# Patient Record
Sex: Female | Born: 1990 | Race: White | Hispanic: No | Marital: Single | Smoking: Current every day smoker
Health system: Southern US, Community
[De-identification: ages and names within clinical notes are randomized; demographics above are authoritative.]

## PROBLEM LIST (undated history)

## (undated) DIAGNOSIS — N63 Unspecified lump in unspecified breast: Secondary | ICD-10-CM

## (undated) HISTORY — DX: Unspecified lump in unspecified breast: N63.0

## (undated) NOTE — *Deleted (*Deleted)
    [facility]    Date of Admission:  06/15/2019      ID: Cambryn League is a 29 y.o. female with  MSSA disseminated disease with  Bacteremia, endocarditis, pulmonary septic emboli, splenic emboli and MSSA L knee septic arthritis -bacteremia cleared on 4/13 -- day 10 Active Problems:   Endocarditis   Hypokalemia   Septic arthritis (HCC)   Splenic infarct   Bacteremia due to methicillin susceptible Staphylococcus aureus (MSSA)   IV drug abuse (HCC)   Tobacco abuse   Positive hepatitis C antibody test    Subjective: Afebrile, feeling better, less left knee pain. She denies chest pain. Interested in getting home. She would be able to go back home to live with her mother, and sister. She has 3 young children under the age of 7  Medications:   Chlorhexidine Gluconate Cloth  6 each Topical Daily   enoxaparin (LOVENOX) injection  40 mg Subcutaneous Q24H   methadone  35 mg Oral Daily   nicotine  21 mg Transdermal Daily   saccharomyces boulardii  250 mg Oral BID    Objective: Vital signs in last 24 hours: Temp:  [98.3 F (36.8 C)-99.8 F (37.7 C)] 99.8 F (37.7 C) (04/22 1723) Pulse Rate:  [101-126] 123 (04/22 1723) Resp:  [16-19] 18 (04/22 1723) BP: (100-120)/(60-83) 115/73 (04/22 1723) SpO2:  [99 %-100 %] 100 % (04/22 1723)  Physical Exam  Constitutional:  oriented to person, place, and time. appears well-developed and well-nourished. No distress.  HENT: [place]/AT, PERRLA, no scleral icterus Mouth/Throat: Oropharynx is clear and moist. No oropharyngeal exudate.  Cardiovascular: Normal rate, regular rhythm and normal heart sounds. Exam reveals no gallop and no friction rub.  No murmur heard.  Pulmonary/Chest: Effort normal and breath sounds normal. No respiratory distress.  has no wheezes.  Neck = supple, no nuchal rigidity Abdominal: Soft. Bowel sounds are normal.  exhibits no distension. There is no tenderness.  Lymphadenopathy: no cervical adenopathy.  No axillary adenopathy Neurological: alert and oriented to person, place, and time.  Skin: Skin is warm and dry. No rash noted. No erythema.  Psychiatric: a normal mood and affect.  behavior is normal.    Lab Results Recent Labs    06/23/19 0831 06/24/19 0403  WBC 12.5* 11.1*  HGB 9.5* 9.7*  HCT 30.0* 30.8*  NA 139 141  K 3.8 3.5  CL 105 106  CO2 26 26  BUN 10 11  CREATININE 0.54 0.51    Microbiology: 4/13 blood cx NGTD Studies/Results: No results found.   Assessment/Plan:  currently day 10 since clearing bacteremia. Using 4/13 as day 1. Recommend that we re-evaluate on Monday, day 14, if can consider to be part of getting daily infusion via picc line, for management of endocarditis Vs if she is willing to stay through 4 wk, then finish with outpatient IV management  Opiate dependence = continue on methadone 35mg daily  Will d/c probiotics Cynthia Snider [facility] Cell: [phone] Pager: [phone]  06/25/2019, 5:34 PM      

## (undated) NOTE — *Deleted (*Deleted)
PROGRESS NOTE    Michelle Doyle  MRN:[id number] DOB: 06/01/1990 DOA: 06/15/2019 PCP: Patient, No Pcp Per    Chief Complaint  Patient presents with   Abnormal Lab    Brief Narrative: (Start on day 1 of progress note - keep it brief and live) 28 year old with PMH significant for IV heroin abuse, Untreated Hepatitis C, initially admitted for acute heroin withdrawal and sepsis. Patient was transfer to [place] 4/12 for TEE, but it was cancel due to severe anemia, Hb at 6. Patient Decided left AMA from [facility] Her Grandfather brought her back to [place] [facility].    Patient was found to have disseminated MSSA Bacteremia, mitral valve and tricuspid valve endocarditis, pulmonary septic emboli, splenic emboli, probable septic arthritis of the left knee.  ID following.    Assessment & Plan:   Active Problems:   Endocarditis   Hypokalemia   Septic arthritis (HCC)   Splenic infarct   Bacteremia due to methicillin susceptible Staphylococcus aureus (MSSA)   IV drug abuse (HCC)   Tobacco abuse   Positive hepatitis C antibody test  1 disseminated MSSA bacteremia, POA Patient noted to have disseminated MSSA bacteremia with pulmonary septic emboli, splenic emboli, probable septic arthritis of the left knee, tricuspid and mitral valve endocarditis.  MRI of the brain was obtained incomplete study patient refused to complete the study however no significant abnormality detected.  2D echo done with thickening tricuspid valve 1.1 x 1.7 cm vegetation, tricuspid valve mild to moderate regurgitation, thickened mitral valve cannot rule out vegetation, abnormal mitral valve.  Blood cultures from 06/11/2019 were growing staph aureus.  Repeat blood cultures 06/13/2019 with cocci in clusters.  Repeat cultures obtained 06/16/2019 with no growth to date.  Continue IV nafcillin will likely need total of 6 weeks of IV antibiotics.  ID following and appreciate input and recommendations.  2.  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  Patient with MSSA bacteremia.  Continue IV nafcillin.  Per ID.  3.  Septic emboli/splenic infarct Continue IV nafcillin.  We will give a dose of Lasix 40 mg IV x1.  4.  Left knee septic arthritis Status post I&D of the left knee.  Continue IV nafcillin.  Pain management.  Will ask orthopedics to reassess knee tomorrow.  Supportive care.  5.  Opiate abuse Patient uses IV heroin.  Patient states was at the methadone clinic and was getting methadone 35 mg daily and requesting her current dose be increased.  Patient currently on methadone 10 mg twice a day.  Pharmacy contacted methadone clinic and per pharmacist patient noted not to be in the system although patient adamant she goes there.  Patient asking for methadone dose to be increased back to 35 mg daily.  Methadone has been increased to 35 mg daily,  as patient with complaints of hot flashes, chills.  Continue the clonidine detox protocol. Continue current regimen of Percocet for pain as needed.  Will not escalate pain medication.  6.  Untreated/positive hepatitis C HCV RNA pending.  ID following.  7.  hypokalemia Repleted.  Potassium at 3.9.  Magnesium at 2.0.   8.  Tobacco abuse Continue nicotine patch.    9.  Thrombocytopenia Resolved.  10.  Iron deficiency anemia Status post 2 units packed red blood cells on 06/15/2019.  Hemoglobin currently stable at 9.9.  Will likely   need oral iron supplementation on discharge.  May consider IV iron during this hospitalization however will discuss with ID on Monday.    DVT prophylaxis: Lovenox Code Status: Full Family Communication: Updated patient.  No family at bedside.  Disposition:   Status is: Inpatient    Dispo: The patient is from: Home              Anticipated d/c is to: Likely home               Anticipated d/c date is: In approximately 5 weeks.              Patient currently on IV nafcillin and will need to complete 6 weeks of therapy prior to discharge as patient with IV drug abuse.        Consultants:  Infectious disease: Dr. Snider  Procedures:  MRI brain from 06/16/2019 PICC line  Antimicrobials:  IV nafcillin 06/15/2019 (D8/42) >>>>>   Subjective: Patient in bed.  Patient noted to have had a T-max of 100.8 earlier this morning.  Patient with some complaints of sacral pain.  Patient denies any discharge.  Patient concerned that she may have a UTI although she denies any significant dysuria.   Objective: Vitals:   06/20/19 2101 06/21/19 0514 06/21/19 0815 06/21/19 0937  BP: 117/72 123/65 107/68 118/77  Pulse: (!) 110 (!) 113 96 95  Resp: 18 18 19 18  Temp: 99 F (37.2 C) (!) 100.8 F (38.2 C) 99.5 F (37.5 C) 98.7 F (37.1 C)  TempSrc: Oral Oral Oral Oral  SpO2: 98% 97% 97% 96%  Weight:      Height:        Intake/Output Summary (Last 24 hours) at 06/21/2019 1304 Last data filed at 06/20/2019 1800 Gross per 24 hour  Intake 388.7 ml  Output 2300 ml  Net -1911.3 ml   Filed Weights   06/16/19 0110 06/16/19 0816  Weight: 49.9 kg 65.2 kg    Examination:  General exam: NAD Respiratory system: Decreased scattered coarse breath sounds/crackles.  Fair air movement.  Speaking in full sentences. Cardiovascular system: RRR no murmurs rubs or gallops.  No JVD.  No lower extremity edema.  Gastrointestinal system: Abdomen is soft, nontender, nondistended, positive bowel sounds.  No rebound.  No guarding. Central nervous system: Alert and oriented. No focal neurological deficits. Extremities: Left knee with dressing noted, no significant surrounding erythema.  Skin: No rashes, lesions or ulcers Psychiatry: Judgement and insight appear normal. Mood & affect appropriate.     Data Reviewed: I have personally reviewed following labs and imaging  studies  CBC: Recent Labs  Lab 06/15/19 1421 06/16/19 0500 06/17/19 1211 06/18/19 1032 06/19/19 1051 06/20/19 1107 06/21/19 0143  WBC 19.5*   < > 20.0* 17.3* 19.0* 17.5* 17.6*  NEUTROABS 16.3*  --  15.5* 13.7* 15.4* 13.7*  --   HGB 6.8*   < > 10.5* 10.6* 9.6* 9.5* 9.9*  HCT 20.9*   < > 30.8* 31.3* 29.6* 29.6* 30.2*  MCV 80.4   < > 80.6 82.6 85.8 86.3 86.0  PLT 232   < > 293 308 331 401* 466*   < > = values in this interval not displayed.    Basic Metabolic Panel: Recent Labs  Lab 06/17/19 1211 06/18/19 1032 06/19/19 1051 06/20/19 1107 06/21/19 0143  NA 139 140 142 139 137  K 3.0* 3.4* 3.5 3.4* 3.9  CL 108 109 108 107 103  CO2 23 24 26 28 27  GLUCOSE 91   111* 100* 103* 121*  BUN <5* <5* 6 7 9  CREATININE 0.45 0.46 0.46 0.46 0.57  CALCIUM 7.3* 7.4* 7.8* 7.7* 7.9*  MG 1.7 2.0 2.0 1.8 2.0    GFR: Estimated Creatinine Clearance: 103.6 mL/min (by C-G formula based on SCr of 0.57 mg/dL).  Liver Function Tests: Recent Labs  Lab 06/16/19 0500 06/17/19 1211  AST 20 18  ALT 11 12  ALKPHOS 72 61  BILITOT 0.9 0.8  PROT 6.7 6.4*  ALBUMIN 1.8* 1.6*    CBG: No results for input(s): GLUCAP in the last 168 hours.   Recent Results (from the past 240 hour(s))  Body fluid culture     Status: None   Collection Time: 06/11/19  2:03 PM   Specimen: Synovium; Synovial Fluid  Result Value Ref Range Status   Specimen Description SYNOVIAL FLUID KNEE LEFT  Final   Special Requests   Final    NONE Performed at [facility], [address] [place], [place] [address]    Gram Stain   Final    ABUNDANT WBC PRESENT,BOTH PMN AND MONONUCLEAR NO ORGANISMS SEEN Gram Stain Report Called to,Read Back By and Verified With: DR. SWINTECK AT 1512 ON 06/11/19 BY N.Wandy Bossler Performed at [facility], [address] [place], [place] [address]    Culture   Final    NO GROWTH 3 DAYS Performed at [facility], [address] [place], [place]  [address]    Report Status 06/14/2019 FINAL  Final  Anaerobic culture     Status: None   Collection Time: 06/11/19  2:03 PM   Specimen: Synovium; Synovial Fluid  Result Value Ref Range Status   Specimen Description SYNOVIAL FLUID KNEE LEFT  Final   Special Requests   Final    NONE Performed at [facility], [address] [place], [place] [address]    Culture   Final    NO ANAEROBES ISOLATED Performed at [facility], [address] [place], [place] [address]    Report Status 06/16/2019 FINAL  Final  Aerobic/Anaerobic Culture (surgical/deep wound)     Status: None   Collection Time: 06/11/19  6:30 PM   Specimen: Synovial, Left Knee; Body Fluid  Result Value Ref Range Status   Specimen Description   Final    SYNOVIAL KNEE LEFT Performed at [facility], [address] [place], [place] [address]    Special Requests   Final    NONE Performed at [facility], [address] [place], [place] [address]    Gram Stain   Final    RARE WBC PRESENT, PREDOMINANTLY PMN NO ORGANISMS SEEN    Culture   Final    RARE STAPHYLOCOCCUS AUREUS NO ANAEROBES ISOLATED Performed at [facility], [address] [place], [place] [address]    Report Status 06/16/2019 FINAL  Final   Organism ID, Bacteria STAPHYLOCOCCUS AUREUS  Final      Susceptibility   Staphylococcus aureus - MIC*    CIPROFLOXACIN >=8 RESISTANT Resistant     ERYTHROMYCIN >=8 RESISTANT Resistant     GENTAMICIN <=0.5 SENSITIVE Sensitive     OXACILLIN 0.5 SENSITIVE Sensitive     TETRACYCLINE <=1 SENSITIVE Sensitive     VANCOMYCIN <=0.5 SENSITIVE Sensitive     TRIMETH/SULFA <=10 SENSITIVE Sensitive     CLINDAMYCIN <=0.25 SENSITIVE Sensitive     RIFAMPIN <=0.5 SENSITIVE Sensitive     Inducible Clindamycin NEGATIVE Sensitive     *   RARE STAPHYLOCOCCUS AUREUS  Aerobic/Anaerobic Culture (surgical/deep wound)     Status: None   Collection Time: 06/11/19  6:32 PM    Specimen: Synovium; Tissue  Result Value Ref Range Status   Specimen Description   Final    SYNOVIAL TISSUE KNEE LEFT Performed at [facility], [address] [place], [place] [address]    Special Requests   Final    NONE Performed at [facility], [address] [place], [place] [address]    Gram Stain NO WBC SEEN NO ORGANISMS SEEN   Final   Culture   Final    RARE STAPHYLOCOCCUS AUREUS NO ANAEROBES ISOLATED Performed at [facility], [address] [place], [place] [address]    Report Status 06/17/2019 FINAL  Final   Organism ID, Bacteria STAPHYLOCOCCUS AUREUS  Final      Susceptibility   Staphylococcus aureus - MIC*    CIPROFLOXACIN >=8 RESISTANT Resistant     ERYTHROMYCIN >=8 RESISTANT Resistant     GENTAMICIN <=0.5 SENSITIVE Sensitive     OXACILLIN 0.5 SENSITIVE Sensitive     TETRACYCLINE <=1 SENSITIVE Sensitive     VANCOMYCIN 1 SENSITIVE Sensitive     TRIMETH/SULFA <=10 SENSITIVE Sensitive     CLINDAMYCIN <=0.25 SENSITIVE Sensitive     RIFAMPIN <=0.5 SENSITIVE Sensitive     Inducible Clindamycin NEGATIVE Sensitive     * RARE STAPHYLOCOCCUS AUREUS  Surgical PCR screen     Status: Abnormal   Collection Time: 06/11/19  8:49 PM   Specimen: Nasal Mucosa; Nasal Swab  Result Value Ref Range Status   MRSA, PCR NEGATIVE NEGATIVE Final   Staphylococcus aureus POSITIVE (A) NEGATIVE Final    Comment: (NOTE) The Xpert SA Assay (FDA approved for NASAL specimens in patients 22 years of age and older), is one component of a comprehensive surveillance program. It is not intended to diagnose infection nor to guide or monitor treatment. Performed at [facility], [address] [place], [place] [address]   Culture, blood (routine x 2)     Status: Abnormal   Collection Time: 06/13/19 11:53 AM   Specimen: BLOOD LEFT HAND  Result Value Ref Range Status   Specimen Description   Final    BLOOD LEFT HAND Performed  at [facility], [address] [place], [place] [address]    Special Requests   Final    BOTTLES DRAWN AEROBIC AND ANAEROBIC Blood Culture adequate volume Performed at  [facility], [address] [place], [place] [address]    Culture  Setup Time   Final    GRAM POSITIVE COCCI IN CLUSTERS AEROBIC BOTTLE ONLY CRITICAL RESULT CALLED TO, READ BACK BY AND VERIFIED WITH: PHARMD C SHADE [id number] AT 954 AM BY CM    Culture (A)  Final    STAPHYLOCOCCUS AUREUS SUSCEPTIBILITIES PERFORMED ON PREVIOUS CULTURE WITHIN THE LAST 5 DAYS. Performed at  [facility], [address] [place], [place] [address]    Report Status 06/15/2019 FINAL  Final  Culture, blood (routine x 2)     Status: Abnormal   Collection Time: 06/13/19 11:53 AM   Specimen: BLOOD RIGHT HAND  Result Value Ref Range Status   Specimen Description   Final    BLOOD RIGHT HAND Performed at  [facility], [address] [place], [place] [address]    Special Requests   Final    BOTTLES DRAWN AEROBIC AND ANAEROBIC Blood Culture results may   not be optimal due to an inadequate volume of blood received in culture bottles Performed at [facility], [address] [place], [place] [address]    Culture  Setup Time   Final    GRAM POSITIVE COCCI IN CLUSTERS AEROBIC BOTTLE ONLY CRITICAL RESULT CALLED TO, READ BACK BY AND VERIFIED WITH: PHARMD C SHADE [id number] AT 954 AM BY CM    Culture (A)  Final    STAPHYLOCOCCUS AUREUS SUSCEPTIBILITIES PERFORMED ON PREVIOUS CULTURE WITHIN THE LAST 5 DAYS. Performed at [facility], [address] [place], [place] [address]    Report Status 06/15/2019 FINAL  Final  Culture, blood (Routine X 2) w Reflex to ID Panel     Status: None   Collection Time: 06/16/19  4:33 PM   Specimen: BLOOD LEFT HAND  Result Value Ref Range Status   Specimen Description   Final    BLOOD LEFT HAND Performed at [facility], [address] [place], [place] [address]    Special Requests   Final    BOTTLES DRAWN AEROBIC ONLY Blood Culture adequate volume Performed at [facility], [address] [place], [place] [address]    Culture   Final    NO GROWTH 5 DAYS Performed at [facility], [address] [place], [place] [address]    Report Status 06/21/2019 FINAL  Final  Culture, blood (Routine X 2) w Reflex to ID Panel     Status: None   Collection Time: 06/16/19  4:40 PM   Specimen: BLOOD LEFT HAND  Result Value Ref Range Status   Specimen Description   Final    BLOOD LEFT HAND Performed at [facility], [address] [place], [place] [address]    Special Requests   Final    BOTTLES DRAWN AEROBIC ONLY Blood Culture results may not be optimal due to an inadequate volume of blood received in culture bottles Performed at [facility], [address] [place], [place] [address]    Culture   Final    NO GROWTH 5 DAYS Performed at [facility], [address] [place], [place] [address]    Report Status 06/21/2019 FINAL  Final         Radiology Studies: No results found.      Scheduled Meds:  Chlorhexidine Gluconate Cloth  6 each Topical Daily   enoxaparin (LOVENOX) injection  40 mg Subcutaneous Q24H   methadone  35 mg Oral Daily   nicotine  21 mg Transdermal Daily   saccharomyces boulardii  250 mg Oral BID   sodium chloride flush  10-40 mL Intracatheter Q12H   Continuous Infusions:  nafcillin (NAFCIL) continuous infusion 12 g (06/20/19 1555)     LOS: 6 days    Time spent: 35 minutes    Kia Stavros, MD [facility]   To contact the attending provider between 7A-7P or the covering provider during after hours 7P-7A, please log into the web site [email or web address] and access using universal [place] password for that web site. If you do not have the password, please call the hospital  operator.  06/21/2019, 1:04 PM   

## (undated) NOTE — *Deleted (*Deleted)
PROGRESS NOTE    Michelle Doyle  MRN:[id number] DOB: 07/18/1990 DOA: 06/15/2019 PCP: Patient, No Pcp Per    Chief Complaint  Patient presents with   Abnormal Lab    Brief Narrative: (Start on day 1 of progress note - keep it brief and live) 28 year old with PMH significant for IV heroin abuse, Untreated Hepatitis C, initially admitted for acute heroin withdrawal and sepsis. Patient was transfer to [facility] 4/12 for TEE, but it was cancel due to severe anemia, Hb at 6. Patient Decided left AMA from [facility] Her Grandfather brought her back to [facility].    Patient was found to have disseminated MSSA Bacteremia, mitral valve and tricuspid valve endocarditis, pulmonary septic emboli, splenic emboli, probable septic arthritis of the left knee.  ID following.    Assessment & Plan:   Active Problems:   Endocarditis   Hypokalemia   Septic arthritis (HCC)   Splenic infarct   Bacteremia due to methicillin susceptible Staphylococcus aureus (MSSA)   IV drug abuse (HCC)   Tobacco abuse   Positive hepatitis C antibody test  1 disseminated MSSA bacteremia, POA Patient noted to have disseminated MSSA bacteremia with pulmonary septic emboli, splenic emboli, probable septic arthritis of the left knee, tricuspid and mitral valve endocarditis.  MRI of the brain was obtained incomplete study patient refused to complete the study however no significant abnormality detected.  2D echo done with thickening tricuspid valve 1.1 x 1.7 cm vegetation, tricuspid valve mild to moderate regurgitation, thickened mitral valve cannot rule out vegetation, abnormal mitral valve.  Blood cultures from 06/11/2019 were growing staph aureus.  Repeat blood cultures 06/13/2019 with cocci in clusters.  Repeat cultures obtained 06/16/2019 with no growth to date.  Continue IV nafcillin will likely need total of 6 weeks of IV antibiotics.  ID following and appreciate input and recommendations.  2.  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  Patient with MSSA bacteremia.  Continue IV nafcillin.  Per ID.  3.  Septic emboli/splenic infarct Continue IV nafcillin.  4.  Left knee septic arthritis Status post I&D of the left knee.  Continue IV nafcillin.  Pain management.  Will ask orthopedics to reassess knee tomorrow.  Supportive care.  5.  Opiate abuse Patient uses IV heroin.  Patient states was at the methadone clinic and was getting methadone 35 mg daily and requesting her current dose be increased.  Patient currently on methadone 10 mg twice a day.  Pharmacy contacted methadone clinic and per pharmacist patient noted not to be in the system although patient adamant she goes there.  Patient asking for methadone dose to be increased back to 35 mg daily.  Increase methadone to 35 mg daily as patient with complaints of hot flashes, chills.  Continue the clonidine detox protocol. Continue current regimen of Percocet for pain as needed.    6.  Untreated/positive hepatitis C HCV RNA pending.  ID following.  7.  hypokalemia Potassium at 3.4 this morning.  K. Dur 40 mEq p.o. x1.  Magnesium currently at 1.8.  Follow.    8.  Tobacco abuse Nicotine patch.    9.  Thrombocytopenia Resolved.  10.  Iron deficiency anemia Status post 2 units packed red blood cells on 06/15/2019.  Hemoglobin currently stable at 9.5.  Will likely need oral iron supplementation on discharge.  May consider   IV iron during this hospitalization however will discuss with ID on Monday.    DVT prophylaxis: Lovenox Code Status: Full Family Communication: Updated patient.  No family at bedside.  Disposition:   Status is: Inpatient    Dispo: The patient is from: Home              Anticipated d/c is to: Likely home              Anticipated d/c date is: In approximately 5 weeks.               Patient currently on IV nafcillin and will need to complete 6 weeks of therapy prior to discharge as patient with IV drug abuse.        Consultants:  Infectious disease: Dr. Snider  Procedures:  MRI brain from 06/16/2019 PICC line  Antimicrobials:  IV nafcillin 06/15/2019 (D8/42) >>>>>   Subjective: Patient in bed.  States she had a fever overnight and noted to have a T-max of 101.6.  Still with some chills.  Denies any nausea or vomiting.  Tolerating oral intake.  Denies any significant shortness of breath.  Denies any chest pain.  Appreciate above methadone dose being increased to 35 mg this morning.    Objective: Vitals:   06/19/19 1544 06/19/19 2030 06/20/19 0435 06/20/19 0512  BP: 112/74 128/81 (!) 88/68 113/69  Pulse: 98 (!) 110 (!) 113 (!) 108  Resp: 20 18 18   Temp: 98.8 F (37.1 C) 99.9 F (37.7 C) (!) 101.6 F (38.7 C) (!) 100.7 F (38.2 C)  TempSrc: Oral Oral Oral Oral  SpO2: 100% 96% 97% 96%  Weight:      Height:        Intake/Output Summary (Last 24 hours) at 06/20/2019 1304 Last data filed at 06/19/2019 1400 Gross per 24 hour  Intake 240 ml  Output --  Net 240 ml   Filed Weights   06/16/19 0110 06/16/19 0816  Weight: 49.9 kg 65.2 kg    Examination:  General exam: NAD Respiratory system: Some scattered coarse breath sounds/crackles fair air movement.   Cardiovascular system: Regular rate rhythm no murmurs rubs or gallops.  No JVD.  No lower extremity edema. Gastrointestinal system: Abdomen is nontender, nondistended, soft, positive bowel sounds.  No rebound.  No guarding.  Central nervous system: Alert and oriented. No focal neurological deficits. Extremities: Left knee bandaged.  Skin: No rashes, lesions or ulcers Psychiatry: Judgement and insight appear normal. Mood & affect appropriate.     Data Reviewed: I have personally reviewed following labs and imaging studies  CBC: Recent Labs  Lab 06/15/19 1421 06/15/19 1421 06/16/19 0500  06/17/19 1211 06/18/19 1032 06/19/19 1051 06/20/19 1107  WBC 19.5*   < > 19.5* 20.0* 17.3* 19.0* 17.5*  NEUTROABS 16.3*  --   --  15.5* 13.7* 15.4* 13.7*  HGB 6.8*   < > 11.7* 10.5* 10.6* 9.6* 9.5*  HCT 20.9*   < > 35.7* 30.8* 31.3* 29.6* 29.6*  MCV 80.4   < > 84.8 80.6 82.6 85.8 86.3  PLT 232   < > 268 293 308 331 401*   < > = values in this interval not displayed.    Basic Metabolic Panel: Recent Labs  Lab 06/15/19 1421 06/15/19 1520 06/16/19 0500 06/17/19 1211 06/18/19 1032 06/19/19 1051 06/20/19 1107  NA   < >  --  140 139 140 142 139  K   < >  --  3.9 3.0* 3.4* 3.5 3.4*    CL   < >  --  108 108 109 108 107  CO2   < >  --  22 23 24 26 28  GLUCOSE   < >  --  102* 91 111* 100* 103*  BUN   < >  --  <5* <5* <5* 6 7  CREATININE   < >  --  0.51 0.45 0.46 0.46 0.46  CALCIUM   < >  --  7.5* 7.3* 7.4* 7.8* 7.7*  MG  --  2.0  --  1.7 2.0 2.0 1.8   < > = values in this interval not displayed.    GFR: Estimated Creatinine Clearance: 103.6 mL/min (by C-G formula based on SCr of 0.46 mg/dL).  Liver Function Tests: Recent Labs  Lab 06/16/19 0500 06/17/19 1211  AST 20 18  ALT 11 12  ALKPHOS 72 61  BILITOT 0.9 0.8  PROT 6.7 6.4*  ALBUMIN 1.8* 1.6*    CBG: No results for input(s): GLUCAP in the last 168 hours.   Recent Results (from the past 240 hour(s))  Culture, blood (Routine x 2)     Status: Abnormal   Collection Time: 06/11/19 12:05 AM   Specimen: BLOOD  Result Value Ref Range Status   Specimen Description   Final    BLOOD BLOOD LEFT HAND Performed at [facility], [address] [place], [place] [address]    Special Requests   Final    BOTTLES DRAWN AEROBIC ONLY Blood Culture adequate volume Performed at [facility], [address] [place], [place] [address]    Culture  Setup Time   Final    GRAM POSITIVE COCCI IN CLUSTERS AEROBIC BOTTLE ONLY CRITICAL VALUE NOTED.  VALUE IS CONSISTENT WITH PREVIOUSLY REPORTED AND  CALLED VALUE.    Culture (A)  Final    STAPHYLOCOCCUS AUREUS SUSCEPTIBILITIES PERFORMED ON PREVIOUS CULTURE WITHIN THE LAST 5 DAYS. Performed at [facility], [address] [place], [place] [address]    Report Status 06/13/2019 FINAL  Final  Urine culture     Status: Abnormal   Collection Time: 06/11/19 12:05 AM   Specimen: In/Out Cath Urine  Result Value Ref Range Status   Specimen Description   Final    IN/OUT CATH URINE Performed at [facility], [address] [place], [place] [address]    Special Requests   Final    NONE Performed at [facility], [address] [place], [place] [address]    Culture (A)  Final    >=100,000 COLONIES/mL KOCURIA SPECIES 10,000 COLONIES/mL STAPHYLOCOCCUS AUREUS Standardized susceptibility testing for this organism is not available. FOR KOCURIA SPECIES Performed at [facility], [address] , [place] [address]    Report Status 06/13/2019 FINAL  Final   Organism ID, Bacteria STAPHYLOCOCCUS AUREUS (A)  Final      Susceptibility   Staphylococcus aureus - MIC*    CIPROFLOXACIN >=8 RESISTANT Resistant     GENTAMICIN <=0.5 SENSITIVE Sensitive     NITROFURANTOIN <=16 SENSITIVE Sensitive     OXACILLIN 0.5 SENSITIVE Sensitive     TETRACYCLINE <=1 SENSITIVE Sensitive     VANCOMYCIN 1 SENSITIVE Sensitive     TRIMETH/SULFA <=10 SENSITIVE Sensitive     CLINDAMYCIN <=0.25 SENSITIVE Sensitive     RIFAMPIN <=0.5 SENSITIVE Sensitive     Inducible Clindamycin NEGATIVE Sensitive     * 10,000 COLONIES/mL STAPHYLOCOCCUS AUREUS  Culture, blood (Routine x 2)       Status: Abnormal   Collection Time: 06/11/19 12:10 AM   Specimen: BLOOD  Result Value Ref Range Status   Specimen Description   Final    BLOOD BLOOD RIGHT FOREARM Performed at [facility], [address] [place], [place] [address]    Special Requests   Final    BOTTLES DRAWN AEROBIC ONLY Blood Culture adequate  volume [facility] at  [facility], [address] [place], [place] [address]    Culture  Setup Time   Final    GRAM POSITIVE COCCI IN CLUSTERS AEROBIC BOTTLE ONLY CRITICAL RESULT CALLED TO, READ BACK BY AND VERIFIED WITH: PHARMD MICHELLE B. [id number] FCP Performed at [facility], [address] [place], [place] [address]    Culture STAPHYLOCOCCUS AUREUS (A)  Final   Report Status 06/13/2019 FINAL  Final   Organism ID, Bacteria STAPHYLOCOCCUS AUREUS  Final      Susceptibility   Staphylococcus aureus - MIC*    CIPROFLOXACIN >=8 RESISTANT Resistant     ERYTHROMYCIN >=8 RESISTANT Resistant     GENTAMICIN <=0.5 SENSITIVE Sensitive     OXACILLIN 0.5 SENSITIVE Sensitive     TETRACYCLINE <=1 SENSITIVE Sensitive     VANCOMYCIN 1 SENSITIVE Sensitive     TRIMETH/SULFA <=10 SENSITIVE Sensitive     CLINDAMYCIN <=0.25 SENSITIVE Sensitive     RIFAMPIN <=0.5 SENSITIVE Sensitive     Inducible Clindamycin NEGATIVE Sensitive     * STAPHYLOCOCCUS AUREUS  Blood Culture ID Panel (Reflexed)     Status: Abnormal   Collection Time: 06/11/19 12:10 AM  Result Value Ref Range Status   Enterococcus species NOT DETECTED NOT DETECTED Final   Listeria monocytogenes NOT DETECTED NOT DETECTED Final   Staphylococcus species DETECTED (A) NOT DETECTED Final    Comment: CRITICAL RESULT CALLED TO, READ BACK BY AND VERIFIED WITH: PHARMD MICHELLE B. [id number] FCP    Staphylococcus aureus (BCID) DETECTED (A) NOT DETECTED Final    Comment: Methicillin (oxacillin) susceptible Staphylococcus aureus (MSSA). Preferred therapy is anti staphylococcal beta lactam antibiotic (Cefazolin or Nafcillin), unless clinically contraindicated. CRITICAL RESULT CALLED TO, READ BACK BY AND VERIFIED WITH: PHARMD MICHELLE B. [phone] [id number] FCP    Methicillin resistance NOT DETECTED NOT DETECTED Final   Streptococcus species NOT DETECTED NOT DETECTED Final   Streptococcus agalactiae NOT DETECTED NOT DETECTED  Final   Streptococcus pneumoniae NOT DETECTED NOT DETECTED Final   Streptococcus pyogenes NOT DETECTED NOT DETECTED Final   Acinetobacter baumannii NOT DETECTED NOT DETECTED Final   Enterobacteriaceae species NOT DETECTED NOT DETECTED Final   Enterobacter cloacae complex NOT DETECTED NOT DETECTED Final   Escherichia coli NOT DETECTED NOT DETECTED Final   Klebsiella oxytoca NOT DETECTED NOT DETECTED Final   Klebsiella pneumoniae NOT DETECTED NOT DETECTED Final   Proteus species NOT DETECTED NOT DETECTED Final   Serratia marcescens NOT DETECTED NOT DETECTED Final   Haemophilus influenzae NOT DETECTED NOT DETECTED Final   Neisseria meningitidis NOT DETECTED NOT DETECTED Final   Pseudomonas aeruginosa NOT DETECTED NOT DETECTED Final   Candida albicans NOT DETECTED NOT DETECTED Final   Candida glabrata NOT DETECTED NOT DETECTED Final   Candida krusei NOT DETECTED NOT DETECTED Final   Candida parapsilosis NOT DETECTED NOT DETECTED Final   Candida tropicalis NOT DETECTED NOT DETECTED Final    Comment: Performed at [facility], [address] [place], [place] [address]  SARS CORONAVIRUS 2 (TAT 6-24 HRS) Nasopharyngeal Nasopharyngeal Swab     Status:   None   Collection Time: 06/11/19 12:52 AM   Specimen: Nasopharyngeal Swab  Result Value Ref Range Status   SARS Coronavirus 2 NEGATIVE NEGATIVE Final    Comment: (NOTE) SARS-CoV-2 target nucleic acids are NOT DETECTED. The SARS-CoV-2 RNA is generally detectable in upper and lower respiratory specimens during the acute phase of infection. Negative results do not preclude SARS-CoV-2 infection, do not rule out co-infections with other pathogens, and should not be used as the sole basis for treatment or other patient management decisions. Negative results must be combined with clinical observations, patient history, and epidemiological information. The expected result is Negative. Fact Sheet for  Patients: https://www.fda.gov/media/138098/download Fact Sheet for Healthcare Providers: https://www.fda.gov/media/138095/download This test is not yet approved or cleared by the [place] FDA and  has been authorized for detection and/or diagnosis of SARS-CoV-2 by FDA under an Emergency Use Authorization (EUA). This EUA will remain  in effect (meaning this test can be used) for the duration of the COVID-19 declaration under Section 56 4(b)(1) of the Act, 21 U.S.C. section 360bbb-3(b)(1), unless the authorization is terminated or revoked sooner. Performed at [facility], [address] [place], [place] [address]   Respiratory Panel by RT PCR (Flu A&B, Covid) - Nasopharyngeal Swab     Status: None   Collection Time: 06/11/19  5:25 AM   Specimen: Nasopharyngeal Swab  Result Value Ref Range Status   SARS Coronavirus 2 by RT PCR NEGATIVE NEGATIVE Final    Comment: (NOTE) SARS-CoV-2 target nucleic acids are NOT DETECTED. The SARS-CoV-2 RNA is generally detectable in upper respiratoy specimens during the acute phase of infection. The lowest concentration of SARS-CoV-2 viral copies this assay can detect is 131 copies/mL. A negative result does not preclude SARS-Cov-2 infection and should not be used as the sole basis for treatment or other patient management decisions. A negative result may occur with  improper specimen collection/handling, submission of specimen other than nasopharyngeal swab, presence of viral mutation(s) within the areas targeted by this assay, and inadequate number of viral copies (<131 copies/mL). A negative result must be combined with clinical observations, patient history, and epidemiological information. The expected result is Negative. Fact Sheet for Patients:  https://www.fda.gov/media/142436/download Fact Sheet for Healthcare Providers:  https://www.fda.gov/media/142435/download This test is not yet ap proved or cleared by the [place] FDA  and  has been authorized for detection and/or diagnosis of SARS-CoV-2 by FDA under an Emergency Use Authorization (EUA). This EUA will remain  in effect (meaning this test can be used) for the duration of the COVID-19 declaration under Section 564(b)(1) of the Act, 21 U.S.C. section 360bbb-3(b)(1), unless the authorization is terminated or revoked sooner.    Influenza A by PCR NEGATIVE NEGATIVE Final   Influenza B by PCR NEGATIVE NEGATIVE Final    Comment: (NOTE) The Xpert Xpress SARS-CoV-2/FLU/RSV assay is intended as an aid in  the diagnosis of influenza from Nasopharyngeal swab specimens and  should not be used as a sole basis for treatment. Nasal washings and  aspirates are unacceptable for Xpert Xpress SARS-CoV-2/FLU/RSV  testing. Fact Sheet for Patients: https://www.fda.gov/media/142436/download Fact Sheet for Healthcare Providers: https://www.fda.gov/media/142435/download This test is not yet approved or cleared by the [place] FDA and  has been authorized for detection and/or diagnosis of SARS-CoV-2 by  FDA under an Emergency Use Authorization (EUA). This EUA will remain  in effect (meaning this test can be used) for the duration of the  Covid-19 declaration under Section 564(b)(1) of the Act, 21  U.S.C.   section 360bbb-3(b)(1), unless the authorization is  terminated or revoked. Performed at [facility], [address] [place], [place] [address]   Body fluid culture     Status: None   Collection Time: 06/11/19  2:03 PM   Specimen: Synovium; Synovial Fluid  Result Value Ref Range Status   Specimen Description SYNOVIAL FLUID KNEE LEFT  Final   Special Requests   Final    NONE Performed at [facility], [address] [place], [place] [address]    Gram Stain   Final    ABUNDANT WBC PRESENT,BOTH PMN AND MONONUCLEAR NO ORGANISMS SEEN Gram Stain Report Called to,Read Back By and Verified With: DR. SWINTECK AT 1512 ON 06/11/19 BY  N.Nevah Dalal Performed at [facility], [address] [place], [place] [address]    Culture   Final    NO GROWTH 3 DAYS Performed at [facility], [address] [place], [place] [address]    Report Status 06/14/2019 FINAL  Final  Anaerobic culture     Status: None   Collection Time: 06/11/19  2:03 PM   Specimen: Synovium; Synovial Fluid  Result Value Ref Range Status   Specimen Description SYNOVIAL FLUID KNEE LEFT  Final   Special Requests   Final    NONE Performed at [facility], [address] [place], [place] [address]    Culture   Final    NO ANAEROBES ISOLATED Performed at [facility], [address] [place], [place] [address]    Report Status 06/16/2019 FINAL  Final  Aerobic/Anaerobic Culture (surgical/deep wound)     Status: None   Collection Time: 06/11/19  6:30 PM   Specimen: Synovial, Left Knee; Body Fluid  Result Value Ref Range Status   Specimen Description   Final    SYNOVIAL KNEE LEFT Performed at [facility], [address] [place], [place] [address]    Special Requests   Final    NONE Performed at [facility], [address] [place], [place] [address]    Gram Stain   Final    RARE WBC PRESENT, PREDOMINANTLY PMN NO ORGANISMS SEEN    Culture   Final    RARE STAPHYLOCOCCUS AUREUS NO ANAEROBES ISOLATED Performed at [facility], [address] [place], [place] [address]    Report Status 06/16/2019 FINAL  Final   Organism ID, Bacteria STAPHYLOCOCCUS AUREUS  Final      Susceptibility   Staphylococcus aureus - MIC*    CIPROFLOXACIN >=8 RESISTANT Resistant     ERYTHROMYCIN >=8 RESISTANT Resistant     GENTAMICIN <=0.5 SENSITIVE Sensitive     OXACILLIN 0.5 SENSITIVE Sensitive     TETRACYCLINE <=1 SENSITIVE Sensitive     VANCOMYCIN <=0.5 SENSITIVE Sensitive     TRIMETH/SULFA <=10 SENSITIVE Sensitive     CLINDAMYCIN <=0.25 SENSITIVE Sensitive     RIFAMPIN  <=0.5 SENSITIVE Sensitive     Inducible Clindamycin NEGATIVE Sensitive     * RARE STAPHYLOCOCCUS AUREUS  Aerobic/Anaerobic Culture (surgical/deep wound)     Status: None   Collection Time: 06/11/19  6:32 PM   Specimen: Synovium; Tissue  Result Value Ref Range Status   Specimen Description   Final    SYNOVIAL TISSUE KNEE LEFT Performed at [facility], [address] , [place] [address]    Special Requests   Final    NONE Performed at  [facility], [address] [place], [place] [address]    Gram Stain NO WBC SEEN NO ORGANISMS SEEN   Final   Culture   Final    RARE STAPHYLOCOCCUS AUREUS NO ANAEROBES ISOLATED Performed at [facility], [address] [place], [place] [address]    Report Status 06/17/2019 FINAL  Final   Organism ID, Bacteria STAPHYLOCOCCUS AUREUS  Final      Susceptibility   Staphylococcus aureus - MIC*    CIPROFLOXACIN >=8 RESISTANT Resistant     ERYTHROMYCIN >=8 RESISTANT Resistant     GENTAMICIN <=0.5 SENSITIVE Sensitive     OXACILLIN 0.5 SENSITIVE Sensitive     TETRACYCLINE <=1 SENSITIVE Sensitive     VANCOMYCIN 1 SENSITIVE Sensitive     TRIMETH/SULFA <=10 SENSITIVE Sensitive     CLINDAMYCIN <=0.25 SENSITIVE Sensitive     RIFAMPIN <=0.5 SENSITIVE Sensitive     Inducible Clindamycin NEGATIVE Sensitive     * RARE STAPHYLOCOCCUS AUREUS  Surgical PCR screen     Status: Abnormal   Collection Time: 06/11/19  8:49 PM   Specimen: Nasal Mucosa; Nasal Swab  Result Value Ref Range Status   MRSA, PCR NEGATIVE NEGATIVE Final   Staphylococcus aureus POSITIVE (A) NEGATIVE Final    Comment: (NOTE) The Xpert SA Assay (FDA approved for NASAL specimens in patients 22 years of age and older), is one component of a comprehensive surveillance program. It is not intended to diagnose infection nor to guide or monitor treatment. Performed at [facility], [address] [place], [place] [address]    Culture, blood (routine x 2)     Status: Abnormal   Collection Time: 06/13/19 11:53 AM   Specimen: BLOOD LEFT HAND  Result Value Ref Range Status   Specimen Description   Final    BLOOD LEFT HAND Performed at [facility], [address] [place], [place] [address]    Special Requests   Final    BOTTLES DRAWN AEROBIC AND ANAEROBIC Blood Culture adequate volume Performed at [facility], [address] [place], [place] [address]    Culture  Setup Time   Final    GRAM POSITIVE COCCI IN CLUSTERS AEROBIC BOTTLE ONLY CRITICAL RESULT CALLED TO, READ BACK BY AND VERIFIED WITH: PHARMD C SHADE [id number] AT 954 AM BY CM    Culture (A)  Final    STAPHYLOCOCCUS AUREUS SUSCEPTIBILITIES PERFORMED ON PREVIOUS CULTURE WITHIN THE LAST 5 DAYS. Performed at [facility], [address] [place], [place] [address]    Report Status 06/15/2019 FINAL  Final  Culture, blood (routine x 2)     Status: Abnormal   Collection Time: 06/13/19 11:53 AM   Specimen: BLOOD RIGHT HAND  Result Value Ref Range Status   Specimen Description   Final    BLOOD RIGHT HAND Performed at [facility], [address] [place], [place] [address]    Special Requests   Final    BOTTLES DRAWN AEROBIC AND ANAEROBIC Blood Culture results may not be optimal due to an inadequate volume of blood received in culture bottles Performed at [facility], [address] [place], [place] [address]    Culture  Setup Time   Final    GRAM POSITIVE COCCI IN CLUSTERS AEROBIC BOTTLE ONLY CRITICAL RESULT CALLED TO, READ BACK BY AND VERIFIED WITH: PHARMD C SHADE [id number] AT 954 AM BY CM    Culture (A)  Final    STAPHYLOCOCCUS AUREUS SUSCEPTIBILITIES   PERFORMED ON PREVIOUS CULTURE WITHIN THE LAST 5 DAYS. Performed at [facility], [address] [place], [place] [address]    Report Status 06/15/2019 FINAL  Final  Culture, blood (Routine X 2) w Reflex to ID  Panel     Status: None (Preliminary result)   Collection Time: 06/16/19  4:33 PM   Specimen: BLOOD LEFT HAND  Result Value Ref Range Status   Specimen Description   Final    BLOOD LEFT HAND Performed at [facility], [address] [place], [place] [address]    Special Requests   Final    BOTTLES DRAWN AEROBIC ONLY Blood Culture adequate volume Performed at [facility], [address] [place], [place] [address]    Culture   Final    NO GROWTH 4 DAYS Performed at [facility], [address] [place], [place] [address]    Report Status PENDING  Incomplete  Culture, blood (Routine X 2) w Reflex to ID Panel     Status: None (Preliminary result)   Collection Time: 06/16/19  4:40 PM   Specimen: BLOOD LEFT HAND  Result Value Ref Range Status   Specimen Description   Final    BLOOD LEFT HAND Performed at [facility], [address] [place], [place] [address]    Special Requests   Final    BOTTLES DRAWN AEROBIC ONLY Blood Culture results may not be optimal due to an inadequate volume of blood received in culture bottles Performed at [facility], [address] [place], [place] [address]    Culture   Final    NO GROWTH 4 DAYS Performed at [facility], [address] [place],  [address]    Report Status PENDING  Incomplete         Radiology Studies: US EKG SITE RITE  Result Date: 06/18/2019 If Site Rite image not attached, placement could not be confirmed due to current cardiac rhythm.       Scheduled Meds:  Chlorhexidine Gluconate Cloth  6 each Topical Daily   enoxaparin (LOVENOX) injection  40 mg Subcutaneous Q24H   furosemide  40 mg Intravenous Once   methadone  35 mg Oral Daily   nicotine  21 mg Transdermal Daily   potassium chloride  40 mEq Oral Once   saccharomyces boulardii  250 mg Oral BID   sodium chloride flush  10-40 mL Intracatheter Q12H   Continuous  Infusions:  magnesium sulfate bolus IVPB     nafcillin (NAFCIL) continuous infusion 12 g (06/19/19 1633)     LOS: 5 days    Time spent: 35 minutes    Tieara Flitton, MD [facility]   To contact the attending provider between 7A-7P or the covering provider during after hours 7P-7A, please log into the web site [email or web address] and access using universal [place] password for that web site. If you do not have the password, please call the hospital operator.  06/20/2019, 1:04 PM   

## (undated) NOTE — *Deleted (*Deleted)
    [facility]    Date of Admission:  06/15/2019   Total days of antibiotics 14           ID: Michelle Doyle is a 29 y.o. female with   Active Problems:   Endocarditis   Hypokalemia   Septic arthritis (HCC)   Splenic infarct   Bacteremia due to methicillin susceptible Staphylococcus aureus (MSSA)   IV drug abuse (HCC)   Tobacco abuse   Positive hepatitis C antibody test    Subjective: Persistent fever for the past 4 days, but leukocytosis improved  Medications:   Chlorhexidine Gluconate Cloth  6 each Topical Daily   enoxaparin (LOVENOX) injection  40 mg Subcutaneous Q24H   methadone  35 mg Oral Daily   nicotine  21 mg Transdermal Daily   saccharomyces boulardii  250 mg Oral BID   sodium chloride flush  10-40 mL Intracatheter Q12H    Objective: Vital signs in last 24 hours: Temp:  [99 F (37.2 C)-101.5 F (38.6 C)] 100 F (37.8 C) (04/19 1605) Pulse Rate:  [105-128] 126 (04/19 1605) Resp:  [14-23] 20 (04/19 1605) BP: (96-114)/(62-75) 111/75 (04/19 1605) SpO2:  [97 %-99 %] 97 % (04/19 1605)    Lab Results Recent Labs    06/21/19 0143 06/22/19 0147  WBC 17.6* 12.3*  HGB 9.9* 9.8*  HCT 30.2* 31.0*  NA 137 139  K 3.9 4.0  CL 103 103  CO2 27 28  BUN 9 10  CREATININE 0.57 0.46    Microbiology: reviewed Studies/Results: No results found.   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 = recommend to get chest CT to see if has effusion or empyema that may need to be drained.  Continue on nafcillin  Michelle Doyle [facility] Cell: [phone] Pager: [phone]  06/22/2019, 4:44 PM      

## (undated) NOTE — *Deleted (*Deleted)
    [facility]    Date of Admission:  06/15/2019   Total days of antibiotics 14+  ID: Michelle Doyle is a 29 y.o. female with  Active Problems:   Endocarditis   Hypokalemia   Septic arthritis (HCC)   Splenic infarct   Bacteremia due to methicillin susceptible Staphylococcus aureus (MSSA)   IV drug abuse (HCC)   Tobacco abuse   Positive hepatitis C antibody test    Subjective: Afebrile. Has family stressors and wants to get discharged since her young kids will be unattended, concern that social services will be called  Medications:   Chlorhexidine Gluconate Cloth  6 each Topical Daily   enoxaparin (LOVENOX) injection  40 mg Subcutaneous Q24H   methadone  40 mg Oral Daily   nicotine  21 mg Transdermal Daily    Objective: Vital signs in last 24 hours: Temp:  [97.6 F (36.4 C)-98.4 F (36.9 C)] 97.6 F (36.4 C) (04/27 0438) Pulse Rate:  [94-100] 94 (04/27 0438) Resp:  [18] 18 (04/27 0438) BP: (114-120)/(84-92) 114/84 (04/27 0438) SpO2:  [98 %-100 %] 98 % (04/27 0438) Physical Exam  Constitutional:  oriented to person, place, and time. appears well-developed and well-nourished. No distress.  HENT: [place]/AT, PERRLA, no scleral icterus Mouth/Throat: Oropharynx is clear and moist. No oropharyngeal exudate.  Cardiovascular: Normal rate, regular rhythm and normal heart sounds. Exam reveals no gallop and no friction rub.  No murmur heard.  Pulmonary/Chest: Effort normal and breath sounds normal. No respiratory distress.  has no wheezes.  Neck = supple, no nuchal rigidity Abdominal: Soft. Bowel sounds are normal.  exhibits no distension. There is no tenderness.  Ext: right arm PICC is c/d/i. And left knee wrapped Skin: Skin is warm and dry. No rash noted. No erythema.  Psychiatric: a normal mood and affect.  behavior is normal.    Lab Results Recent Labs    06/29/19 0434  WBC 8.0  HGB 8.5*  HCT 27.1*  NA 140  K 3.6  CL 106  CO2 27  BUN 10    CREATININE 0.51   Liver Panel Recent Labs    06/29/19 0434  PROT 7.3  ALBUMIN 1.8*  AST 8*  ALT 7  ALKPHOS 68  BILITOT 0.8    Microbiology: 4/13 blood cx ngtd 4/11 blood cx mssa Studies/Results: No results found.   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   Dariush Mcnellis [facility] Cell: [phone] Pager: [phone]  06/30/2019, 12:41 PM      

## (undated) NOTE — *Deleted (*Deleted)
PROGRESS NOTE    Michelle Doyle  MRN:[id number] DOB: 05/15/1990 DOA: 06/15/2019 PCP: Patient, No Pcp Per    Chief Complaint  Patient presents with   Abnormal Lab    Brief Narrative:  28 year old with PMH significant for IV heroin abuse, Untreated Hepatitis C, initially admitted for acute heroin withdrawal and sepsis. Patient was transfer to [place] 4/12 for TEE, but it was cancel due to severe anemia, Hb at 6. Patient Decided left AMA from [facility] Her Grandfather brought her back to [place].    Patient was found to have disseminated MSSA Bacteremia, mitral valve and tricuspid valve endocarditis, pulmonary septic emboli, splenic emboli, probable septic arthritis of the left knee.  ID following.    Assessment & Plan:   Active Problems:   Endocarditis   Hypokalemia   Septic arthritis (HCC)   Splenic infarct   Bacteremia due to methicillin susceptible Staphylococcus aureus (MSSA)   IV drug abuse (HCC)   Tobacco abuse   Positive hepatitis C antibody test  1 disseminated MSSA bacteremia, POA Patient noted to have disseminated MSSA bacteremia with pulmonary septic emboli, splenic emboli, probable septic arthritis of the left knee, tricuspid and mitral valve endocarditis.  MRI of the brain was obtained incomplete study patient refused to complete the study however no significant abnormality detected.  2D echo done with thickening tricuspid valve 1.1 x 1.7 cm vegetation, tricuspid valve mild to moderate regurgitation, thickened mitral valve cannot rule out vegetation, abnormal mitral valve.  Blood cultures from 06/11/2019 were growing staph aureus.  Repeat blood cultures 06/13/2019 with cocci in clusters.  Repeat cultures obtained 06/16/2019 with no growth to date.  Patient complaining of sweating overnight with subjective fevers.  RN noted patient with a temperature of 101.6 this afternoon.  ID recommending CT chest which was done on 1419 2021 which showed bilateral diffuse  cavitary lesion some peripherally located. Continue IV nafcillin will likely need total of 6 weeks of IV antibiotics.  ID following and appreciate input and recommendations.  2.  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  Patient with MSSA bacteremia.  Continue IV nafcillin.  Per ID.  3.  Septic emboli/splenic infarct Continue IV nafcillin.  Patient received Lasix 40 mg IV x1(06/21/2019).  We will give another dose of Lasix 20 mg IV x1.  Follow.  Follow.    4.  Left knee septic arthritis Status post I&D of the left knee.  Continue IV nafcillin.  Pain management.  Spoke with orthopedics who was planning on seeing patient this week.  Supportive care.  5.  Opiate abuse Patient uses IV heroin.  Patient states was at the methadone clinic and was getting methadone 35 mg daily and requesting her current dose be increased.  Patient currently on methadone 10 mg twice a day.  Pharmacy contacted methadone clinic and per pharmacist patient noted not to be in the system although patient adamant she goes there.  Methadone has been increased to 35 mg daily,  as patient with complaints of hot flashes, chills.  Continue the clonidine detox protocol. Continue current regimen of Percocet for pain as needed.  Will not escalate pain medication.  6.  Untreated/positive hepatitis C HCV RNA pending.  ID following.  7.  hypokalemia Repleted.  Potassium at 3.8.  Magnesium at 2.0.   8.  Tobacco abuse Nicotine patch.      9.  Thrombocytopenia Resolved.  10.  Iron deficiency anemia Status post 2 units packed red blood cells on 06/15/2019.  Hemoglobin currently stable at 9.5.  Will likely need oral iron supplementation on discharge.     DVT prophylaxis: Lovenox Code Status: Full Family Communication: Updated patient.  No family at bedside.  Disposition:   Status  is: Inpatient    Dispo: The patient is from: Home              Anticipated d/c is to: Likely home              Anticipated d/c date is: In approximately 4-5 weeks.              Patient currently on IV nafcillin and will need to complete 6 weeks of therapy prior to discharge as patient with IV drug abuse.  Pending clearance by ID.        Consultants:  Infectious disease: Dr. Snider  Procedures:  MRI brain from 06/16/2019 PICC line CT chest 06/22/2019   Antimicrobials:  IV nafcillin 06/15/2019 (D8/42) >>>>>   Subjective: Patient in bed listening to music on her phone.  States shortness of breath slowly improving.  Complaining of some difficulty ambulating.  Still ongoing fevers.  States withdrawal symptoms slowly improving.  Feels at night sweats secondary to withdrawal symptoms.  T-max of 100 at 602 AM this morning.  Patient also with some complaints of left knee pain and right flank pain feeling like she may have pulled a muscle.  Objective: Vitals:   06/22/19 2203 06/23/19 0203 06/23/19 0238 06/23/19 0602  BP: 110/76 (!) 99/39 107/65 114/71  Pulse: (!) 116 (!) 105  (!) 119  Resp: 18 18  18  Temp: 98.8 F (37.1 C) 97.6 F (36.4 C)  100 F (37.8 C)  TempSrc: Oral Oral  Oral  SpO2: 97% 98%  96%  Weight:      Height:        Intake/Output Summary (Last 24 hours) at 06/23/2019 1115 Last data filed at 06/23/2019 0600 Gross per 24 hour  Intake 496.8 ml  Output 200 ml  Net 296.8 ml   Filed Weights   06/16/19 0110 06/16/19 0816  Weight: 49.9 kg 65.2 kg    Examination:  General exam: NAD Respiratory system: Decreased scattered coarse breath sounds.  No wheezing.  Fair air movement.  Speaking in full sentences.  Cardiovascular system: Tachycardia.  No JVD.  No lower extremity edema.  Gastrointestinal system: Abdomen is soft, nontender, nondistended, positive bowel sounds.  No rebound.  No guarding. Central nervous system: Alert and oriented. No focal neurological  deficits. Extremities: Left knee with dressing noted, no significant surrounding erythema.  Skin: No rashes, lesions or ulcers Psychiatry: Judgement and insight appear normal. Mood & affect appropriate.     Data Reviewed: I have personally reviewed following labs and imaging studies  CBC: Recent Labs  Lab 06/18/19 1032 06/18/19 1032 06/19/19 1051 06/20/19 1107 06/21/19 0143 06/22/19 0147 06/23/19 0831  WBC 17.3*   < > 19.0* 17.5* 17.6* 12.3* 12.5*  NEUTROABS 13.7*  --  15.4* 13.7*  --  8.8* 9.6*  HGB 10.6*   < > 9.6* 9.5* 9.9* 9.8* 9.5*  HCT 31.3*   < > 29.6* 29.6* 30.2* 31.0* 30.0*  MCV 82.6   < > 85.8 86.3 86.0 87.1 87.0  PLT 308   < > 331 401* 466* 495* 445*   < > = values in this interval not displayed.      Basic Metabolic Panel: Recent Labs  Lab 06/18/19 1032 06/18/19 1032 06/19/19 1051 06/20/19 1107 06/21/19 0143 06/22/19 0147 06/23/19 0831  NA 140   < > 142 139 137 139 139  K 3.4*   < > 3.5 3.4* 3.9 4.0 3.8  CL 109   < > 108 107 103 103 105  CO2 24   < > 26 28 27 28 26  GLUCOSE 111*   < > 100* 103* 121* 99 116*  BUN <5*   < > 6 7 9 10 10  CREATININE 0.46   < > 0.46 0.46 0.57 0.46 0.54  CALCIUM 7.4*   < > 7.8* 7.7* 7.9* 8.1* 8.1*  MG 2.0  --  2.0 1.8 2.0 2.0  --    < > = values in this interval not displayed.    GFR: Estimated Creatinine Clearance: 103.6 mL/min (by C-G formula based on SCr of 0.54 mg/dL).  Liver Function Tests: Recent Labs  Lab 06/17/19 1211  AST 18  ALT 12  ALKPHOS 61  BILITOT 0.8  PROT 6.4*  ALBUMIN 1.6*    CBG: No results for input(s): GLUCAP in the last 168 hours.   Recent Results (from the past 240 hour(s))  Culture, blood (routine x 2)     Status: Abnormal   Collection Time: 06/13/19 11:53 AM   Specimen: BLOOD LEFT HAND  Result Value Ref Range Status   Specimen Description   Final    BLOOD LEFT HAND Performed at [facility], [address] [place], [place] [address]    Special Requests    Final    BOTTLES DRAWN AEROBIC AND ANAEROBIC Blood Culture adequate volume Performed at [facility], [address] [place], [place] [address]    Culture  Setup Time   Final    GRAM POSITIVE COCCI IN CLUSTERS AEROBIC BOTTLE ONLY CRITICAL RESULT CALLED TO, READ BACK BY AND VERIFIED WITH: PHARMD C SHADE [id number] AT 954 AM BY CM    Culture (A)  Final    STAPHYLOCOCCUS AUREUS SUSCEPTIBILITIES PERFORMED ON PREVIOUS CULTURE WITHIN THE LAST 5 DAYS. Performed at [facility], [address] [place], [place] [address]    Report Status 06/15/2019 FINAL  Final  Culture, blood (routine x 2)     Status: Abnormal   Collection Time: 06/13/19 11:53 AM   Specimen: BLOOD RIGHT HAND  Result Value Ref Range Status   Specimen Description   Final    BLOOD RIGHT HAND Performed at [facility], [address] [place], [place] [address]    Special Requests   Final    BOTTLES DRAWN AEROBIC AND ANAEROBIC Blood Culture results may not be optimal due to an inadequate volume of blood received in culture bottles Performed at [facility], [address] [place], [place] [address]    Culture  Setup Time   Final    GRAM POSITIVE COCCI IN CLUSTERS AEROBIC BOTTLE ONLY CRITICAL RESULT CALLED TO, READ BACK BY AND VERIFIED WITH: PHARMD C SHADE [id number] AT 954 AM BY CM    Culture (A)  Final    STAPHYLOCOCCUS AUREUS SUSCEPTIBILITIES PERFORMED ON PREVIOUS CULTURE WITHIN THE LAST 5 DAYS. Performed at [facility], [address] ,  [address]    Report Status 06/15/2019 FINAL  Final  Culture, blood (Routine X 2) w Reflex to ID Panel     Status: None   Collection Time: 06/16/19  4:33 PM     Specimen: BLOOD LEFT HAND  Result Value Ref Range Status   Specimen Description   Final    BLOOD LEFT HAND Performed at [facility], [address] [place], [place] [address]    Special Requests   Final    BOTTLES DRAWN  AEROBIC ONLY Blood Culture adequate volume Performed at [facility], [address] [place], [place] [address]    Culture   Final    NO GROWTH 5 DAYS Performed at [facility], [address] [place], [place] [address]    Report Status 06/21/2019 FINAL  Final  Culture, blood (Routine X 2) w Reflex to ID Panel     Status: None   Collection Time: 06/16/19  4:40 PM   Specimen: BLOOD LEFT HAND  Result Value Ref Range Status   Specimen Description   Final    BLOOD LEFT HAND Performed at [facility], [address] [place], [place] [address]    Special Requests   Final    BOTTLES DRAWN AEROBIC ONLY Blood Culture results may not be optimal due to an inadequate volume of blood received in culture bottles Performed at [facility], [address] [place], [place] [address]    Culture   Final    NO GROWTH 5 DAYS Performed at [facility], [address] [place], [place] [address]    Report Status 06/21/2019 FINAL  Final  Culture, Urine     Status: Abnormal   Collection Time: 06/21/19  1:05 PM   Specimen: Urine, Random  Result Value Ref Range Status   Specimen Description   Final    URINE, RANDOM Performed at [facility], [address] [place], [place] [address]    Special Requests   Final    NONE Performed at [facility], [address] , [place] [address]    Culture (A)  Final    <10,000 COLONIES/mL INSIGNIFICANT GROWTH Performed at [facility], [address] ,  [address]    Report Status 06/22/2019 FINAL  Final         Radiology Studies: CT CHEST WO CONTRAST  Result Date: 06/23/2019 CLINICAL DATA:  Follow-up endocarditis, bacteremia. Evaluate for empyema. EXAM: CT CHEST WITHOUT CONTRAST TECHNIQUE: Multidetector CT imaging of the chest was performed following the standard protocol without IV contrast. COMPARISON:  06/11/2019  FINDINGS: Cardiovascular: Heart is mildly enlarged. Small pericardial effusion. Aorta is normal caliber. Mediastinum/Nodes: Enlarged mediastinal lymph nodes, similar to prior study. AP window node has a short axis diameter of 10 mm. Precarinal lymph node has a short axis diameter of 11 mm. No axillary adenopathy. Lungs/Pleura: Numerous cavitary nodules and masses within the lungs as well as non cavitary nodules and masses and ground-glass opacities. Overall cavitation of many of the nodules have increased since prior study. Small bilateral pleural effusions, new since prior study. Upper Abdomen: Imaging into the upper abdomen shows no acute findings. Musculoskeletal: Chest wall soft tissues are unremarkable. No acute bony abnormality. IMPRESSION: Extensive cavitary and non cavitary nodules and masses throughout the lungs. Findings are similar to prior study. Degree of cavitation has increased since prior study. Small bilateral effusions. Mediastinal adenopathy. Borderline heart size.  Small pericardial effusion. Electronically Signed   By: Kevin  Dover M.D.   On: 06/23/2019 00:08        Scheduled Meds:  Chlorhexidine Gluconate Cloth  6 each Topical Daily   enoxaparin (LOVENOX) injection    40 mg Subcutaneous Q24H   methadone  35 mg Oral Daily   nicotine  21 mg Transdermal Daily   saccharomyces boulardii  250 mg Oral BID   sodium chloride flush  10-40 mL Intracatheter Q12H   Continuous Infusions:  nafcillin (NAFCIL) continuous infusion 20.8 mL/hr at 06/22/19 1800     LOS: 8 days    Time spent: 35 minutes    Daniel Thompson, MD [facility]   To contact the attending provider between 7A-7P or the covering provider during after hours 7P-7A, please log into the web site [email or web address] and access using universal [place] password for that web site. If you do not have the password, please call the hospital operator.  06/23/2019, 11:15 AM   

## (undated) NOTE — *Deleted (*Deleted)
PROGRESS NOTE    Michelle Doyle  MRN:[id number] DOB: 09/12/1990 DOA: 06/15/2019 PCP: Patient, No Pcp Per    Chief Complaint  Patient presents with   Abnormal Lab    Brief Narrative: (Start on day 1 of progress note - keep it brief and live) 28 year old with PMH significant for IV heroin abuse, Untreated Hepatitis C, initially admitted for acute heroin withdrawal and sepsis. Patient was transfer to [facility] 4/12 for TEE, but it was cancel due to severe anemia, Hb at 6. Patient Decided left AMA from [facility] Her Grandfather brought her back to [place].    Patient was found to have disseminated MSSA Bacteremia, mitral valve and tricuspid valve endocarditis, pulmonary septic emboli, splenic emboli, probable septic arthritis of the left knee.  ID following.    Assessment & Plan:   Active Problems:   Endocarditis   Hypokalemia   Septic arthritis (HCC)   Splenic infarct   Bacteremia due to methicillin susceptible Staphylococcus aureus (MSSA)   IV drug abuse (HCC)   Tobacco abuse   Positive hepatitis C antibody test  1 disseminated MSSA bacteremia, POA Patient noted to have disseminated MSSA bacteremia with pulmonary septic emboli, splenic emboli, probable septic arthritis of the left knee, tricuspid and mitral valve endocarditis.  MRI of the brain was obtained incomplete study patient refused to complete the study however no significant abnormality detected.  2D echo done with thickening tricuspid valve 1.1 x 1.7 cm vegetation, tricuspid valve mild to moderate regurgitation, thickened mitral valve cannot rule out vegetation, abnormal mitral valve.  Blood cultures from 06/11/2019 were growing staph aureus.  Repeat blood cultures 06/13/2019 with cocci in clusters.  Repeat cultures obtained 06/16/2019 with no growth to date.  Patient complaining of sweating overnight with subjective fevers.  RN noted patient with a temperature of 101.6 this afternoon.  ID recommending CT chest to  rule out effusion or empyema which may need to be drained. Continue IV nafcillin will likely need total of 6 weeks of IV antibiotics.  ID following and appreciate input and recommendations.  2.  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  Patient with MSSA bacteremia.  Continue IV nafcillin.  Per ID.  3.  Septic emboli/splenic infarct Continue IV nafcillin.  Patient received Lasix 40 mg IV x1(06/21/2019). Follow.    4.  Left knee septic arthritis Status post I&D of the left knee.  Continue IV nafcillin.  Pain management.  Spoke with orthopedics who was planning on seeing patient this week.  Supportive care.  5.  Opiate abuse Patient uses IV heroin.  Patient states was at the methadone clinic and was getting methadone 35 mg daily and requesting her current dose be increased.  Patient currently on methadone 10 mg twice a day.  Pharmacy contacted methadone clinic and per pharmacist patient noted not to be in the system although patient adamant she goes there.  Methadone has been increased to 35 mg daily,  as patient with complaints of hot flashes, chills.  Continue the clonidine detox protocol. Continue current regimen of Percocet for pain as needed.  Will not escalate pain medication.  6.  Untreated/positive hepatitis C HCV RNA pending.  ID following.  7.  hypokalemia Repleted.  Potassium at 4.  Magnesium at 2.0.   8.  Tobacco abuse Nicotine patch.    9.  Thrombocytopenia Resolved.    10.  Iron deficiency anemia Status post 2 units packed red blood cells on 06/15/2019.  Hemoglobin currently stable at 9.8.  Will likely need oral iron supplementation on discharge.     DVT prophylaxis: Lovenox Code Status: Full Family Communication: Updated patient.  Updated mother at bedside. Disposition:   Status is: Inpatient    Dispo: The patient is  from: Home              Anticipated d/c is to: Likely home              Anticipated d/c date is: In approximately 5 weeks.              Patient currently on IV nafcillin and will need to complete 6 weeks of therapy prior to discharge as patient with IV drug abuse.  Pending clearance by ID.        Consultants:  Infectious disease: Dr. Snider  Procedures:  MRI brain from 06/16/2019 PICC line  Antimicrobials:  IV nafcillin 06/15/2019 (D8/42) >>>>>   Subjective: Patient in bed.  Mother at bedside.  Patient does state felt she had fevers overnight as bed sheets were drenched and soaked and was unable to get any help until he was changed this morning by oncoming shift.  Few shortness of breath improving.  Denies any chest pain.  Denies any dysuria.  Thinks lower back pain likely more musculoskeletal.  Patient noted per RN to have a temperature of 101.5 this afternoon.    Objective: Vitals:   06/21/19 1817 06/21/19 2006 06/22/19 0651 06/22/19 0836  BP: 114/70 102/65 96/62 112/69  Pulse: (!) 124 (!) 118 (!) 105 (!) 111  Resp: 20 20 14 18  Temp: 99 F (37.2 C) 99.6 F (37.6 C) 99.6 F (37.6 C) 99.4 F (37.4 C)  TempSrc: Oral Oral Oral Oral  SpO2: 97% 97% 97% 97%  Weight:      Height:        Intake/Output Summary (Last 24 hours) at 06/22/2019 1156 Last data filed at 06/22/2019 0600 Gross per 24 hour  Intake 689.51 ml  Output 800 ml  Net -110.49 ml   Filed Weights   06/16/19 0110 06/16/19 0816  Weight: 49.9 kg 65.2 kg    Examination:  General exam: NAD Respiratory system: Scattered coarse breath sounds improving.  No wheezing.  Fair air movement.  Speaking in full sentences.   Cardiovascular system: Regular rate rhythm no murmurs rubs or gallops.  No JVD.  No lower extremity edema.  Gastrointestinal system: Abdomen is nontender, nondistended, soft, positive bowel sounds.  No rebound.  No guarding.  Central nervous system: Alert and oriented. No focal neurological  deficits. Extremities: Left knee with dressing noted, no significant surrounding erythema.  Skin: No rashes, lesions or ulcers Psychiatry: Judgement and insight appear normal. Mood & affect appropriate.     Data Reviewed: I have personally reviewed following labs and imaging studies  CBC: Recent Labs  Lab 06/17/19 1211 06/17/19 1211 06/18/19 1032 06/19/19 1051 06/20/19 1107 06/21/19 0143 06/22/19 0147  WBC 20.0*   < > 17.3* 19.0* 17.5* 17.6* 12.3*  NEUTROABS 15.5*  --  13.7* 15.4* 13.7*  --  8.8*  HGB 10.5*   < > 10.6* 9.6* 9.5* 9.9* 9.8*  HCT 30.8*   < > 31.3* 29.6* 29.6* 30.2* 31.0*  MCV 80.6   < > 82.6 85.8 86.3 86.0 87.1  PLT 293   < > 308 331 401* 466* 495*   < > = values in   this interval not displayed.    Basic Metabolic Panel: Recent Labs  Lab 06/18/19 1032 06/19/19 1051 06/20/19 1107 06/21/19 0143 06/22/19 0147  NA 140 142 139 137 139  K 3.4* 3.5 3.4* 3.9 4.0  CL 109 108 107 103 103  CO2 24 26 28 27 28  GLUCOSE 111* 100* 103* 121* 99  BUN <5* 6 7 9 10  CREATININE 0.46 0.46 0.46 0.57 0.46  CALCIUM 7.4* 7.8* 7.7* 7.9* 8.1*  MG 2.0 2.0 1.8 2.0 2.0    GFR: Estimated Creatinine Clearance: 103.6 mL/min (by C-G formula based on SCr of 0.46 mg/dL).  Liver Function Tests: Recent Labs  Lab 06/16/19 0500 06/17/19 1211  AST 20 18  ALT 11 12  ALKPHOS 72 61  BILITOT 0.9 0.8  PROT 6.7 6.4*  ALBUMIN 1.8* 1.6*    CBG: No results for input(s): GLUCAP in the last 168 hours.   Recent Results (from the past 240 hour(s))  Culture, blood (routine x 2)     Status: Abnormal   Collection Time: 06/13/19 11:53 AM   Specimen: BLOOD LEFT HAND  Result Value Ref Range Status   Specimen Description   Final    BLOOD LEFT HAND Performed at [facility], [address] [place], [place] [address]    Special Requests   Final    BOTTLES DRAWN AEROBIC AND ANAEROBIC Blood Culture adequate volume Performed at [facility], [address] [place], [place] [address]    Culture  Setup Time   Final    GRAM POSITIVE COCCI IN CLUSTERS AEROBIC BOTTLE ONLY CRITICAL RESULT CALLED TO, READ BACK BY AND VERIFIED WITH: PHARMD C SHADE [id number] AT 954 AM BY CM    Culture (A)  Final    STAPHYLOCOCCUS AUREUS SUSCEPTIBILITIES PERFORMED ON PREVIOUS CULTURE WITHIN THE LAST 5 DAYS. Performed at [facility], [address] [place], [place] [address]    Report Status 06/15/2019 FINAL  Final  Culture, blood (routine x 2)     Status: Abnormal   Collection Time: 06/13/19 11:53 AM   Specimen: BLOOD RIGHT HAND  Result Value Ref Range Status   Specimen Description   Final    BLOOD RIGHT HAND Performed at [facility], [address] [place], [place] [address]    Special Requests   Final    BOTTLES DRAWN AEROBIC AND ANAEROBIC Blood Culture results may not be optimal due to an inadequate volume of blood received in culture bottles Performed at [facility], [address] [place], [place] [address]    Culture  Setup Time   Final    GRAM POSITIVE COCCI IN CLUSTERS AEROBIC BOTTLE ONLY CRITICAL RESULT CALLED TO, READ BACK BY AND VERIFIED WITH: PHARMD C SHADE [id number] AT 954 AM BY CM    Culture (A)  Final    STAPHYLOCOCCUS AUREUS SUSCEPTIBILITIES PERFORMED ON PREVIOUS CULTURE WITHIN THE LAST 5 DAYS. Performed at [facility], [address] [place], [place] [address]    Report Status 06/15/2019 FINAL  Final  Culture, blood (Routine X 2) w Reflex to ID Panel     Status: None   Collection Time: 06/16/19  4:33 PM   Specimen: BLOOD LEFT HAND  Result Value Ref Range Status   Specimen Description   Final    BLOOD LEFT HAND Performed at [facility], [address] [place], [place] [address]    Special Requests   Final      BOTTLES DRAWN AEROBIC ONLY Blood Culture adequate volume Performed at [facility], [address] [place], [place] [address]     Culture   Final    NO GROWTH 5 DAYS Performed at [facility], [address] [place], [place] [address]    Report Status 06/21/2019 FINAL  Final  Culture, blood (Routine X 2) w Reflex to ID Panel     Status: None   Collection Time: 06/16/19  4:40 PM   Specimen: BLOOD LEFT HAND  Result Value Ref Range Status   Specimen Description   Final    BLOOD LEFT HAND Performed at [facility], [address] [place], [place] [address]    Special Requests   Final    BOTTLES DRAWN AEROBIC ONLY Blood Culture results may not be optimal due to an inadequate volume of blood received in culture bottles Performed at [facility], [address] [place], [place] [address]    Culture   Final    NO GROWTH 5 DAYS Performed at [facility], [address] [place], [place] [address]    Report Status 06/21/2019 FINAL  Final         Radiology Studies: No results found.      Scheduled Meds:  Chlorhexidine Gluconate Cloth  6 each Topical Daily   enoxaparin (LOVENOX) injection  40 mg Subcutaneous Q24H   methadone  35 mg Oral Daily   nicotine  21 mg Transdermal Daily   saccharomyces boulardii  250 mg Oral BID   sodium chloride flush  10-40 mL Intracatheter Q12H   Continuous Infusions:  nafcillin (NAFCIL) continuous infusion 12 g (06/21/19 1659)     LOS: 7 days    Time spent: 35 minutes    Matalie Romberger, MD [facility]   To contact the attending provider between 7A-7P or the covering provider during after hours 7P-7A, please log into the web site [email or web address] and access using universal [place] password for that web site. If you do not have the password, please call the hospital operator.  06/22/2019, 11:56 AM   

## (undated) NOTE — *Deleted (*Deleted)
PROGRESS NOTE    Michelle Doyle  MRN:[id number] DOB: 06/28/1990 DOA: 06/15/2019 PCP: Patient, No Pcp Per    Chief Complaint  Patient presents with   Abnormal Lab    Brief Narrative: (Start on day 1 of progress note - keep it brief and live) 28 year old with PMH significant for IV heroin abuse, Untreated Hepatitis C, initially admitted for acute heroin withdrawal and sepsis. Patient was transfer to [facility] 4/12 for TEE, but it was cancel due to severe anemia, Hb at 6. Patient Decided left AMA from [facility] Her Grandfather brought her back to [place].    Patient was found to have disseminated MSSA Bacteremia, mitral valve and tricuspid valve endocarditis, pulmonary septic emboli, splenic emboli, probable septic arthritis of the left knee.  ID following.    Assessment & Plan:   Active Problems:   Endocarditis   Hypokalemia   Septic arthritis (HCC)   Splenic infarct   Bacteremia due to methicillin susceptible Staphylococcus aureus (MSSA)   IV drug abuse (HCC)   Tobacco abuse   Positive hepatitis C antibody test  #1 disseminated MSSA bacteremia Patient noted to have disseminated MSSA bacteremia with pulmonary septic emboli, splenic emboli, probable septic arthritis of the left knee, tricuspid and mitral valve endocarditis.  MRI of the brain was obtained incomplete study patient refused to complete the study however no significant abnormality detected.  2D echo done with thickening tricuspid valve 1.1 x 1.7 cm vegetation, tricuspid valve mild to moderate regurgitation, thickened mitral valve cannot rule out vegetation, abnormal mitral valve.  Blood cultures from 06/11/2019 were growing staph aureus.  Repeat blood cultures 06/13/2019 with cocci in clusters.  Repeat cultures obtained 13 2021 pending with no growth to date.  Continue IV nafcillin will likely need 6 weeks of IV antibiotics.  ID following and appreciate input and recommendations.  2.  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  Patient with MSSA bacteremia.  Continue IV nafcillin.  Per ID.  3.  Septic emboli/splenic infarct IV nafcillin.  4.  Left knee septic arthritis Status post I&D of the left knee.  Continue IV nafcillin.  Pain management.  Supportive care.  5.  Opiate abuse Patient uses IV heroin.  Patient states was at the methadone clinic and was getting methadone 35 mg daily and requesting her current dose be increased.  Patient currently on methadone 10 mg twice a day.  Pharmacy contacted methadone clinic and per pharmacist patient noted not to be in the system although patient adamant she goes there.  We will continue current dose of methadone for now.  Continue the clonidine detox protocol.  Continue current regimen of Percocet for pain as needed.    6.  Untreated/positive hepatitis C HCV RNA pending.  ID following.  7.  hypokalemia Potassium at 3.4 this morning.  K. Dur 40 mEq p.o. x1.  Magnesium currently at 2.0.  Follow.    8.  Tobacco abuse Nicotine patch.  Tobacco cessation stressed to patient.  9.  Thrombocytopenia Resolved.  10.  Iron deficiency anemia Status post 2 units packed red blood cells on 06/15/2019.  Hemoglobin currently at 10.6.  Will likely need oral iron supplementation on discharge.  May consider IV iron during this hospitalization however will discuss with ID.    DVT prophylaxis: Lovenox Code Status: Full Family Communication: Updated patient and grandfather   at bedside. Disposition:   Status is: Inpatient    Dispo: The patient is from: Home              Anticipated d/c is to: Likely home              Anticipated d/c date is: In approximately 5 weeks.              Patient currently on IV nafcillin and will need to complete 6 weeks of therapy prior to discharge as patient with IV drug  abuse.        Consultants:  Infectious disease: Dr. Snyder  Procedures:  MRI brain from 06/16/2019   Antimicrobials:  IV nafcillin 06/15/2019 (D8/42) >>>>>   Subjective: Patient sitting up in bed.  States she is feeling better.  Denies any chest pain.  No shortness of breath.  Denies any abdominal pain.  Patient does state she has been on's Seroquel 150 mg nightly before in the past and requesting it to be reordered.  Patient adamant that she does go to the methadone clinic and is on 35 mg of methadone daily although per pharmacy patient noted not to be in their system.  Patient with formed stool and no further diarrhea per RN  Objective: Vitals:   06/17/19 1338 06/17/19 2136 06/18/19 0521 06/18/19 1254  BP: 130/88 130/90 120/83 122/79  Pulse: 92 94 96 (!) 106  Resp: 19  18 18  Temp: 98.8 F (37.1 C) (!) 100.8 F (38.2 C) 98.3 F (36.8 C) 98.4 F (36.9 C)  TempSrc: Oral Oral Oral Oral  SpO2: 100% 97% 99% 100%  Weight:      Height:        Intake/Output Summary (Last 24 hours) at 06/18/2019 1910 Last data filed at 06/18/2019 1700 Gross per 24 hour  Intake 4044.08 ml  Output --  Net 4044.08 ml   Filed Weights   06/16/19 0110 06/16/19 0816  Weight: 49.9 kg 65.2 kg    Examination:  General exam: NAD Respiratory system: Some scattered coarse breath sounds.  No wheezing.  No crackles.  Speaking in full sentences.  Normal respiratory effort.  Cardiovascular system: Regular rate and rhythm no murmurs rubs or gallops.  No JVD.  No lower extremity edema.  Gastrointestinal system: Abdomen is obese, soft, nontender, nondistended, positive bowel sounds.  No rebound.  No guarding.  Central nervous system: Alert and oriented. No focal neurological deficits. Extremities: Left knee bandaged.  Skin: No rashes, lesions or ulcers Psychiatry: Judgement and insight appear normal. Mood & affect appropriate.     Data Reviewed: I have personally reviewed following labs and imaging  studies  CBC: Recent Labs  Lab 06/14/19 0838 06/14/19 0838 06/15/19 0756 06/15/19 1421 06/16/19 0500 06/17/19 1211 06/18/19 1032  WBC 17.9*   < > 18.8* 19.5* 19.5* 20.0* 17.3*  NEUTROABS 14.2*  --  14.4* 16.3*  --  15.5* 13.7*  HGB 7.7*   < > 6.5* 6.8* 11.7* 10.5* 10.6*  HCT 24.8*   < > 18.6* 20.9* 35.7* 30.8* 31.3*  MCV 85.5   < > 75.9* 80.4 84.8 80.6 82.6  PLT 188   < > 216 232 268 293 308   < > = values in this interval not displayed.    Basic Metabolic Panel: Recent Labs  Lab 06/13/19 1153 06/14/19 0838 06/15/19 0756 06/15/19 1421 06/15/19 1520 06/16/19 0500 06/17/19 1211 06/18/19 1032  NA 141   < > 138 135  --  140 139 140    K 2.6*   < > 3.7 3.0*  --  3.9 3.0* 3.4*  CL 108   < > 106 102  --  108 108 109  CO2 24   < > 23 23  --  22 23 24  GLUCOSE 88   < > 87 153*  --  102* 91 111*  BUN 17   < > 6 7  --  <5* <5* <5*  CREATININE 0.66   < > 0.48 0.53  --  0.51 0.45 0.46  CALCIUM 7.6*   < > 7.0* 7.1*  --  7.5* 7.3* 7.4*  MG 2.5*  --   --   --  2.0  --  1.7 2.0   < > = values in this interval not displayed.    GFR: Estimated Creatinine Clearance: 103.6 mL/min (by C-G formula based on SCr of 0.46 mg/dL).  Liver Function Tests: Recent Labs  Lab 06/16/19 0500 06/17/19 1211  AST 20 18  ALT 11 12  ALKPHOS 72 61  BILITOT 0.9 0.8  PROT 6.7 6.4*  ALBUMIN 1.8* 1.6*    CBG: No results for input(s): GLUCAP in the last 168 hours.   Recent Results (from the past 240 hour(s))  Culture, blood (Routine x 2)     Status: Abnormal   Collection Time: 06/11/19 12:05 AM   Specimen: BLOOD  Result Value Ref Range Status   Specimen Description   Final    BLOOD BLOOD LEFT HAND Performed at [facility], [address] [name], [place] [address]    Special Requests   Final    BOTTLES DRAWN AEROBIC ONLY Blood Culture adequate volume Performed at [facility], [address] [place], [place] [address]    Culture  Setup Time   Final     GRAM POSITIVE COCCI IN CLUSTERS AEROBIC BOTTLE ONLY CRITICAL VALUE NOTED.  VALUE IS CONSISTENT WITH PREVIOUSLY REPORTED AND CALLED VALUE.    Culture (A)  Final    STAPHYLOCOCCUS AUREUS SUSCEPTIBILITIES PERFORMED ON PREVIOUS CULTURE WITHIN THE LAST 5 DAYS. Performed at [facility], [address] [place], [place] [address]    Report Status 06/13/2019 FINAL  Final  Urine culture     Status: Abnormal   Collection Time: 06/11/19 12:05 AM   Specimen: In/Out Cath Urine  Result Value Ref Range Status   Specimen Description   Final    IN/OUT CATH URINE Performed at [facility], [address] [place], [place] [address]    Special Requests   Final    NONE [facility]formed at  [facility], [address] [place], [place] [address]    Culture (A)  Final    >=100,000 COLONIES/mL KOCURIA SPECIES 10,000 COLONIES/mL STAPHYLOCOCCUS AUREUS Standardized susceptibility testing for this organism is not available. FOR KOCURIA SPECIES Performed at  [facility], [address] [place],  [address]    Report Status 06/13/2019 FINAL  Final   Organism ID, Bacteria STAPHYLOCOCCUS AUREUS (A)  Final      Susceptibility   Staphylococcus aureus - MIC*    CIPROFLOXACIN >=8 RESISTANT Resistant     GENTAMICIN <=0.5 SENSITIVE Sensitive     NITROFURANTOIN <=16 SENSITIVE Sensitive     OXACILLIN 0.5 SENSITIVE Sensitive     TETRACYCLINE <=1 SENSITIVE Sensitive     VANCOMYCIN 1 SENSITIVE Sensitive     TRIMETH/SULFA <=10 SENSITIVE Sensitive     CLINDAMYCIN <=0.25 SENSITIVE Sensitive     RIFAMPIN <=0.5 SENSITIVE   Sensitive     Inducible Clindamycin NEGATIVE Sensitive     * 10,000 COLONIES/mL STAPHYLOCOCCUS AUREUS  Culture, blood (Routine x 2)     Status: Abnormal   Collection Time: 06/11/19 12:10 AM   Specimen: BLOOD  Result Value Ref Range Status   Specimen Description   Final    BLOOD BLOOD RIGHT FOREARM Performed at [facility],  [address] [place], [place] [address]    Special Requests   Final    BOTTLES DRAWN AEROBIC ONLY Blood Culture adequate volume Performed at [facility], [address] [place], [place] [address]    Culture  Setup Time   Final    GRAM POSITIVE COCCI IN CLUSTERS AEROBIC BOTTLE ONLY CRITICAL RESULT CALLED TO, READ BACK BY AND VERIFIED WITH: PHARMD MICHELLE B. [id number] FCP Performed at [facility], [address] ,  [address]    Culture STAPHYLOCOCCUS AUREUS (A)  Final   Report Status 06/13/2019 FINAL  Final   Organism ID, Bacteria STAPHYLOCOCCUS AUREUS  Final      Susceptibility   Staphylococcus aureus - MIC*    CIPROFLOXACIN >=8 RESISTANT Resistant     ERYTHROMYCIN >=8 RESISTANT Resistant     GENTAMICIN <=0.5 SENSITIVE Sensitive     OXACILLIN 0.5 SENSITIVE Sensitive     TETRACYCLINE <=1 SENSITIVE Sensitive     VANCOMYCIN 1 SENSITIVE Sensitive     TRIMETH/SULFA <=10 SENSITIVE Sensitive     CLINDAMYCIN <=0.25 SENSITIVE Sensitive     RIFAMPIN <=0.5 SENSITIVE Sensitive     Inducible Clindamycin NEGATIVE Sensitive     * STAPHYLOCOCCUS AUREUS  Blood Culture ID Panel (Reflexed)     Status: Abnormal   Collection Time: 06/11/19 12:10 AM  Result Value Ref Range Status   Enterococcus species NOT DETECTED NOT DETECTED Final   Listeria monocytogenes NOT DETECTED NOT DETECTED Final   Staphylococcus species DETECTED (A) NOT DETECTED Final    Comment: CRITICAL RESULT CALLED TO, READ BACK BY AND VERIFIED WITH: PHARMD MICHELLE B. [id number] FCP    Staphylococcus aureus (BCID) DETECTED (A) NOT DETECTED Final    Comment: Methicillin (oxacillin) susceptible Staphylococcus aureus (MSSA). Preferred therapy is anti staphylococcal beta lactam antibiotic (Cefazolin or Nafcillin), unless clinically contraindicated. CRITICAL RESULT CALLED TO, READ BACK BY AND VERIFIED WITH: PHARMD MICHELLE B. [phone] [id number] FCP    Methicillin resistance NOT DETECTED NOT  DETECTED Final   Streptococcus species NOT DETECTED NOT DETECTED Final   Streptococcus agalactiae NOT DETECTED NOT DETECTED Final   Streptococcus pneumoniae NOT DETECTED NOT DETECTED Final   Streptococcus pyogenes NOT DETECTED NOT DETECTED Final   Acinetobacter baumannii NOT DETECTED NOT DETECTED Final   Enterobacteriaceae species NOT DETECTED NOT DETECTED Final   Enterobacter cloacae complex NOT DETECTED NOT DETECTED Final   Escherichia coli NOT DETECTED NOT DETECTED Final   Klebsiella oxytoca NOT DETECTED NOT DETECTED Final   Klebsiella pneumoniae NOT DETECTED NOT DETECTED Final   Proteus species NOT DETECTED NOT DETECTED Final   Serratia marcescens NOT DETECTED NOT DETECTED Final   Haemophilus influenzae NOT DETECTED NOT DETECTED Final   Neisseria meningitidis NOT DETECTED NOT DETECTED Final   Pseudomonas aeruginosa NOT DETECTED NOT DETECTED Final   Candida albicans NOT DETECTED NOT DETECTED Final   Candida glabrata NOT DETECTED NOT DETECTED Final   Candida krusei NOT DETECTED NOT DETECTED Final   Candida parapsilosis NOT DETECTED NOT DETECTED Final   Candida tropicalis NOT DETECTED NOT DETECTED Final    Comment:   Performed at [facility], [address] [place], [place] [address]  SARS CORONAVIRUS 2 (TAT 6-24 HRS) Nasopharyngeal Nasopharyngeal Swab     Status: None   Collection Time: 06/11/19 12:52 AM   Specimen: Nasopharyngeal Swab  Result Value Ref Range Status   SARS Coronavirus 2 NEGATIVE NEGATIVE Final    Comment: (NOTE) SARS-CoV-2 target nucleic acids are NOT DETECTED. The SARS-CoV-2 RNA is generally detectable in upper and lower respiratory specimens during the acute phase of infection. Negative results do not preclude SARS-CoV-2 infection, do not rule out co-infections with other pathogens, and should not be used as the sole basis for treatment or other patient management decisions. Negative results must be combined with clinical observations, patient history,  and epidemiological information. The expected result is Negative. Fact Sheet for Patients: https://www.fda.gov/media/138098/download Fact Sheet for Healthcare Providers: https://www.fda.gov/media/138095/download This test is not yet approved or cleared by the [place] FDA and  has been authorized for detection and/or diagnosis of SARS-CoV-2 by FDA under an Emergency Use Authorization (EUA). This EUA will remain  in effect (meaning this test can be used) for the duration of the COVID-19 declaration under Section 56 4(b)(1) of the Act, 21 U.S.C. section 360bbb-3(b)(1), unless the authorization is terminated or revoked sooner. Performed at [facility], [address] [place], [place] [address]   Respiratory Panel by RT PCR (Flu A&B, Covid) - Nasopharyngeal Swab     Status: None   Collection Time: 06/11/19  5:25 AM   Specimen: Nasopharyngeal Swab  Result Value Ref Range Status   SARS Coronavirus 2 by RT PCR NEGATIVE NEGATIVE Final    Comment: (NOTE) SARS-CoV-2 target nucleic acids are NOT DETECTED. The SARS-CoV-2 RNA is generally detectable in upper respiratoy specimens during the acute phase of infection. The lowest concentration of SARS-CoV-2 viral copies this assay can detect is 131 copies/mL. A negative result does not preclude SARS-Cov-2 infection and should not be used as the sole basis for treatment or other patient management decisions. A negative result may occur with  improper specimen collection/handling, submission of specimen other than nasopharyngeal swab, presence of viral mutation(s) within the areas targeted by this assay, and inadequate number of viral copies (<131 copies/mL). A negative result must be combined with clinical observations, patient history, and epidemiological information. The expected result is Negative. Fact Sheet for Patients:  https://www.fda.gov/media/142436/download Fact Sheet for Healthcare Providers:   https://www.fda.gov/media/142435/download This test is not yet ap proved or cleared by the [place] FDA and  has been authorized for detection and/or diagnosis of SARS-CoV-2 by FDA under an Emergency Use Authorization (EUA). This EUA will remain  in effect (meaning this test can be used) for the duration of the COVID-19 declaration under Section 564(b)(1) of the Act, 21 U.S.C. section 360bbb-3(b)(1), unless the authorization is terminated or revoked sooner.    Influenza A by PCR NEGATIVE NEGATIVE Final   Influenza B by PCR NEGATIVE NEGATIVE Final    Comment: (NOTE) The Xpert Xpress SARS-CoV-2/FLU/RSV assay is intended as an aid in  the diagnosis of influenza from Nasopharyngeal swab specimens and  should not be used as a sole basis for treatment. Nasal washings and  aspirates are unacceptable for Xpert Xpress SARS-CoV-2/FLU/RSV  testing. Fact Sheet for Patients: https://www.fda.gov/media/142436/download Fact Sheet for Healthcare Providers: https://www.fda.gov/media/142435/download This test is not yet approved or cleared by the [place] FDA and  has been authorized for detection and/or diagnosis of SARS-CoV-2 by  FDA under an Emergency Use Authorization (EUA). This EUA   will remain  in effect (meaning this test can be used) for the duration of the  Covid-19 declaration under Section 564(b)(1) of the Act, 21  U.S.C. section 360bbb-3(b)(1), unless the authorization is  terminated or revoked. Performed at [facility], [address] [place], [place] [address]   Body fluid culture     Status: None   Collection Time: 06/11/19  2:03 PM   Specimen: Synovium; Synovial Fluid  Result Value Ref Range Status   Specimen Description SYNOVIAL FLUID KNEE LEFT  Final   Special Requests   Final    NONE Performed at [facility], [address] [place], [place] [address]    Gram Stain   Final    ABUNDANT WBC PRESENT,BOTH PMN AND  MONONUCLEAR NO ORGANISMS SEEN Gram Stain Report Called to,Read Back By and Verified With: DR. SWINTECK AT 1512 ON 06/11/19 BY N.Tahara Ruffini Performed at [facility], [address] [place], [place] [address]    Culture   Final    NO GROWTH 3 DAYS Performed at [facility], [address] [place], [place] [address]    Report Status 06/14/2019 FINAL  Final  Anaerobic culture     Status: None   Collection Time: 06/11/19  2:03 PM   Specimen: Synovium; Synovial Fluid  Result Value Ref Range Status   Specimen Description SYNOVIAL FLUID KNEE LEFT  Final   Special Requests   Final    NONE Performed at [facility], [address] [place], [place] [address]    Culture   Final    NO ANAEROBES ISOLATED Performed at [facility], [address] [place], [place] [address]    Report Status 06/16/2019 FINAL  Final  Aerobic/Anaerobic Culture (surgical/deep wound)     Status: None   Collection Time: 06/11/19  6:30 PM   Specimen: Synovial, Left Knee; Body Fluid  Result Value Ref Range Status   Specimen Description   Final    SYNOVIAL KNEE LEFT Performed at [facility], [address] [place], [place] [address]    Special Requests   Final    NONE Performed at [facility], [address] [place], [place] [address]    Gram Stain   Final    RARE WBC PRESENT, PREDOMINANTLY PMN NO ORGANISMS SEEN    Culture   Final    RARE STAPHYLOCOCCUS AUREUS NO ANAEROBES ISOLATED Performed at [facility], [address] [place],  [address]    Report Status 06/16/2019 FINAL  Final   Organism ID, Bacteria STAPHYLOCOCCUS AUREUS  Final      Susceptibility   Staphylococcus aureus - MIC*    CIPROFLOXACIN >=8 RESISTANT Resistant     ERYTHROMYCIN >=8 RESISTANT Resistant     GENTAMICIN <=0.5 SENSITIVE Sensitive     OXACILLIN 0.5 SENSITIVE Sensitive     TETRACYCLINE <=1 SENSITIVE Sensitive     VANCOMYCIN  <=0.5 SENSITIVE Sensitive     TRIMETH/SULFA <=10 SENSITIVE Sensitive     CLINDAMYCIN <=0.25 SENSITIVE Sensitive     RIFAMPIN <=0.5 SENSITIVE Sensitive     Inducible Clindamycin NEGATIVE Sensitive     * RARE STAPHYLOCOCCUS AUREUS  Aerobic/Anaerobic Culture (surgical/deep wound)     Status: None   Collection Time: 06/11/19  6:32 PM   Specimen: Synovium; Tissue  Result Value Ref Range Status   Specimen Description   Final    SYNOVIAL TISSUE KNEE LEFT Performed at [facility], [address] [place], [place] [address]    Special Requests   Final    NONE Performed at [facility], [address] [place], [place] [address]    Gram Stain NO WBC SEEN NO ORGANISMS SEEN   Final   Culture   Final    RARE STAPHYLOCOCCUS AUREUS NO ANAEROBES ISOLATED Performed at [facility], [address] [place], [place] [address]    Report Status 06/17/2019 FINAL  Final   Organism ID, Bacteria STAPHYLOCOCCUS AUREUS  Final      Susceptibility   Staphylococcus aureus - MIC*    CIPROFLOXACIN >=8 RESISTANT Resistant     ERYTHROMYCIN >=8 RESISTANT Resistant     GENTAMICIN <=0.5 SENSITIVE Sensitive     OXACILLIN 0.5 SENSITIVE Sensitive     TETRACYCLINE <=1 SENSITIVE Sensitive     VANCOMYCIN 1 SENSITIVE Sensitive     TRIMETH/SULFA <=10 SENSITIVE Sensitive     CLINDAMYCIN <=0.25 SENSITIVE Sensitive     RIFAMPIN <=0.5 SENSITIVE Sensitive     Inducible Clindamycin NEGATIVE Sensitive     * RARE STAPHYLOCOCCUS AUREUS  Surgical PCR screen     Status: Abnormal   Collection Time: 06/11/19  8:49 PM   Specimen: Nasal Mucosa; Nasal Swab  Result Value Ref Range Status   MRSA, PCR NEGATIVE NEGATIVE Final   Staphylococcus aureus POSITIVE (A) NEGATIVE Final    Comment: (NOTE) The Xpert SA Assay (FDA approved for NASAL specimens in patients 22 years of age and older), is one component of a comprehensive surveillance program. It is not intended to diagnose infection  nor to guide or monitor treatment. Performed at [facility], [address] [place], [place] [address]   Culture, blood (routine x 2)     Status: Abnormal   Collection Time: 06/13/19 11:53 AM   Specimen: BLOOD LEFT HAND  Result Value Ref Range Status   Specimen Description   Final    BLOOD LEFT HAND Performed at [facility], [address] [place], [place] [address]    Special Requests   Final    BOTTLES DRAWN AEROBIC AND ANAEROBIC Blood Culture adequate volume Performed at [facility], [address] [place], [place] [address]    Culture  Setup Time   Final    GRAM POSITIVE COCCI IN CLUSTERS AEROBIC BOTTLE ONLY CRITICAL RESULT CALLED TO, READ BACK BY AND VERIFIED WITH: PHARMD C SHADE [id number] AT 954 AM BY CM    Culture (A)  Final    STAPHYLOCOCCUS AUREUS SUSCEPTIBILITIES PERFORMED ON PREVIOUS CULTURE WITHIN THE LAST 5 DAYS. Performed at [facility], [address] [place], [place] [address]    Report Status 06/15/2019 FINAL  Final  Culture, blood (routine x 2)     Status: Abnormal   Collection Time: 06/13/19 11:53 AM   Specimen: BLOOD RIGHT HAND  Result Value Ref Range Status   Specimen Description   Final    BLOOD RIGHT HAND Performed at [facility], [address] [place], [place] [address]    Special Requests   Final    BOTTLES DRAWN AEROBIC AND ANAEROBIC Blood Culture results may not be optimal due to an inadequate volume of blood received in culture bottles Performed at [facility], [address] [place], [place] [address]    Culture  Setup Time   Final    GRAM POSITIVE COCCI IN CLUSTERS AEROBIC BOTTLE ONLY CRITICAL RESULT CALLED TO,   READ BACK BY AND VERIFIED WITH: PHARMD C SHADE [id number] AT 954 AM BY CM    Culture (A)  Final    STAPHYLOCOCCUS AUREUS SUSCEPTIBILITIES PERFORMED ON PREVIOUS CULTURE WITHIN THE LAST 5 DAYS. Performed at [facility], [address] [place], [place] [address]    Report Status 06/15/2019 FINAL  Final  Culture, blood (Routine X 2) w Reflex to ID Panel     Status: None (Preliminary result)   Collection Time: 06/16/19  4:33 PM   Specimen: BLOOD LEFT HAND  Result Value Ref Range Status   Specimen Description   Final    BLOOD LEFT HAND Performed at [facility], [address] [place], [place] [address]    Special Requests   Final    BOTTLES DRAWN AEROBIC ONLY Blood Culture adequate volume Performed at [facility], [address] [place], [place] [address]    Culture   Final    NO GROWTH 2 DAYS Performed at [facility], [address] [place], [place] [address]    Report Status PENDING  Incomplete  Culture, blood (Routine X 2) w Reflex to ID Panel     Status: None (Preliminary result)   Collection Time: 06/16/19  4:40 PM   Specimen: BLOOD LEFT HAND  Result Value Ref Range Status   Specimen Description   Final    BLOOD LEFT HAND Performed at  [facility], [address] [place], [place] [address]    Special Requests   Final    BOTTLES DRAWN AEROBIC ONLY Blood Culture results may not be optimal due to an inadequate volume of blood received in culture bottles Performed at  [facility], [address] [place], [place] [address]    Culture   Final    NO GROWTH 2 DAYS Performed at [facility], [address] [place], [place] [address]    Report Status PENDING  Incomplete         Radiology Studies: US EKG SITE RITE  Result Date: 06/18/2019 If Site Rite image not attached, placement could not be confirmed due to current cardiac rhythm.       Scheduled Meds:  Chlorhexidine Gluconate Cloth  6 each Topical Daily   enoxaparin (LOVENOX) injection  40 mg Subcutaneous Q24H   methadone  10 mg Oral Q12H   nicotine  21 mg Transdermal Daily   saccharomyces boulardii  250 mg Oral BID   sodium chloride  flush  10-40 mL Intracatheter Q12H   Continuous Infusions:  nafcillin (NAFCIL) continuous infusion 12 g (06/18/19 1841)     LOS: 3 days    Time spent: 40 minutes    Estanislao Harmon, MD [facility]   To contact the attending provider between 7A-7P or the covering provider during after hours 7P-7A, please log into the web site [email or web address] and access using universal [place] password for that web site. If you do not have the password, please call the hospital operator.  06/18/2019, 7:10 PM   

## (undated) NOTE — *Deleted (*Deleted)
Transition of Care (TOC) - Progression Note    Patient Details  Name: Michelle Doyle MRN: [id number] Date of Birth: 04/28/1990  Transition of Care (TOC) CM/SW Contact  Kathleen Vescio, LCSW Phone Number:  06/25/2019, 10:30 AM  Clinical Narrative:     TOC following. Pt status discussed in Progression with MD. Updated MD on new program for pt's with IV drug use history who need PICC line and antibiotic therapy for extended time period. Program involves discharging pt with a PICC with a locking device and pt would need to come to [facility] as an outpatient for medication administration. MD to discuss with ID. MD states pt is not medically ready for dc as she needs follow up from Orthopedics and she may need further imaging studies.  TOC will follow and assist as needed.       Expected Discharge Plan and Services                                                 Social Determinants of Health (SDOH) Interventions    Readmission Risk Interventions No flowsheet data found.  

---
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
## 2018-04-02 ENCOUNTER — Emergency Department (HOSPITAL_COMMUNITY): Payer: Self-pay

## 2018-04-02 ENCOUNTER — Encounter (HOSPITAL_COMMUNITY): Payer: Self-pay | Admitting: Emergency Medicine

## 2018-04-02 ENCOUNTER — Other Ambulatory Visit: Payer: Self-pay

## 2018-04-02 ENCOUNTER — Emergency Department (HOSPITAL_COMMUNITY)
Admission: EM | Admit: 2018-04-02 | Discharge: 2018-04-02 | Disposition: A | Discharge status: Home / Self Care | Payer: Self-pay | Attending: Emergency Medicine | Admitting: Emergency Medicine

## 2018-04-02 DIAGNOSIS — J45909 Unspecified asthma, uncomplicated: Secondary | ICD-10-CM | POA: Insufficient documentation

## 2018-04-02 DIAGNOSIS — F17210 Nicotine dependence, cigarettes, uncomplicated: Secondary | ICD-10-CM | POA: Insufficient documentation

## 2018-04-02 DIAGNOSIS — N12 Tubulo-interstitial nephritis, not specified as acute or chronic: Secondary | ICD-10-CM

## 2018-04-02 DIAGNOSIS — N10 Acute tubulo-interstitial nephritis: Secondary | ICD-10-CM | POA: Insufficient documentation

## 2018-04-02 LAB — URINALYSIS, ROUTINE W REFLEX MICROSCOPIC
BILIRUBIN URINE: NEGATIVE
Glucose, UA: NEGATIVE mg/dL
Ketones, ur: NEGATIVE mg/dL
Nitrite: POSITIVE — AB
Protein, ur: 100 mg/dL — AB
Specific Gravity, Urine: 1.011 (ref 1.005–1.030)
WBC, UA: >50 WBC/hpf — ABNORMAL HIGH (ref 0–5)
pH: 7 (ref 5.0–8.0)

## 2018-04-02 LAB — PREGNANCY, URINE: Preg Test, Ur: NEGATIVE

## 2018-04-02 LAB — INFLUENZA PANEL BY PCR (TYPE A & B)
Influenza A By PCR: NEGATIVE
Influenza B By PCR: NEGATIVE

## 2018-04-02 LAB — GROUP A STREP BY PCR: Group A Strep by PCR: NOT DETECTED

## 2018-04-02 MED ORDER — CEPHALEXIN 250 MG PO CAPS
1000.0000 mg | ORAL_CAPSULE | Freq: Once | ORAL | Status: AC
  Administered 2018-04-02: 1000 mg via ORAL
  Filled 2018-04-02: qty 4

## 2018-04-02 MED ORDER — IBUPROFEN 600 MG PO TABS: 600.0000 mg | ORAL_TABLET | Freq: Three times a day (TID) | ORAL | 0 refills | Status: DC | PRN

## 2018-04-02 MED ORDER — ACETAMINOPHEN 500 MG PO TABS: 1000.0000 mg | ORAL_TABLET | Freq: Four times a day (QID) | ORAL | 0 refills | Status: DC | PRN

## 2018-04-02 MED ORDER — ONDANSETRON 4 MG PO TBDP: 4.0000 mg | ORAL_TABLET | ORAL | 0 refills | Status: DC | PRN

## 2018-04-02 MED ORDER — ACETAMINOPHEN 500 MG PO TABS
1000.0000 mg | ORAL_TABLET | Freq: Once | ORAL | Status: AC
  Administered 2018-04-02: 1000 mg via ORAL
  Filled 2018-04-02: qty 2

## 2018-04-02 MED ORDER — CEPHALEXIN 500 MG PO CAPS: 1000.0000 mg | ORAL_CAPSULE | Freq: Two times a day (BID) | ORAL | 0 refills | Status: DC

## 2018-04-02 MED ORDER — IBUPROFEN 800 MG PO TABS
800.0000 mg | ORAL_TABLET | Freq: Once | ORAL | Status: AC
  Administered 2018-04-02: 800 mg via ORAL
  Filled 2018-04-02: qty 1

## 2018-04-02 NOTE — ED Notes (Signed)
Patient verbalizes understanding of medications and discharge instructions. No further questions at this time. VSS and patient ambulatory at discharge.   

## 2018-04-02 NOTE — ED Triage Notes (Signed)
C/o chills, generalized body aches, productive cough with yellow sputum, and nasal congestion x 2 days.

## 2018-04-02 NOTE — ED Notes (Signed)
Patient given cheese/crackers and PO fluids. Tolerating well at this time.

## 2018-04-02 NOTE — Discharge Instructions (Addendum)
1.  Take Keflex twice daily as prescribed.  Take Zofran if needed for nausea.  Take ibuprofen and Tylenol for body aches or fever. 2.  Return to the emergency department if you develop vomiting and cannot take your antibiotics or you are worsening. 3.  Is important that you schedule a follow-up appointment.  Use the referral number in your discharge instructions to find a family doctor if you do not have one.

## 2018-04-02 NOTE — ED Provider Notes (Signed)
[facility] DEPARTMENT Provider Note   CSN: [id number] Arrival date & time: 04/02/18  1813     History   Chief Complaint Chief Complaint  Patient presents with  . flu symptoms    HPI Michelle Doyle is a 28 y.o. female.  HPI Patient reports that she has had 2 days of generalized body aches and fevers.  She reports that onset fever was up to 101.  She reports she has had some coughing but is been nonproductive and without chest pain.  No shortness of breath.  She reports generalized nausea but no vomiting.  Some crampy abdominal discomfort but no significant pain.  No diarrhea.  Reports her low back is slightly achy, she indicates the area diffusely across her very low back.  No weakness numbness or tingling to the legs.  No difficulty urinating.  She denies having pain or burning with urination.  She denies skin rashes.  No headache.  No visual changes.  Mild sore throat no other URI symptoms.  Patient reports she did not get a flu shot this year.  She reports she normally does but failed to do so this year.  Her boyfriend reports that he had a sore throat last week and thought he has strep throat but it got better on its own.  She denies other sick contact.  Patient denies alcohol use.  She reports she does smoke.  She denies drug or alcohol use.  She denies other active medical problems. Past Medical History:  Diagnosis Date  . Asthma   . Breast mass in female November 2012   left breast mass     There are no active problems to display for this patient.   Past Surgical History:  Procedure Laterality Date  . TONSILLECTOMY AND ADENOIDECTOMY  2002     OB History   No obstetric history on file.      Home Medications    Prior to Admission medications   Medication Sig Start Date End Date Taking? Authorizing Provider  acetaminophen (TYLENOL) 500 MG tablet Take 2 tablets (1,000 mg total) by mouth every 6 (six) hours as needed. 04/02/18   Charlesetta Shanks, MD  cephALEXin (KEFLEX) 500 MG capsule Take 2 capsules (1,000 mg total) by mouth 2 (two) times daily. 04/02/18   Charlesetta Shanks, MD  ibuprofen (ADVIL,MOTRIN) 600 MG tablet Take 1 tablet (600 mg total) by mouth every 8 (eight) hours as needed for fever. 04/02/18   Charlesetta Shanks, MD  ondansetron (ZOFRAN ODT) 4 MG disintegrating tablet Take 1 tablet (4 mg total) by mouth every 4 (four) hours as needed for nausea or vomiting. 04/02/18   Charlesetta Shanks, MD    Family History No family history on file.  Social History Social History   Tobacco Use  . Smoking status: Current Every Day Smoker    Packs/day: 0.50  . Smokeless tobacco: Never Used  Substance Use Topics  . Alcohol use: No  . Drug use: No     Allergies   Patient has no known allergies.   Review of Systems Review of Systems 10 Systems reviewed and are negative for acute change except as noted in the HPI.   Physical Exam Updated Vital Signs BP 109/65 (BP Location: Right Arm)   Pulse 88   Temp 98.8 F (37.1 C) (Oral)   Resp 20   LMP 03/28/2018   SpO2 98%   Physical Exam Constitutional:      Comments: Patient is alert and nontoxic.  Mental status is clear.  No respiratory distress.  Color good.  HENT:     Head: Normocephalic and atraumatic.     Comments: Bilateral TMs tympanic sclerosis but no erythema or bulging.  Mucous membranes are pink and moist.  Posterior oropharynx widely patent.  Fever blister on the lower right lip.  Uncomplicated in appearance without erythema.  Approximately 4 mm    Nose: Nose normal.  Eyes:     Extraocular Movements: Extraocular movements intact.     Conjunctiva/sclera: Conjunctivae normal.     Pupils: Pupils are equal, round, and reactive to light.  Neck:     Musculoskeletal: Neck supple.  Cardiovascular:     Comments: Borderline tachycardia.  No rub murmur gallop. Pulmonary:     Effort: Pulmonary effort is normal.     Breath sounds: Normal breath sounds.  Abdominal:      General: There is no distension.     Palpations: Abdomen is soft.     Tenderness: There is no abdominal tenderness. There is no guarding.  Musculoskeletal: Normal range of motion.        General: No swelling or tenderness.     Right lower leg: No edema.     Left lower leg: No edema.     Comments: No peripheral edema.  Calves are soft and nontender.  No CVA tenderness to percussion.  No bony point tenderness of the vertebral bodies.  Patient indicates that back discomfort is low along her very low back along the sacrum and iliacs.  No palpable or percussion reproducible pain.  Skin:    General: Skin is warm and dry.     Findings: No rash.  Neurological:     General: No focal deficit present.     Mental Status: She is oriented to person, place, and time.     Coordination: Coordination normal.  Psychiatric:        Mood and Affect: Mood normal.      ED Treatments / Results  Labs (all labs ordered are listed, but only abnormal results are displayed) Labs Reviewed  URINALYSIS, ROUTINE W REFLEX MICROSCOPIC - Abnormal; Notable for the following components:      Result Value   APPearance CLOUDY (*)    Hgb urine dipstick SMALL (*)    Protein, ur 100 (*)    Nitrite POSITIVE (*)    Leukocytes, UA LARGE (*)    WBC, UA >50 (*)    Bacteria, UA FEW (*)    All other components within normal limits  GROUP A STREP BY PCR  URINE CULTURE  PREGNANCY, URINE  INFLUENZA PANEL BY PCR (TYPE A & B)    EKG None  Radiology Dg Chest 2 View  Result Date: 04/02/2018 CLINICAL DATA:  Cough and fever. EXAM: CHEST - 2 VIEW COMPARISON:  None. FINDINGS: The cardiomediastinal contours are normal. The lungs are clear. Pulmonary vasculature is normal. No consolidation, pleural effusion, or pneumothorax. No acute osseous abnormalities are seen. IMPRESSION: Normal radiographs of the chest. Electronically Signed   By: Keith Rake M.D.   On: 04/02/2018 20:13    Procedures Procedures (including critical  care time)  Medications Ordered in ED Medications  ibuprofen (ADVIL,MOTRIN) tablet 800 mg (800 mg Oral Given 04/02/18 1947)  acetaminophen (TYLENOL) tablet 1,000 mg (1,000 mg Oral Given 04/02/18 1947)  cephALEXin (KEFLEX) capsule 1,000 mg (1,000 mg Oral Given 04/02/18 2342)     Initial Impression / Assessment and Plan / ED Course  I have reviewed the triage vital  signs and the nursing notes.  Pertinent labs & imaging results that were available during my care of the patient were reviewed by me and considered in my medical decision making (see chart for details).  Clinical Course as of Apr 03 2351  Wed Apr 02, 2018  2135 Recheck: Patient is well in appearance.  She is alert and nontoxic.  She is eating and drinking.  Chatting with her companion in looking at her phone.  No distress.   [MP]    Clinical Course User Index [MP] Charlesetta Shanks, MD   Patient presented with generalized symptoms of body aches chills and fever.  Patient reported low back pain.  On exam, she had no CVA tenderness or abdominal pain to palpation.  Patient's urinalysis is grossly positive.  Findings consistent with pyelonephritis.  Patient however is clinically well without vomiting, abdominal pain or flank tenderness.  At this time, I feel he is stable to initiate outpatient antibiotics.  She is eating and drinking in the emergency department without difficulty.  Return precautions reviewed.  Final Clinical Impressions(s) / ED Diagnoses   Final diagnoses:  Pyelonephritis    ED Discharge Orders         Ordered    cephALEXin (KEFLEX) 500 MG capsule  2 times daily     04/02/18 2332    acetaminophen (TYLENOL) 500 MG tablet  Every 6 hours PRN     04/02/18 2332    ibuprofen (ADVIL,MOTRIN) 600 MG tablet  Every 8 hours PRN     04/02/18 2332    ondansetron (ZOFRAN ODT) 4 MG disintegrating tablet  Every 4 hours PRN     04/02/18 2348           Charlesetta Shanks, MD 04/02/18 2353

## 2018-04-05 LAB — URINE CULTURE: Culture: >=100000 — AB

## 2018-04-06 ENCOUNTER — Telehealth: Payer: Self-pay | Admitting: Emergency Medicine

## 2018-04-06 NOTE — Telephone Encounter (Signed)
Post ED Visit - Positive Culture Follow-up  Culture report reviewed by antimicrobial stewardship pharmacist:  []  Elenor Quinones, Pharm.D. []  Heide Guile, Pharm.D., BCPS AQ-ID [x]  Parks Neptune, Pharm.D., BCPS []  Alycia Rossetti, Pharm.D., BCPS []  [place], Pharm.D., BCPS, AAHIVP []  Legrand Como, Pharm.D., BCPS, AAHIVP []  Salome Arnt, PharmD, BCPS []  Johnnette Gourd, PharmD, BCPS []  Hughes Better, PharmD, BCPS []  Leeroy Cha, PharmD  Positive urine culture Treated with Cephalexin, organism sensitive to the same and no further patient follow-up is required at this time.  Michelle Doyle 04/06/2018, 12:57 PM

## 2019-06-10 ENCOUNTER — Other Ambulatory Visit: Payer: Self-pay

## 2019-06-10 ENCOUNTER — Inpatient Hospital Stay (HOSPITAL_COMMUNITY)
Admission: EM | Admit: 2019-06-10 | Discharge: 2019-06-15 | DRG: 853 | Discharge status: Against Medical Advice | Payer: Self-pay | Attending: Internal Medicine | Admitting: Internal Medicine

## 2019-06-10 DIAGNOSIS — J159 Unspecified bacterial pneumonia: Secondary | ICD-10-CM | POA: Diagnosis present

## 2019-06-10 DIAGNOSIS — M009 Pyogenic arthritis, unspecified: Secondary | ICD-10-CM | POA: Diagnosis present

## 2019-06-10 DIAGNOSIS — F17210 Nicotine dependence, cigarettes, uncomplicated: Secondary | ICD-10-CM | POA: Diagnosis present

## 2019-06-10 DIAGNOSIS — Z9119 Patient's noncompliance with other medical treatment and regimen: Secondary | ICD-10-CM

## 2019-06-10 DIAGNOSIS — B1920 Unspecified viral hepatitis C without hepatic coma: Secondary | ICD-10-CM | POA: Diagnosis present

## 2019-06-10 DIAGNOSIS — R509 Fever, unspecified: Secondary | ICD-10-CM

## 2019-06-10 DIAGNOSIS — D735 Infarction of spleen: Secondary | ICD-10-CM | POA: Diagnosis present

## 2019-06-10 DIAGNOSIS — B9561 Methicillin susceptible Staphylococcus aureus infection as the cause of diseases classified elsewhere: Secondary | ICD-10-CM | POA: Diagnosis present

## 2019-06-10 DIAGNOSIS — E876 Hypokalemia: Secondary | ICD-10-CM | POA: Diagnosis present

## 2019-06-10 DIAGNOSIS — Z5329 Procedure and treatment not carried out because of patient's decision for other reasons: Secondary | ICD-10-CM | POA: Diagnosis present

## 2019-06-10 DIAGNOSIS — Z20822 Contact with and (suspected) exposure to covid-19: Secondary | ICD-10-CM | POA: Diagnosis present

## 2019-06-10 DIAGNOSIS — I330 Acute and subacute infective endocarditis: Secondary | ICD-10-CM | POA: Diagnosis present

## 2019-06-10 DIAGNOSIS — G894 Chronic pain syndrome: Secondary | ICD-10-CM | POA: Diagnosis present

## 2019-06-10 DIAGNOSIS — I2690 Septic pulmonary embolism without acute cor pulmonale: Secondary | ICD-10-CM | POA: Diagnosis present

## 2019-06-10 DIAGNOSIS — E871 Hypo-osmolality and hyponatremia: Secondary | ICD-10-CM | POA: Diagnosis present

## 2019-06-10 DIAGNOSIS — I76 Septic arterial embolism: Secondary | ICD-10-CM | POA: Diagnosis present

## 2019-06-10 DIAGNOSIS — A419 Sepsis, unspecified organism: Secondary | ICD-10-CM

## 2019-06-10 DIAGNOSIS — E872 Acidosis: Secondary | ICD-10-CM | POA: Diagnosis present

## 2019-06-10 DIAGNOSIS — F1113 Opioid abuse with withdrawal: Secondary | ICD-10-CM | POA: Diagnosis present

## 2019-06-10 DIAGNOSIS — Z590 Homelessness: Secondary | ICD-10-CM

## 2019-06-10 DIAGNOSIS — M25521 Pain in right elbow: Secondary | ICD-10-CM | POA: Diagnosis present

## 2019-06-10 DIAGNOSIS — A4101 Sepsis due to Methicillin susceptible Staphylococcus aureus: Principal | ICD-10-CM | POA: Diagnosis present

## 2019-06-10 DIAGNOSIS — F1510 Other stimulant abuse, uncomplicated: Secondary | ICD-10-CM | POA: Diagnosis present

## 2019-06-10 DIAGNOSIS — R45851 Suicidal ideations: Secondary | ICD-10-CM | POA: Diagnosis present

## 2019-06-10 DIAGNOSIS — R52 Pain, unspecified: Secondary | ICD-10-CM

## 2019-06-10 NOTE — ED Triage Notes (Signed)
Pt arrives from a motel via [facility] with c/o withdrawls from heroin. Per EMS report, She does not feel well, overall pain, says has not ate or drank for 3 days.  HR 160's, 101/61, cbg 117, resp 17.

## 2019-06-10 NOTE — ED Triage Notes (Signed)
Pt says that she has not felt well for several days. Last heroin use about 3 days. Temp today, says she feels very dehydrated.

## 2019-06-11 ENCOUNTER — Encounter (HOSPITAL_COMMUNITY)
Admission: EM | Discharge status: Against Medical Advice | Payer: Self-pay | Source: Home / Self Care | Attending: Internal Medicine

## 2019-06-11 ENCOUNTER — Emergency Department (HOSPITAL_COMMUNITY): Payer: Self-pay

## 2019-06-11 ENCOUNTER — Inpatient Hospital Stay (HOSPITAL_COMMUNITY): Payer: Self-pay | Admitting: Certified Registered Nurse Anesthetist

## 2019-06-11 ENCOUNTER — Inpatient Hospital Stay (HOSPITAL_COMMUNITY): Payer: Self-pay

## 2019-06-11 DIAGNOSIS — I361 Nonrheumatic tricuspid (valve) insufficiency: Secondary | ICD-10-CM

## 2019-06-11 DIAGNOSIS — A419 Sepsis, unspecified organism: Secondary | ICD-10-CM | POA: Diagnosis present

## 2019-06-11 DIAGNOSIS — M00062 Staphylococcal arthritis, left knee: Secondary | ICD-10-CM

## 2019-06-11 DIAGNOSIS — I38 Endocarditis, valve unspecified: Secondary | ICD-10-CM | POA: Diagnosis present

## 2019-06-11 DIAGNOSIS — I330 Acute and subacute infective endocarditis: Secondary | ICD-10-CM

## 2019-06-11 DIAGNOSIS — I340 Nonrheumatic mitral (valve) insufficiency: Secondary | ICD-10-CM

## 2019-06-11 HISTORY — PX: IRRIGATION AND DEBRIDEMENT KNEE: SHX5185

## 2019-06-11 LAB — BLOOD CULTURE ID PANEL (REFLEXED)

## 2019-06-11 LAB — HIV ANTIBODY (ROUTINE TESTING W REFLEX): HIV Screen 4th Generation wRfx: NONREACTIVE

## 2019-06-11 LAB — ECHOCARDIOGRAM COMPLETE
Height: 67 in
Weight: 1760 oz

## 2019-06-11 LAB — URINALYSIS, ROUTINE W REFLEX MICROSCOPIC
Bilirubin Urine: NEGATIVE
Glucose, UA: NEGATIVE mg/dL
Ketones, ur: NEGATIVE mg/dL
Nitrite: NEGATIVE
Protein, ur: 30 mg/dL — AB
Specific Gravity, Urine: 1.008 (ref 1.005–1.030)
WBC, UA: >50 WBC/hpf — ABNORMAL HIGH (ref 0–5)
pH: 5 (ref 5.0–8.0)

## 2019-06-11 LAB — CBC WITH DIFFERENTIAL/PLATELET
Abs Immature Granulocytes: 0.92 10*3/uL — ABNORMAL HIGH (ref 0.00–0.07)
Basophils Absolute: 0.1 10*3/uL (ref 0.0–0.1)
Basophils Relative: 0 %
Eosinophils Absolute: 0 10*3/uL (ref 0.0–0.5)
Eosinophils Relative: 0 %
HCT: 27.7 % — ABNORMAL LOW (ref 36.0–46.0)
Hemoglobin: 9.4 g/dL — ABNORMAL LOW (ref 12.0–15.0)
Immature Granulocytes: 4 %
Lymphocytes Relative: 5 %
Lymphs Abs: 1.1 10*3/uL (ref 0.7–4.0)
MCH: 26.6 pg (ref 26.0–34.0)
MCHC: 33.9 g/dL (ref 30.0–36.0)
MCV: 78.2 fL — ABNORMAL LOW (ref 80.0–100.0)
Monocytes Absolute: 1.3 10*3/uL — ABNORMAL HIGH (ref 0.1–1.0)
Monocytes Relative: 5 %
Neutro Abs: 21.1 10*3/uL — ABNORMAL HIGH (ref 1.7–7.7)
Neutrophils Relative %: 86 %
Platelets: 135 10*3/uL — ABNORMAL LOW (ref 150–400)
RBC: 3.54 MIL/uL — ABNORMAL LOW (ref 3.87–5.11)
RDW: 14.7 % (ref 11.5–15.5)
WBC: 24.6 10*3/uL — ABNORMAL HIGH (ref 4.0–10.5)
nRBC: 0 % (ref 0.0–0.2)

## 2019-06-11 LAB — RESPIRATORY PANEL BY RT PCR (FLU A&B, COVID)
Influenza A by PCR: NEGATIVE
Influenza B by PCR: NEGATIVE
SARS Coronavirus 2 by RT PCR: NEGATIVE

## 2019-06-11 LAB — SYNOVIAL CELL COUNT + DIFF, W/ CRYSTALS
Crystals, Fluid: NONE SEEN
Eosinophils-Synovial: 0 % (ref 0–1)
Lymphocytes-Synovial Fld: 1 % (ref 0–20)
Monocyte-Macrophage-Synovial Fluid: 7 % — ABNORMAL LOW (ref 50–90)
Neutrophil, Synovial: 92 % — ABNORMAL HIGH (ref 0–25)
WBC, Synovial: 21500 /mm3 — ABNORMAL HIGH (ref 0–200)

## 2019-06-11 LAB — COMPREHENSIVE METABOLIC PANEL
ALT: 47 U/L — ABNORMAL HIGH (ref 0–44)
AST: 162 U/L — ABNORMAL HIGH (ref 15–41)
Albumin: 2 g/dL — ABNORMAL LOW (ref 3.5–5.0)
Alkaline Phosphatase: 139 U/L — ABNORMAL HIGH (ref 38–126)
Anion gap: 13 (ref 5–15)
BUN: 27 mg/dL — ABNORMAL HIGH (ref 6–20)
CO2: 25 mmol/L (ref 22–32)
Calcium: 7.8 mg/dL — ABNORMAL LOW (ref 8.9–10.3)
Chloride: 89 mmol/L — ABNORMAL LOW (ref 98–111)
Creatinine, Ser: 0.76 mg/dL (ref 0.44–1.00)
GFR calc Af Amer: >60 mL/min (ref >60)
GFR calc non Af Amer: >60 mL/min (ref >60)
Glucose, Bld: 109 mg/dL — ABNORMAL HIGH (ref 70–99)
Potassium: 3.2 mmol/L — ABNORMAL LOW (ref 3.5–5.1)
Sodium: 127 mmol/L — ABNORMAL LOW (ref 135–145)
Total Bilirubin: 1.3 mg/dL — ABNORMAL HIGH (ref 0.3–1.2)
Total Protein: 7.6 g/dL (ref 6.5–8.1)

## 2019-06-11 LAB — TROPONIN I (HIGH SENSITIVITY)
Troponin I (High Sensitivity): 92 ng/L — ABNORMAL HIGH (ref <18)
Troponin I (High Sensitivity): 108 ng/L (ref <18)

## 2019-06-11 LAB — PROTIME-INR
INR: 1.2 (ref 0.8–1.2)
Prothrombin Time: 15.1 seconds (ref 11.4–15.2)

## 2019-06-11 LAB — RAPID URINE DRUG SCREEN, HOSP PERFORMED
Amphetamines: POSITIVE — AB
Barbiturates: NOT DETECTED
Benzodiazepines: NOT DETECTED
Cocaine: NOT DETECTED
Opiates: NOT DETECTED
Tetrahydrocannabinol: NOT DETECTED

## 2019-06-11 LAB — LACTIC ACID, PLASMA
Lactic Acid, Venous: 1.9 mmol/L (ref 0.5–1.9)
Lactic Acid, Venous: 2.6 mmol/L (ref 0.5–1.9)

## 2019-06-11 LAB — I-STAT BETA HCG BLOOD, ED (MC, WL, AP ONLY): I-stat hCG, quantitative: <5 m[IU]/mL (ref <5)

## 2019-06-11 LAB — SARS CORONAVIRUS 2 (TAT 6-24 HRS): SARS Coronavirus 2: NEGATIVE

## 2019-06-11 LAB — APTT: aPTT: 35 seconds (ref 24–36)

## 2019-06-11 LAB — CK: Total CK: 146 U/L (ref 38–234)

## 2019-06-11 SURGERY — IRRIGATION AND DEBRIDEMENT KNEE
Anesthesia: General | Site: Knee | Laterality: Left

## 2019-06-11 MED ORDER — MUPIROCIN 2 % EX OINT
1.0000 "application " | TOPICAL_OINTMENT | Freq: Two times a day (BID) | CUTANEOUS | Status: DC
  Administered 2019-06-12 – 2019-06-15 (×8): 1 via NASAL
  Filled 2019-06-11 (×2): qty 22

## 2019-06-11 MED ORDER — IOHEXOL 300 MG/ML  SOLN
100.0000 mL | Freq: Once | INTRAMUSCULAR | Status: AC | PRN
  Administered 2019-06-11: 80 mL via INTRAVENOUS

## 2019-06-11 MED ORDER — CHLORHEXIDINE GLUCONATE 4 % EX LIQD
60.0000 mL | Freq: Once | CUTANEOUS | Status: DC
  Filled 2019-06-11: qty 60

## 2019-06-11 MED ORDER — MIDAZOLAM HCL 2 MG/2ML IJ SOLN
INTRAMUSCULAR | Status: AC
  Filled 2019-06-11: qty 2

## 2019-06-11 MED ORDER — ACETAMINOPHEN 325 MG PO TABS
650.0000 mg | ORAL_TABLET | Freq: Once | ORAL | Status: AC
  Administered 2019-06-11: 650 mg via ORAL
  Filled 2019-06-11: qty 2

## 2019-06-11 MED ORDER — SODIUM CHLORIDE 0.9 % IV SOLN
2.0000 g | Freq: Once | INTRAVENOUS | Status: AC
  Administered 2019-06-11: 2 g via INTRAVENOUS
  Filled 2019-06-11: qty 2

## 2019-06-11 MED ORDER — DEXMEDETOMIDINE HCL IN NACL 200 MCG/50ML IV SOLN
INTRAVENOUS | Status: AC
  Filled 2019-06-11: qty 50

## 2019-06-11 MED ORDER — KETAMINE HCL 10 MG/ML IJ SOLN
INTRAMUSCULAR | Status: DC | PRN
  Administered 2019-06-11 (×2): 20 mg via INTRAVENOUS

## 2019-06-11 MED ORDER — POVIDONE-IODINE 10 % EX SWAB: 2.0000 "application " | Freq: Once | CUTANEOUS | Status: DC

## 2019-06-11 MED ORDER — POTASSIUM CHLORIDE CRYS ER 20 MEQ PO TBCR: 40.0000 meq | EXTENDED_RELEASE_TABLET | Freq: Once | ORAL | Status: DC

## 2019-06-11 MED ORDER — FENTANYL CITRATE (PF) 100 MCG/2ML IJ SOLN
INTRAMUSCULAR | Status: AC
  Filled 2019-06-11: qty 2

## 2019-06-11 MED ORDER — DIPHENOXYLATE-ATROPINE 2.5-0.025 MG PO TABS
1.0000 | ORAL_TABLET | Freq: Four times a day (QID) | ORAL | Status: DC | PRN
  Administered 2019-06-13: 1 via ORAL
  Filled 2019-06-11: qty 1

## 2019-06-11 MED ORDER — ALBUMIN HUMAN 5 % IV SOLN: INTRAVENOUS | Status: DC | PRN

## 2019-06-11 MED ORDER — MIDAZOLAM HCL 5 MG/5ML IJ SOLN
INTRAMUSCULAR | Status: DC | PRN
  Administered 2019-06-11: 1 mg via INTRAVENOUS
  Administered 2019-06-11: 2 mg via INTRAVENOUS
  Administered 2019-06-11: 1 mg via INTRAVENOUS

## 2019-06-11 MED ORDER — CEFAZOLIN SODIUM-DEXTROSE 2-4 GM/100ML-% IV SOLN
2.0000 g | INTRAVENOUS | Status: DC
  Filled 2019-06-11 (×2): qty 100

## 2019-06-11 MED ORDER — ONDANSETRON HCL 4 MG/2ML IJ SOLN
4.0000 mg | Freq: Four times a day (QID) | INTRAMUSCULAR | Status: DC | PRN
  Administered 2019-06-14: 4 mg via INTRAVENOUS
  Filled 2019-06-11: qty 2

## 2019-06-11 MED ORDER — METOCLOPRAMIDE HCL 5 MG PO TABS: 5.0000 mg | ORAL_TABLET | Freq: Three times a day (TID) | ORAL | Status: DC | PRN

## 2019-06-11 MED ORDER — POLYETHYLENE GLYCOL 3350 17 G PO PACK: 17.0000 g | PACK | Freq: Every day | ORAL | Status: DC | PRN

## 2019-06-11 MED ORDER — BUPIVACAINE-EPINEPHRINE (PF) 0.5% -1:200000 IJ SOLN
INTRAMUSCULAR | Status: DC | PRN
  Administered 2019-06-11: 20 mL via PERINEURAL

## 2019-06-11 MED ORDER — DEXAMETHASONE SODIUM PHOSPHATE 4 MG/ML IJ SOLN
INTRAMUSCULAR | Status: DC | PRN
  Administered 2019-06-11: 8 mg via INTRAVENOUS

## 2019-06-11 MED ORDER — CEFAZOLIN SODIUM-DEXTROSE 2-4 GM/100ML-% IV SOLN
2.0000 g | Freq: Three times a day (TID) | INTRAVENOUS | Status: DC
  Administered 2019-06-11 – 2019-06-12 (×3): 2 g via INTRAVENOUS
  Filled 2019-06-11 (×3): qty 100

## 2019-06-11 MED ORDER — ONDANSETRON HCL 4 MG/2ML IJ SOLN
4.0000 mg | Freq: Once | INTRAMUSCULAR | Status: AC
  Administered 2019-06-11: 4 mg via INTRAVENOUS
  Filled 2019-06-11: qty 2

## 2019-06-11 MED ORDER — SODIUM CHLORIDE (PF) 0.9 % IJ SOLN
INTRAMUSCULAR | Status: AC
  Filled 2019-06-11: qty 50

## 2019-06-11 MED ORDER — KETAMINE HCL 10 MG/ML IJ SOLN
INTRAMUSCULAR | Status: AC
  Filled 2019-06-11: qty 1

## 2019-06-11 MED ORDER — DEXMEDETOMIDINE HCL IN NACL 200 MCG/50ML IV SOLN
INTRAVENOUS | Status: DC | PRN
  Administered 2019-06-11: 20 ug via INTRAVENOUS

## 2019-06-11 MED ORDER — ENOXAPARIN SODIUM 40 MG/0.4ML ~~LOC~~ SOLN
40.0000 mg | SUBCUTANEOUS | Status: DC
  Administered 2019-06-12 – 2019-06-13 (×2): 40 mg via SUBCUTANEOUS
  Filled 2019-06-11 (×3): qty 0.4

## 2019-06-11 MED ORDER — ONDANSETRON HCL 4 MG/2ML IJ SOLN
4.0000 mg | Freq: Four times a day (QID) | INTRAMUSCULAR | Status: DC | PRN
  Administered 2019-06-11: 4 mg via INTRAVENOUS
  Filled 2019-06-11: qty 2

## 2019-06-11 MED ORDER — OXYCODONE-ACETAMINOPHEN 7.5-325 MG PO TABS
1.0000 | ORAL_TABLET | Freq: Four times a day (QID) | ORAL | Status: DC | PRN
  Administered 2019-06-11 – 2019-06-15 (×13): 1 via ORAL
  Filled 2019-06-11 (×14): qty 1

## 2019-06-11 MED ORDER — VANCOMYCIN HCL IN DEXTROSE 1-5 GM/200ML-% IV SOLN: 1000.0000 mg | INTRAVENOUS | Status: DC

## 2019-06-11 MED ORDER — PROPOFOL 10 MG/ML IV BOLUS
INTRAVENOUS | Status: DC | PRN
  Administered 2019-06-11: 150 mg via INTRAVENOUS

## 2019-06-11 MED ORDER — SODIUM CHLORIDE 0.9 % IV BOLUS
1000.0000 mL | Freq: Once | INTRAVENOUS | Status: AC
  Administered 2019-06-11: 1000 mL via INTRAVENOUS

## 2019-06-11 MED ORDER — SODIUM CHLORIDE 0.9 % IR SOLN
Status: DC | PRN
  Administered 2019-06-11: 9000 mL

## 2019-06-11 MED ORDER — LIDOCAINE HCL (PF) 1 % IJ SOLN
5.0000 mL | Freq: Once | INTRAMUSCULAR | Status: AC
  Administered 2019-06-11: 5 mL
  Filled 2019-06-11: qty 30

## 2019-06-11 MED ORDER — FAMOTIDINE IN NACL 20-0.9 MG/50ML-% IV SOLN
20.0000 mg | Freq: Two times a day (BID) | INTRAVENOUS | Status: DC
  Administered 2019-06-11 – 2019-06-12 (×2): 20 mg via INTRAVENOUS
  Filled 2019-06-11 (×3): qty 50

## 2019-06-11 MED ORDER — ONDANSETRON HCL 4 MG PO TABS: 4.0000 mg | ORAL_TABLET | Freq: Four times a day (QID) | ORAL | Status: DC | PRN

## 2019-06-11 MED ORDER — VANCOMYCIN HCL IN DEXTROSE 1-5 GM/200ML-% IV SOLN
1000.0000 mg | Freq: Once | INTRAVENOUS | Status: AC
  Administered 2019-06-11: 1000 mg via INTRAVENOUS
  Filled 2019-06-11: qty 200

## 2019-06-11 MED ORDER — LIDOCAINE HCL (CARDIAC) PF 100 MG/5ML IV SOSY
PREFILLED_SYRINGE | INTRAVENOUS | Status: DC | PRN
  Administered 2019-06-11: 60 mg via INTRAVENOUS

## 2019-06-11 MED ORDER — METOCLOPRAMIDE HCL 5 MG/ML IJ SOLN: 5.0000 mg | Freq: Three times a day (TID) | INTRAMUSCULAR | Status: DC | PRN

## 2019-06-11 MED ORDER — FENTANYL CITRATE (PF) 100 MCG/2ML IJ SOLN
INTRAMUSCULAR | Status: DC | PRN
  Administered 2019-06-11 (×2): 50 ug via INTRAVENOUS
  Administered 2019-06-11: 100 ug via INTRAVENOUS

## 2019-06-11 MED ORDER — DOCUSATE SODIUM 100 MG PO CAPS
100.0000 mg | ORAL_CAPSULE | Freq: Two times a day (BID) | ORAL | Status: DC
  Administered 2019-06-12 – 2019-06-14 (×5): 100 mg via ORAL
  Filled 2019-06-11 (×6): qty 1

## 2019-06-11 MED ORDER — HYDROMORPHONE HCL 1 MG/ML IJ SOLN: 0.2500 mg | INTRAMUSCULAR | Status: DC | PRN

## 2019-06-11 MED ORDER — LACTATED RINGERS IV BOLUS
1000.0000 mL | Freq: Once | INTRAVENOUS | Status: AC
  Administered 2019-06-11: 1000 mL via INTRAVENOUS

## 2019-06-11 MED ORDER — DOCUSATE SODIUM 100 MG PO CAPS: 100.0000 mg | ORAL_CAPSULE | Freq: Two times a day (BID) | ORAL | Status: DC | PRN

## 2019-06-11 MED ORDER — SODIUM CHLORIDE 0.9 % IV SOLN
2.0000 g | Freq: Three times a day (TID) | INTRAVENOUS | Status: DC
  Administered 2019-06-11: 2 g via INTRAVENOUS
  Filled 2019-06-11: qty 2

## 2019-06-11 MED ORDER — VANCOMYCIN HCL 750 MG/150ML IV SOLN
750.0000 mg | Freq: Two times a day (BID) | INTRAVENOUS | Status: DC
  Administered 2019-06-11: 750 mg via INTRAVENOUS
  Filled 2019-06-11 (×2): qty 150

## 2019-06-11 MED ORDER — SODIUM CHLORIDE 0.9 % IV SOLN
1.0000 g | Freq: Three times a day (TID) | INTRAVENOUS | Status: DC
  Filled 2019-06-11 (×2): qty 1

## 2019-06-11 MED ORDER — LACTATED RINGERS IV SOLN: INTRAVENOUS | Status: DC

## 2019-06-11 MED ORDER — ENOXAPARIN SODIUM 40 MG/0.4ML ~~LOC~~ SOLN
40.0000 mg | SUBCUTANEOUS | Status: DC
  Administered 2019-06-11: 40 mg via SUBCUTANEOUS
  Filled 2019-06-11: qty 0.4

## 2019-06-11 MED ORDER — MORPHINE SULFATE (PF) 2 MG/ML IV SOLN
2.0000 mg | Freq: Once | INTRAVENOUS | Status: AC
  Administered 2019-06-11: 4 mg via INTRAVENOUS
  Filled 2019-06-11: qty 2

## 2019-06-11 MED ORDER — ONDANSETRON HCL 4 MG/2ML IJ SOLN
INTRAMUSCULAR | Status: DC | PRN
  Administered 2019-06-11: 4 mg via INTRAVENOUS

## 2019-06-11 MED ORDER — NOREPINEPHRINE 4 MG/250ML-% IV SOLN
0.0000 ug/min | INTRAVENOUS | Status: DC
  Filled 2019-06-11: qty 250

## 2019-06-11 MED ORDER — PHENYLEPHRINE HCL (PRESSORS) 10 MG/ML IV SOLN
INTRAVENOUS | Status: DC | PRN
  Administered 2019-06-11: 40 ug via INTRAVENOUS
  Administered 2019-06-11: 80 ug via INTRAVENOUS

## 2019-06-11 SURGICAL SUPPLY — 49 items
ADH SKN CLS APL DERMABOND .7 (GAUZE/BANDAGES/DRESSINGS) ×1
APL PRP STRL LF DISP 70% ISPRP (MISCELLANEOUS) ×1
BAG SPEC THK2 15X12 ZIP CLS (MISCELLANEOUS) ×1
BAG ZIPLOCK 12X15 (MISCELLANEOUS) ×3 IMPLANT
BANDAGE ESMARK 6X9 LF (GAUZE/BANDAGES/DRESSINGS) ×1 IMPLANT
BNDG CMPR 9X6 STRL LF SNTH (GAUZE/BANDAGES/DRESSINGS) ×1
BNDG CMPR MED 10X6 ELC LF (GAUZE/BANDAGES/DRESSINGS) ×1
BNDG ELASTIC 6X10 VLCR STRL LF (GAUZE/BANDAGES/DRESSINGS) ×2 IMPLANT
BNDG ESMARK 6X9 LF (GAUZE/BANDAGES/DRESSINGS) ×3
CHLORAPREP W/TINT 26 (MISCELLANEOUS) ×3 IMPLANT
COVER SURGICAL LIGHT HANDLE (MISCELLANEOUS) ×3 IMPLANT
COVER WAND RF STERILE (DRAPES) IMPLANT
CUFF TOURN SGL QUICK 34 (TOURNIQUET CUFF) ×3
CUFF TRNQT CYL 34X4.125X (TOURNIQUET CUFF) ×1 IMPLANT
DERMABOND ADVANCED (GAUZE/BANDAGES/DRESSINGS) ×2
DERMABOND ADVANCED .7 DNX12 (GAUZE/BANDAGES/DRESSINGS) ×1 IMPLANT
DRAPE SHEET LG 3/4 BI-LAMINATE (DRAPES) ×9 IMPLANT
DRSG AQUACEL AG ADV 3.5X 6 (GAUZE/BANDAGES/DRESSINGS) ×2 IMPLANT
DRSG AQUACEL AG ADV 3.5X10 (GAUZE/BANDAGES/DRESSINGS) ×3 IMPLANT
ELECT REM PT RETURN 15FT ADLT (MISCELLANEOUS) ×3 IMPLANT
EVACUATOR 1/8 PVC DRAIN (DRAIN) IMPLANT
GAUZE SPONGE 2X2 8PLY STRL LF (GAUZE/BANDAGES/DRESSINGS) IMPLANT
GAUZE SPONGE 4X4 12PLY STRL (GAUZE/BANDAGES/DRESSINGS) ×3 IMPLANT
GLOVE BIO SURGEON STRL SZ8.5 (GLOVE) ×6 IMPLANT
GLOVE BIOGEL PI IND STRL 8.5 (GLOVE) ×1 IMPLANT
GLOVE BIOGEL PI INDICATOR 8.5 (GLOVE) ×2
GOWN SPEC L3 XXLG W/TWL (GOWN DISPOSABLE) ×3 IMPLANT
GOWN STRL REUS W/TWL LRG LVL3 (GOWN DISPOSABLE) ×3 IMPLANT
HANDPIECE INTERPULSE COAX TIP (DISPOSABLE) ×3
JET LAVAGE IRRISEPT WOUND (IRRIGATION / IRRIGATOR) ×3
KIT BASIN OR (CUSTOM PROCEDURE TRAY) ×3 IMPLANT
KIT TURNOVER KIT A (KITS) IMPLANT
LAVAGE JET IRRISEPT WOUND (IRRIGATION / IRRIGATOR) IMPLANT
MANIFOLD NEPTUNE II (INSTRUMENTS) ×3 IMPLANT
PACK TOTAL JOINT (CUSTOM PROCEDURE TRAY) ×3 IMPLANT
PADDING CAST COTTON 6X4 STRL (CAST SUPPLIES) ×3 IMPLANT
PENCIL SMOKE EVACUATOR (MISCELLANEOUS) IMPLANT
PROTECTOR NERVE ULNAR (MISCELLANEOUS) ×3 IMPLANT
SET HNDPC FAN SPRY TIP SCT (DISPOSABLE) ×1 IMPLANT
SPONGE GAUZE 2X2 STER 10/PKG (GAUZE/BANDAGES/DRESSINGS) ×2
STAPLER VISISTAT 35W (STAPLE) IMPLANT
SUT ETHILON 2 0 PSLX (SUTURE) IMPLANT
SUT MNCRL AB 4-0 PS2 18 (SUTURE) ×3 IMPLANT
SUT VIC AB 1 CT1 36 (SUTURE) ×6 IMPLANT
SUT VIC AB 2-0 CT1 27 (SUTURE) ×9
SUT VIC AB 2-0 CT1 TAPERPNT 27 (SUTURE) ×3 IMPLANT
SWAB COLLECTION DEVICE MRSA (MISCELLANEOUS) ×3 IMPLANT
SWAB CULTURE ESWAB REG 1ML (MISCELLANEOUS) ×3 IMPLANT
TOWEL OR 17X26 10 PK STRL BLUE (TOWEL DISPOSABLE) ×6 IMPLANT

## 2019-06-11 NOTE — Progress Notes (Addendum)
PHARMACY - PHYSICIAN COMMUNICATION CRITICAL VALUE ALERT - BLOOD CULTURE IDENTIFICATION (BCID)  Michelle Doyle is an 29 y.o. female who presented to [facility] on 06/10/2019 with a chief complaint of withdrawal, N/V, fever  Assessment: MSSA Bacteremia 2nd IVDU  Name of physician (or Provider) Contacted: Noe Gens, NP  Current antibiotics: Vancomycin & cefepime  Changes to prescribed antibiotics recommended:  Recommendations accepted by provider- change to cefazolin 2 gm IV q8h  Results for orders placed or performed during the hospital encounter of 06/10/19  Blood Culture ID Panel (Reflexed) (Collected: 06/11/2019 12:10 AM)  Result Value Ref Range   Enterococcus species NOT DETECTED NOT DETECTED   Listeria monocytogenes NOT DETECTED NOT DETECTED   Staphylococcus species DETECTED (A) NOT DETECTED   Staphylococcus aureus (BCID) DETECTED (A) NOT DETECTED   Methicillin resistance NOT DETECTED NOT DETECTED   Streptococcus species NOT DETECTED NOT DETECTED   Streptococcus agalactiae NOT DETECTED NOT DETECTED   Streptococcus pneumoniae NOT DETECTED NOT DETECTED   Streptococcus pyogenes NOT DETECTED NOT DETECTED   Acinetobacter baumannii NOT DETECTED NOT DETECTED   Enterobacteriaceae species NOT DETECTED NOT DETECTED   Enterobacter cloacae complex NOT DETECTED NOT DETECTED   Escherichia coli NOT DETECTED NOT DETECTED   Klebsiella oxytoca NOT DETECTED NOT DETECTED   Klebsiella pneumoniae NOT DETECTED NOT DETECTED   Proteus species NOT DETECTED NOT DETECTED   Serratia marcescens NOT DETECTED NOT DETECTED   Haemophilus influenzae NOT DETECTED NOT DETECTED   Neisseria meningitidis NOT DETECTED NOT DETECTED   Pseudomonas aeruginosa NOT DETECTED NOT DETECTED   Candida albicans NOT DETECTED NOT DETECTED   Candida glabrata NOT DETECTED NOT DETECTED   Candida krusei NOT DETECTED NOT DETECTED   Candida parapsilosis NOT DETECTED NOT DETECTED   Candida tropicalis NOT DETECTED NOT DETECTED    Eudelia Bunch, Pharm.D [phone] 06/11/2019 3:24 PM

## 2019-06-11 NOTE — Consult Note (Addendum)
ORTHOPAEDIC CONSULTATION  REQUESTING PHYSICIAN: Juanito Doom, MD  PCP:  Patient, No Pcp Per  Chief Complaint: Left knee pain and swelling  HPI: Michelle Doyle is a 29 y.o. female with a history of IV drug abuse who stopped approximately 3 to 4 days ago.  She has had severe withdrawal symptoms.  For approximately 2 days, she is also had severe left knee pain.  She states that the knee pain has limited her ability to weight-bear.  She came to the emergency department.  She was found to be hypotensive and has responded to IV fluids.  CT of chest, abdomen, and pelvis was obtained showing bilateral septic pulmonary emboli and splenic emboli.  She was started on Vanco and cefepime.  Pulmonary critical care has evaluated the patient and plans for admission.  Also found to have UA positive for UTI.  Echocardiogram is pending.  Blood cultures were obtained, and Gram stain is showing GPC's.  Orthopedic consultation was placed for evaluation of left knee pain.  Patient states that she normally injects her lower legs.  She also complains of right foot pain.  Past Medical History:  Diagnosis Date   Hepatitis C    Opiate abuse, continuous ([place])    History reviewed. No pertinent surgical history. Social History   Socioeconomic History   Marital status: Single    Spouse name: Not on file   Number of children: Not on file   Years of education: Not on file   Highest education level: Not on file  Occupational History   Not on file  Tobacco Use   Smoking status: Current Every Day Smoker   Smokeless tobacco: Never Used  Substance and Sexual Activity   Alcohol use: Not Currently   Drug use: Yes   Sexual activity: Not on file  Other Topics Concern   Not on file  Social History Narrative   Not on file   Social Determinants of Health   Financial Resource Strain:    Difficulty of Paying Living Expenses:   Food Insecurity:    Worried About [place] in the Last Year:    Youth worker in the Last Year:   Transportation Needs:    Film/video editor (Medical):    Lack of Transportation (Non-Medical):   Physical Activity:    Days of Exercise per Week:    Minutes of Exercise per Session:   Stress:    Feeling of Stress :   Social Connections:    Frequency of Communication with Friends and Family:    Frequency of Social Gatherings with Friends and Family:    Attends Religious Services:    Active Member of Clubs or Organizations:    Attends Archivist Meetings:    Marital Status:    History reviewed. No pertinent family history. No Known Allergies Prior to Admission medications   Not on File   CT Chest W Contrast  Result Date: 06/11/2019 CLINICAL DATA:  Nausea and vomiting.  Sepsis.  IV drug abuse EXAM: CT CHEST, ABDOMEN, AND PELVIS WITH CONTRAST TECHNIQUE: Multidetector CT imaging of the chest, abdomen and pelvis was performed following the standard protocol during bolus administration of intravenous contrast. CONTRAST:  71mL OMNIPAQUE IOHEXOL 300 MG/ML  SOLN COMPARISON:  None. FINDINGS: CT CHEST FINDINGS Cardiovascular: Normal heart size. No pericardial effusion. No acute vascular finding. Mediastinum/Nodes: Mild generalized lymph node enlargement considered reactive. Lungs/Pleura: Numerous and generalized nodular ground-glass and consolidative opacities with variable cavitation. Largest cavities  are measured at the right apex (2.4 cm) and lateral right base (2.6 cm). Borderline interlobular septal thickening/edema. No effusion or pneumothorax. Musculoskeletal: No acute finding CT ABDOMEN PELVIS FINDINGS Hepatobiliary: No focal liver abnormality.No evidence of biliary obstruction or stone. Pancreas: Unremarkable. Spleen: Generous size with 2 hypoenhancing wedges within the subcapsular upper and lower spleen. Adrenals/Urinary Tract: Negative adrenals. No hydronephrosis or stone. Small volume gas in the bladder, possibly from sampling. No bladder wall  thickening. Stomach/Bowel: Liquid small and large bowel contents without bowel wall thickening or appendicitis. No appendicitis. Vascular/Lymphatic: No acute vascular abnormality. No mass or adenopathy. Reproductive:No pathologic findings. Other: No ascites or pneumoperitoneum. Musculoskeletal: No acute abnormalities. IMPRESSION: 1. Pattern of septic pulmonary emboli. 2. Two splenic infarcts. Electronically Signed   By: Monte Fantasia M.D.   On: 06/11/2019 04:09   CT ABDOMEN PELVIS W CONTRAST  Result Date: 06/11/2019 CLINICAL DATA:  Nausea and vomiting.  Sepsis.  IV drug abuse EXAM: CT CHEST, ABDOMEN, AND PELVIS WITH CONTRAST TECHNIQUE: Multidetector CT imaging of the chest, abdomen and pelvis was performed following the standard protocol during bolus administration of intravenous contrast. CONTRAST:  57mL OMNIPAQUE IOHEXOL 300 MG/ML  SOLN COMPARISON:  None. FINDINGS: CT CHEST FINDINGS Cardiovascular: Normal heart size. No pericardial effusion. No acute vascular finding. Mediastinum/Nodes: Mild generalized lymph node enlargement considered reactive. Lungs/Pleura: Numerous and generalized nodular ground-glass and consolidative opacities with variable cavitation. Largest cavities are measured at the right apex (2.4 cm) and lateral right base (2.6 cm). Borderline interlobular septal thickening/edema. No effusion or pneumothorax. Musculoskeletal: No acute finding CT ABDOMEN PELVIS FINDINGS Hepatobiliary: No focal liver abnormality.No evidence of biliary obstruction or stone. Pancreas: Unremarkable. Spleen: Generous size with 2 hypoenhancing wedges within the subcapsular upper and lower spleen. Adrenals/Urinary Tract: Negative adrenals. No hydronephrosis or stone. Small volume gas in the bladder, possibly from sampling. No bladder wall thickening. Stomach/Bowel: Liquid small and large bowel contents without bowel wall thickening or appendicitis. No appendicitis. Vascular/Lymphatic: No acute vascular abnormality. No  mass or adenopathy. Reproductive:No pathologic findings. Other: No ascites or pneumoperitoneum. Musculoskeletal: No acute abnormalities. IMPRESSION: 1. Pattern of septic pulmonary emboli. 2. Two splenic infarcts. Electronically Signed   By: Monte Fantasia M.D.   On: 06/11/2019 04:09   CT L-SPINE NO CHARGE  Result Date: 06/11/2019 CLINICAL DATA:  Initial evaluation for diffuse pain all over, opiate withdrawal. EXAM: CT LUMBAR SPINE WITHOUT CONTRAST TECHNIQUE: Multidetector CT imaging of the lumbar spine was performed without intravenous contrast administration. Multiplanar CT image reconstructions were also generated. COMPARISON:  None available. FINDINGS: Segmentation: Standard. Lowest well-formed disc space labeled the L5-S1 level. Alignment: Physiologic with preservation of the normal lumbar lordosis. No listhesis. Vertebrae: Vertebral body height maintained without evidence for acute or chronic fracture. No discrete or worrisome osseous lesions. No findings to suggest osteomyelitis discitis or septic arthritis. SI joints approximated symmetric. Paraspinal and other soft tissues: Paraspinous soft tissues demonstrate no acute finding. Multifocal nodular and parenchymal opacity seen within the partially visualized lungs, few of which demonstrate cavitary change, suggesting septic emboli. Remainder the visualized visceral structures otherwise unremarkable. Disc levels: No significant disc pathology seen within the lumbar spine. No appreciable epidural abscess or other collection. Small focal calcification noted at the distal aspect of the thecal sac, of doubtful significance. No significant canal or foraminal stenosis. IMPRESSION: 1. No acute abnormality within the lumbar spine. No findings to suggest osteomyelitis discitis or septic arthritis. 2. Multifocal nodular and parenchymal opacity within the partially visualized lungs, few of which demonstrate cavitary  change, suggesting septic emboli. Electronically  Signed   By: Jeannine Boga M.D.   On: 06/11/2019 03:59   DG Chest Portable 1 View  Result Date: 06/11/2019 CLINICAL DATA:  Fever and IV drug use. EXAM: PORTABLE CHEST 1 VIEW COMPARISON:  None. FINDINGS: There is scattered bilateral pulmonary opacities. Some of these opacities appear to be cavitary. There is no pneumothorax. There may be a small right-sided pleural effusion. No acute osseous abnormality. Heart size is normal. IMPRESSION: Diffuse bilateral pulmonary opacities, some of which may be cavitary, suggestive of diffuse septic emboli or multifocal pneumonia. Electronically Signed   By: Constance Holster M.D.   On: 06/11/2019 00:46    Positive ROS: All other systems have been reviewed and were otherwise negative with the exception of those mentioned in the HPI and as above.  Physical Exam: General: Alert, no acute distress Cardiovascular: No pedal edema Respiratory: No cyanosis, no use of accessory musculature GI: No organomegaly, abdomen is soft and non-tender Skin: No lesions in the area of chief complaint Neurologic: Sensation intact distally Psychiatric: Patient is competent for consent with normal mood and affect Lymphatic: No axillary or cervical lymphadenopathy  MUSCULOSKELETAL:   RLE: Examination of the right lower extremity reveals healing wounds over the medial aspect of the lower leg.  She states that she had abscesses in this area for previous injecting.  She has swelling and erythema to the dorsum of the foot over the third MTP joint.  The ankle and knee are nontender without swelling or effusion.  She has painless range of motion of the knee and ankle.  Logrolling of the hip does not create pain.  Her foot is perfused.  LLE: Examination of the left knee reveals no skin wounds or lesions.  She does have swelling and synovial bogginess.  There is a small effusion present.  She has pain with range of motion of the knee.  I can get her to actively flex to about 90, but  this is with significant pain.  She has painless logrolling of the hip.  She has healing wound over the medial aspect of the lower leg, which again she states is a healing abscess from previous injection.  The ankle is nonswollen and nontender.  Foot is well-perfused.  Foot does not have any erythema or swelling.  Assessment: Probable septic arthritis left knee. Right foot cellulitis.  History of IV drug abuse. Untreated hepatitis C. GPC bacteremia, culture pending. Bilateral septic pulmonary emboli. Splenic infarct.  Plan: I discussed the findings with the patient.  Given history of IV drug abuse and left knee effusion, I recommend aspiration of the left knee to rule out septic arthritis.  After verbal consent was obtained, the skin was prepped with ChloraPrep solution.  5 cc of 1% plain lidocaine were used to anesthetize the skin and subcutaneous tissue.  Using aseptic technique, I inserted an 18-gauge needle into the suprapatellar pouch of the left knee.  I withdrew about 5 cc of cloudy, straw-colored joint fluid.  This was sent to the lab for stat cell culture with differential, crystal ID, Gram stain, and culture.  Once we have lab confirmation of septic arthritis, then she will need operative irrigation and debridement of the left knee.  We did discuss the risks, benefits, and alternatives.  Patient will need an infectious disease consult as well.  If her foot x-rays show any bony abnormality, that she will also need an MRI of the right foot.    Hilton Cork Kazuo Durnil,  MD [phone]    06/11/2019 2:25 PM     ADDENDUM: Lab called with synovial fluid cell count: 21K WBCs 92% N (-) crystals Gram stain (-)  Plan for open I&D L knee today  The risks, benefits, and alternatives were discussed with the patient. There are risks associated with the surgery including, but not limited to, problems with anesthesia (death), recurrent infection, hematoma (blood accumulation) which may require  surgical drainage, blood clots, pulmonary embolism, nerve injury (foot drop), and blood vessel injury. The patient understands these risks and elects to proceed.

## 2019-06-11 NOTE — Anesthesia Procedure Notes (Signed)
Anesthesia Regional Block: Adductor canal block   Pre-Anesthetic Checklist: ,, timeout performed, Correct Patient, Correct Site, Correct Laterality, Correct Procedure, Correct Position, site marked, Risks and benefits discussed, pre-op evaluation,  At surgeon's request and post-op pain management  Laterality: Left  Prep: Maximum Sterile Barrier Precautions used, chloraprep       Needles:  Injection technique: Single-shot  Needle Type: Echogenic Stimulator Needle     Needle Length: 9cm  Needle Gauge: 21     Additional Needles:   Procedures:,,,, ultrasound used (permanent image in chart),,,,  Narrative:  Start time: 06/11/2019 5:34 PM End time: 06/11/2019 5:44 PM Injection made incrementally with aspirations every 5 mL.  Performed by: Personally  Anesthesiologist: Roderic Palau, MD  Additional Notes: 2% Lidocaine skin wheel.

## 2019-06-11 NOTE — Anesthesia Procedure Notes (Signed)
Procedure Name: LMA Insertion Date/Time: 06/11/2019 6:07 PM Performed by: Deliah Boston, CRNA Pre-anesthesia Checklist: Patient identified, Emergency Drugs available, Suction available and Patient being monitored Patient Re-evaluated:Patient Re-evaluated prior to induction Oxygen Delivery Method: Circle system utilized Preoxygenation: Pre-oxygenation with 100% oxygen Induction Type: IV induction Ventilation: Mask ventilation without difficulty LMA: LMA inserted LMA Size: 4.0 Number of attempts: 1 Placement Confirmation: positive ETCO2 and breath sounds checked- equal and bilateral Tube secured with: Tape Dental Injury: Teeth and Oropharynx as per pre-operative assessment

## 2019-06-11 NOTE — Progress Notes (Addendum)
Pharmacy Antibiotic Note  Michelle Doyle is a 29 y.o. female admitted on 06/10/2019 with suspected endocarditis.  Pharmacy has been consulted for Vancomycin, cefepime dosing.  Plan: Vancomycin 1gm iv x1, then Vancomycin 750 mg IV Q 12 hrs. Goal AUC 400-550. Expected AUC: 568 SCr used: 0.8 (adjusted)  Cefepime 2gm iv x1, then 2gm iv q8hr   Height: 5\' 7"  (170.2 cm) Weight: 49.9 kg (110 lb) IBW/kg (Calculated) : 61.6  Temp (24hrs), Avg:100.1 F (37.8 C), Min:98.4 F (36.9 C), Max:102 F (38.9 C)  Recent Labs  Lab 06/11/19 0005 06/11/19 0232  WBC 24.6*  --   CREATININE 0.76  --   LATICACIDVEN 2.6* 1.9    Estimated Creatinine Clearance: 83.2 mL/min (by C-G formula based on SCr of 0.76 mg/dL).    No Known Allergies  Antimicrobials this admission: Vancomycin 06/11/2019 >> Cefepime 06/11/2019 >>   Dose adjustments this admission: -  Microbiology results: -  Thank you for allowing pharmacy to be a part of this patient's care.  Nani Skillern Crowford 06/11/2019 6:43 AM

## 2019-06-11 NOTE — ED Notes (Signed)
Patient indicated she was suicidal without a plan during triage assessment, the provider was notified of the same

## 2019-06-11 NOTE — H&P (Signed)
NAME:  Michelle Doyle, MRN:  [id number], DOB:  04/28/1991, LOS: 0 ADMISSION DATE:  06/10/2019, CONSULTATION DATE: 06/11/2019 REFERRING MD:  Dr. Kathrynn Humble, CHIEF COMPLAINT: Heroin withdrawal  Brief History   29 year old female with with history of IV heroin use and untreated hepatitis C presents in acute heroin withdrawal.  Reported last use of heroin 3 days prior to admission.  History of present illness   Michelle Doyle is a 29 year old female with a history of IV heroin use and untreated hepatitis C who presented to the emergency department with complaints nausea and vomiting secondary to acute opioid withdrawal. Patient reports inability to consume any oral intake since nausea began.  Patient reports urine is very dark.  Additionally patient reports development of chest pain, shortness of breath, back pain, dysuria, and right flank pain.    Patient states she is concerned that symptoms are related to opioid withdrawal, she reports last use of heroin 3 to 4 days prior to admission.  She denies any other illicit drug use.  Patient also endorsed suicidal ideations during ED stay.  On admission patient was found febrile, tachypneic, tachycardic, and hypotensive.  Lab work significant for hyponatremia, hypokalemia, elevated liver enzymes, elevated high sensitive troponin, lactic acidosis, significant leukocytosis, and anemia.  CT chest/abdomen/pelvis consistent with septic pulmonary emboli and 2 splenic infarctions.  She has had some transient hypotension here in the ER but responding to fluids.    PCCM consulted for further management admission  Past Medical History  Opioid abuse Hepatitis C  Significant Hospital Events   Admitted 06/11/2019  Consults:    Procedures:    Significant Diagnostic Tests:  CT chest/abdomen/pelvis 4/8 > consistent with septic pulmonary emboli and 2 splenic infarctions  Micro Data:  Covid 4/8 > Blood cultures 4/8 > Urine culture 4/8 >  Antimicrobials:  Vancomycin  4/8 > Cefepime 4/8 >   Interim history/subjective:  na  Objective   Blood pressure 94/62, pulse 93, temperature (!) 102 F (38.9 C), temperature source Rectal, resp. rate (!) 34, height 5\' 7"  (1.702 m), weight 49.9 kg, last menstrual period 05/27/2019, SpO2 100 %.        Intake/Output Summary (Last 24 hours) at 06/11/2019 0509 Last data filed at 06/11/2019 0239 Gross per 24 hour  Intake 2300 ml  Output --  Net 2300 ml   Filed Weights   06/10/19 2344  Weight: 49.9 kg    Examination: General: WF in moderate distress HENT: WNL Lungs: clear Cardiovascular: reg Abdomen: benign Extremities: wnl Neuro: non focal GU: wnl  Resolved Hospital Problem list     Assessment & Plan:  Septic shock -Criteria: On admission patient was seen febrile, tachypneic, tachycardic, leukocytotic, with suspected infection : -Given history of IV heroin abuse concern for bacteremia/endocarditis.  CT chest consistent with septic pulmonary emboli P: Admit ICU to monitor bp, initiate endocarditis evaluation Supplemental oxygen Pan cultures prior to antibiotic IV vancomycin and cefepime Aggressive IV hydration MAP< 65  Obtain / Trend lactic acid Procalcitonin Monitor urine output Capillary refill: Obtain echo, will likely need TEE as well  Opioid abuse -Patient reports history of IV heroin abuse, last use 3 days prior to admission P: Opioid withdrawal scale Made need to add Suboxone Antiemetics and antidiarrheals as needed  Hyponatremia Hypokalemia P: IV hydration Supplement as needed Trend BMP  Elevated troponin -Concern for endocarditis P: Obtain echo IV antibiotics as above Trend troponin  Best practice:  Diet: N.p.o. Pain/Anxiety/Delirium protocol (if indicated): As needed VAP protocol (if indicated): Not applicable  DVT prophylaxis: Subcu heparin GI prophylaxis: PPI Glucose control: Monitor Mobility: Bedrest Code Status: Full Family Communication: We will update upon  admission Disposition: ICU  Labs   CBC: Recent Labs  Lab 06/11/19 0005  WBC 24.6*  NEUTROABS 21.1*  HGB 9.4*  HCT 27.7*  MCV 78.2*  PLT 135*    Basic Metabolic Panel: Recent Labs  Lab 06/11/19 0005  NA 127*  K 3.2*  CL 89*  CO2 25  GLUCOSE 109*  BUN 27*  CREATININE 0.76  CALCIUM 7.8*   GFR: Estimated Creatinine Clearance: 83.9 mL/min (by C-G formula based on SCr of 0.76 mg/dL). Recent Labs  Lab 06/11/19 0005 06/11/19 0232  WBC 24.6*  --   LATICACIDVEN 2.6* 1.9    Liver Function Tests: Recent Labs  Lab 06/11/19 0005  AST 162*  ALT 47*  ALKPHOS 139*  BILITOT 1.3*  PROT 7.6  ALBUMIN 2.0*   No results for input(s): LIPASE, AMYLASE in the last 168 hours. No results for input(s): AMMONIA in the last 168 hours.  ABG No results found for: PHART, PCO2ART, PO2ART, HCO3, TCO2, ACIDBASEDEF, O2SAT   Coagulation Profile: Recent Labs  Lab 06/11/19 0005  INR 1.2    Cardiac Enzymes: Recent Labs  Lab 06/11/19 0232  CKTOTAL 146    HbA1C: No results found for: HGBA1C  CBG: No results for input(s): GLUCAP in the last 168 hours.  Review of Systems:   NV diarrhea general malaise otherwise no positive  Past Medical History  She,  has a past medical history of Hepatitis C and Opiate abuse, continuous ([place]).   Surgical History   History reviewed. No pertinent surgical history.   Social History   reports that she has been smoking. She does not have any smokeless tobacco history on file. She reports previous alcohol use. She reports current drug use.   Family History   Her family history is not on file.   Allergies No Known Allergies   Home Medications  Prior to Admission medications   Not on File     Critical care time: 35 minutes was spent in chart review, bed side evaluation and critical care planning

## 2019-06-11 NOTE — Progress Notes (Addendum)
      INFECTIOUS DISEASE ATTENDING ADDENDUM:   Date: 06/11/2019  Patient name: Michelle Doyle  Medical record number: [id number]  Date of birth: Oct 20, 1990   Patient with IVDU, likely septic knee and found to have MSSA bacteremia  I will narrow her to ancef  TTE and will need TEE  If knee is infected would strongly recommend OPEN Arthrotomy given this is Staph aureus  I will see the patient formally tomorrow am    Alcide Evener 06/11/2019, 3:05 PM

## 2019-06-11 NOTE — ED Provider Notes (Signed)
Michelle Doyle DEPT Provider Note   CSN: [id number] Arrival date & time: 06/10/19  2329     History Chief Complaint  Patient presents with  . Addiction Problem  . Suicidal  . Fever    Michelle Doyle is a 29 y.o. female with a history of IV heroin use and untreated hepatitis C who presents to the emergency department by EMS from a hotel with a chief complaint of generally feeling unwell for the last 3 days accompanied by nausea and nonbloody, nonbilious vomiting.  She is feeling very dehydrated and has been unable to eat or drink since onset of symptoms.  She has been having muscle cramps in her legs.  She has voided twice today and urine has been very dark.  Over the last 3 days, she has developed chest pain accompanied by shortness of breath, and back pain.  She reports that she had an episode of diarrhea earlier today with significant urgency and that she was unable to make it to the bathroom in time and soiled herself.  She otherwise denies urinary or fecal incontinence.  She also endorses dysuria and right flank pain.  She is unable to characterize the pain, but states that it is constant, severe, and worsening.  She developed a generalized headache today.  No treatment prior to arrival.  Reports that she last used heroin 3 to 4 days ago.  She was concerned that her symptoms were related to withdrawal.  She denies any other illicit or recreational substance use that is known, but she is unsure if the heroin that she has been injecting has been laced with anything else.  No recent alcohol use.  No Tylenol use.  She is a current, every day tobacco user.  She also endorses suicidal ideation.  No HI or auditory or visual hallucinations.  She does not have a plan.  The history is provided by the patient, medical records and the EMS personnel. No language interpreter was used.       Past Medical History:  Diagnosis Date  . Hepatitis C   . Opiate abuse, continuous  [facility])     Patient Active Problem List   Diagnosis Date Noted  . Endocarditis 06/11/2019    History reviewed. No pertinent surgical history.   OB History   No obstetric history on file.     No family history on file.  Social History   Tobacco Use  . Smoking status: Current Every Day Smoker  Substance Use Topics  . Alcohol use: Not Currently  . Drug use: Yes    Home Medications Prior to Admission medications   Not on File    Allergies    Patient has no known allergies.  Review of Systems   Review of Systems  Constitutional: Positive for chills, fatigue and fever. Negative for activity change and diaphoresis.  HENT: Negative for congestion and sore throat.   Eyes: Negative for visual disturbance.  Respiratory: Positive for shortness of breath.   Cardiovascular: Positive for chest pain.  Gastrointestinal: Positive for diarrhea, nausea and vomiting. Negative for abdominal pain, anal bleeding, blood in stool and constipation.  Genitourinary: Positive for decreased urine volume, dysuria and flank pain. Negative for vaginal bleeding, vaginal discharge and vaginal pain.  Musculoskeletal: Positive for myalgias. Negative for back pain, gait problem, joint swelling, neck pain and neck stiffness.       Muscle cramps  Skin: Positive for wound. Negative for color change and rash.  Allergic/Immunologic: Negative for immunocompromised state.  Neurological: Positive for headaches. Negative for dizziness, seizures, syncope, weakness (generalized) and numbness.  Psychiatric/Behavioral: Negative for confusion.    Physical Exam Updated Vital Signs BP (!) 104/56   Pulse 96   Temp 98.4 F (36.9 C) (Oral)   Resp (!) 31   Ht '5\' 7"'  (1.702 m)   Wt 49.9 kg   LMP 05/27/2019 Comment: negativeHCG quantitative 06/11/19  SpO2 100%   BMI 17.23 kg/m   Physical Exam Vitals and nursing note reviewed. Exam conducted with a chaperone present.  Constitutional:      General: She is not in  acute distress.    Appearance: She is ill-appearing. She is not toxic-appearing or diaphoretic.  HENT:     Head: Normocephalic.  Eyes:     General: No scleral icterus.    Conjunctiva/sclera: Conjunctivae normal.  Cardiovascular:     Rate and Rhythm: Regular rhythm. Tachycardia present.     Heart sounds: Murmur present. No friction rub. No gallop.   Pulmonary:     Effort: No respiratory distress.     Breath sounds: No stridor. No wheezing, rhonchi or rales.     Comments: She is tachypneic.  There is accessory muscle use, but no retractions.  Lungs are clear to auscultation bilaterally. Chest:     Chest wall: No tenderness.  Abdominal:     General: There is no distension.     Palpations: Abdomen is soft. There is no mass.     Tenderness: There is no abdominal tenderness. There is no right CVA tenderness, left CVA tenderness, guarding or rebound.     Hernia: No hernia is present.     Comments: Mild suprapubic tenderness without rebound or guarding.  Hypoactive bowel sounds in all 4 quadrants. Abdomen is otherwise nontender. She has left CVA tenderness.  No right CVA tenderness.  Genitourinary:    Comments: Rectal tone is normal.  Musculoskeletal:     Cervical back: Neck supple.     Right lower leg: No edema.     Left lower leg: No edema.     Comments: Diffusely tender to palpation to the spinous processes of the thoracic and lumbar spine.  There is bilateral paraspinal muscle tenderness.  No crepitus or step-offs.  No overlying erythema, edema, or induration.  There is a scabbed over lesion noted to the right lower leg the second noted to the left lower leg.  No surrounding erythema, edema, or warmth.  No fluctuance.  No overlying redness or warmth to the joints of the upper or lower extremity.  No focal tenderness to palpation.  Range of motion is diffusely intact.  Skin:    General: Skin is warm.     Coloration: Skin is jaundiced.     Findings: No rash.     Comments: Mild  jaundice  Neurological:     Mental Status: She is alert.  Psychiatric:        Behavior: Behavior normal.     ED Results / Procedures / Treatments   Labs (all labs ordered are listed, but only abnormal results are displayed) Labs Reviewed  COMPREHENSIVE METABOLIC PANEL - Abnormal; Notable for the following components:      Result Value   Sodium 127 (*)    Potassium 3.2 (*)    Chloride 89 (*)    Glucose, Bld 109 (*)    BUN 27 (*)    Calcium 7.8 (*)    Albumin 2.0 (*)    AST 162 (*)    ALT 47 (*)  Alkaline Phosphatase 139 (*)    Total Bilirubin 1.3 (*)    All other components within normal limits  LACTIC ACID, PLASMA - Abnormal; Notable for the following components:   Lactic Acid, Venous 2.6 (*)    All other components within normal limits  CBC WITH DIFFERENTIAL/PLATELET - Abnormal; Notable for the following components:   WBC 24.6 (*)    RBC 3.54 (*)    Hemoglobin 9.4 (*)    HCT 27.7 (*)    MCV 78.2 (*)    Platelets 135 (*)    Neutro Abs 21.1 (*)    Monocytes Absolute 1.3 (*)    Abs Immature Granulocytes 0.92 (*)    All other components within normal limits  URINALYSIS, ROUTINE W REFLEX MICROSCOPIC - Abnormal; Notable for the following components:   Color, Urine AMBER (*)    APPearance CLOUDY (*)    Hgb urine dipstick MODERATE (*)    Protein, ur 30 (*)    Leukocytes,Ua LARGE (*)    WBC, UA >50 (*)    Bacteria, UA MANY (*)    All other components within normal limits  RAPID URINE DRUG SCREEN, HOSP PERFORMED - Abnormal; Notable for the following components:   Amphetamines POSITIVE (*)    All other components within normal limits  TROPONIN I (HIGH SENSITIVITY) - Abnormal; Notable for the following components:   Troponin I (High Sensitivity) 92 (*)    All other components within normal limits  TROPONIN I (HIGH SENSITIVITY) - Abnormal; Notable for the following components:   Troponin I (High Sensitivity) 108 (*)    All other components within normal limits  SARS  CORONAVIRUS 2 (TAT 6-24 HRS)  RESPIRATORY PANEL BY RT PCR (FLU A&B, COVID)  CULTURE, BLOOD (ROUTINE X 2)  CULTURE, BLOOD (ROUTINE X 2)  URINE CULTURE  URINE CULTURE  LACTIC ACID, PLASMA  PROTIME-INR  APTT  CK  HIV ANTIBODY (ROUTINE TESTING W REFLEX)  I-STAT BETA HCG BLOOD, ED ([facility], [facility], [facility] ONLY)    EKG EKG Interpretation  Date/Time:  Wednesday June 10 2019 23:41:34 EDT Ventricular Rate:  154 PR Interval:    QRS Duration: 86 QT Interval:  268 QTC Calculation: 429 R Axis:   127 Text Interpretation: Sinus tachycardia Right axis deviation Borderline Q waves in inferior leads Borderline T wave abnormalities No old tracing to compare Nonspecific ST and T wave abnormality Confirmed by Varney Biles [phone]) on 06/11/2019 12:59:43 AM   Radiology CT Chest W Contrast  Result Date: 06/11/2019 CLINICAL DATA:  Nausea and vomiting.  Sepsis.  IV drug abuse EXAM: CT CHEST, ABDOMEN, AND PELVIS WITH CONTRAST TECHNIQUE: Multidetector CT imaging of the chest, abdomen and pelvis was performed following the standard protocol during bolus administration of intravenous contrast. CONTRAST:  91m OMNIPAQUE IOHEXOL 300 MG/ML  SOLN COMPARISON:  None. FINDINGS: CT CHEST FINDINGS Cardiovascular: Normal heart size. No pericardial effusion. No acute vascular finding. Mediastinum/Nodes: Mild generalized lymph node enlargement considered reactive. Lungs/Pleura: Numerous and generalized nodular ground-glass and consolidative opacities with variable cavitation. Largest cavities are measured at the right apex (2.4 cm) and lateral right base (2.6 cm). Borderline interlobular septal thickening/edema. No effusion or pneumothorax. Musculoskeletal: No acute finding CT ABDOMEN PELVIS FINDINGS Hepatobiliary: No focal liver abnormality.No evidence of biliary obstruction or stone. Pancreas: Unremarkable. Spleen: Generous size with 2 hypoenhancing wedges within the subcapsular upper and lower spleen. Adrenals/Urinary Tract: Negative  adrenals. No hydronephrosis or stone. Small volume gas in the bladder, possibly from sampling. No bladder wall thickening. Stomach/Bowel: Liquid  small and large bowel contents without bowel wall thickening or appendicitis. No appendicitis. Vascular/Lymphatic: No acute vascular abnormality. No mass or adenopathy. Reproductive:No pathologic findings. Other: No ascites or pneumoperitoneum. Musculoskeletal: No acute abnormalities. IMPRESSION: 1. Pattern of septic pulmonary emboli. 2. Two splenic infarcts. Electronically Signed   By: Monte Fantasia M.D.   On: 06/11/2019 04:09   CT ABDOMEN PELVIS W CONTRAST  Result Date: 06/11/2019 CLINICAL DATA:  Nausea and vomiting.  Sepsis.  IV drug abuse EXAM: CT CHEST, ABDOMEN, AND PELVIS WITH CONTRAST TECHNIQUE: Multidetector CT imaging of the chest, abdomen and pelvis was performed following the standard protocol during bolus administration of intravenous contrast. CONTRAST:  16m OMNIPAQUE IOHEXOL 300 MG/ML  SOLN COMPARISON:  None. FINDINGS: CT CHEST FINDINGS Cardiovascular: Normal heart size. No pericardial effusion. No acute vascular finding. Mediastinum/Nodes: Mild generalized lymph node enlargement considered reactive. Lungs/Pleura: Numerous and generalized nodular ground-glass and consolidative opacities with variable cavitation. Largest cavities are measured at the right apex (2.4 cm) and lateral right base (2.6 cm). Borderline interlobular septal thickening/edema. No effusion or pneumothorax. Musculoskeletal: No acute finding CT ABDOMEN PELVIS FINDINGS Hepatobiliary: No focal liver abnormality.No evidence of biliary obstruction or stone. Pancreas: Unremarkable. Spleen: Generous size with 2 hypoenhancing wedges within the subcapsular upper and lower spleen. Adrenals/Urinary Tract: Negative adrenals. No hydronephrosis or stone. Small volume gas in the bladder, possibly from sampling. No bladder wall thickening. Stomach/Bowel: Liquid small and large bowel contents without  bowel wall thickening or appendicitis. No appendicitis. Vascular/Lymphatic: No acute vascular abnormality. No mass or adenopathy. Reproductive:No pathologic findings. Other: No ascites or pneumoperitoneum. Musculoskeletal: No acute abnormalities. IMPRESSION: 1. Pattern of septic pulmonary emboli. 2. Two splenic infarcts. Electronically Signed   By: JMonte FantasiaM.D.   On: 06/11/2019 04:09   CT L-SPINE NO CHARGE  Result Date: 06/11/2019 CLINICAL DATA:  Initial evaluation for diffuse pain all over, opiate withdrawal. EXAM: CT LUMBAR SPINE WITHOUT CONTRAST TECHNIQUE: Multidetector CT imaging of the lumbar spine was performed without intravenous contrast administration. Multiplanar CT image reconstructions were also generated. COMPARISON:  None available. FINDINGS: Segmentation: Standard. Lowest well-formed disc space labeled the L5-S1 level. Alignment: Physiologic with preservation of the normal lumbar lordosis. No listhesis. Vertebrae: Vertebral body height maintained without evidence for acute or chronic fracture. No discrete or worrisome osseous lesions. No findings to suggest osteomyelitis discitis or septic arthritis. SI joints approximated symmetric. Paraspinal and other soft tissues: Paraspinous soft tissues demonstrate no acute finding. Multifocal nodular and parenchymal opacity seen within the partially visualized lungs, few of which demonstrate cavitary change, suggesting septic emboli. Remainder the visualized visceral structures otherwise unremarkable. Disc levels: No significant disc pathology seen within the lumbar spine. No appreciable epidural abscess or other collection. Small focal calcification noted at the distal aspect of the thecal sac, of doubtful significance. No significant canal or foraminal stenosis. IMPRESSION: 1. No acute abnormality within the lumbar spine. No findings to suggest osteomyelitis discitis or septic arthritis. 2. Multifocal nodular and parenchymal opacity within the  partially visualized lungs, few of which demonstrate cavitary change, suggesting septic emboli. Electronically Signed   By: BJeannine BogaM.D.   On: 06/11/2019 03:59   DG Chest Portable 1 View  Result Date: 06/11/2019 CLINICAL DATA:  Fever and IV drug use. EXAM: PORTABLE CHEST 1 VIEW COMPARISON:  None. FINDINGS: There is scattered bilateral pulmonary opacities. Some of these opacities appear to be cavitary. There is no pneumothorax. There may be a small right-sided pleural effusion. No acute osseous abnormality. Heart size is normal.  IMPRESSION: Diffuse bilateral pulmonary opacities, some of which may be cavitary, suggestive of diffuse septic emboli or multifocal pneumonia. Electronically Signed   By: Constance Holster M.D.   On: 06/11/2019 00:46    Procedures .Critical Care Performed by: Joanne Gavel, PA-C Authorized by: Joanne Gavel, PA-C   Critical care provider statement:    Critical care time (minutes):  55   Critical care time was exclusive of:  Separately billable procedures and treating other patients and teaching time   Critical care was necessary to treat or prevent imminent or life-threatening deterioration of the following conditions:  Sepsis and shock   Critical care was time spent personally by me on the following activities:  Ordering and performing treatments and interventions, ordering and review of laboratory studies, ordering and review of radiographic studies, pulse oximetry, re-evaluation of patient's condition, review of old charts, obtaining history from patient or surrogate, examination of patient, evaluation of patient's response to treatment, discussions with consultants and development of treatment plan with patient or surrogate   (including critical care time)  Medications Ordered in ED Medications  sodium chloride (PF) 0.9 % injection (has no administration in time range)  docusate sodium (COLACE) capsule 100 mg (has no administration in time range)    polyethylene glycol (MIRALAX / GLYCOLAX) packet 17 g (has no administration in time range)  enoxaparin (LOVENOX) injection 40 mg (has no administration in time range)  lactated ringers infusion ( Intravenous New Bag/Given 06/11/19 0631)  ondansetron (ZOFRAN) injection 4 mg (has no administration in time range)  famotidine (PEPCID) IVPB 20 mg premix (has no administration in time range)  ceFEPIme (MAXIPIME) 2 g in sodium chloride 0.9 % 100 mL IVPB (2 g Intravenous New Bag/Given 06/11/19 0633)  vancomycin (VANCOREADY) IVPB 750 mg/150 mL (has no administration in time range)  sodium chloride 0.9 % bolus 1,000 mL (0 mLs Intravenous Stopped 06/11/19 0120)  acetaminophen (TYLENOL) tablet 650 mg (650 mg Oral Given 06/11/19 0033)  ondansetron (ZOFRAN) injection 4 mg (4 mg Intravenous Given 06/11/19 0033)  vancomycin (VANCOCIN) IVPB 1000 mg/200 mL premix (0 mg Intravenous Stopped 06/11/19 0238)  ceFEPIme (MAXIPIME) 2 g in sodium chloride 0.9 % 100 mL IVPB (0 g Intravenous Stopped 06/11/19 0121)  sodium chloride 0.9 % bolus 1,000 mL (0 mLs Intravenous Stopped 06/11/19 0238)  lactated ringers bolus 1,000 mL (0 mLs Intravenous Stopped 06/11/19 0500)  iohexol (OMNIPAQUE) 300 MG/ML solution 100 mL (80 mLs Intravenous Contrast Given 06/11/19 0323)    ED Course  I have reviewed the triage vital signs and the nursing notes.  Pertinent labs & imaging results that were available during my care of the patient were reviewed by me and considered in my medical decision making (see chart for details).  Clinical Course as of Jun 10 725  Thu Jun 11, 2019  8416 Sepsis reevaluation performed. Patient remains hypotensive at 80/60s. MAP is 75.  Blood pressure has not been recycled several times and systolic pressure is now over 90.  We will continue to closely monitor. Spoke with Dr. Oletta Darter with critical care. Critical care will come to evaluate the patient for admission. He recommends phenylephrine for hypotension if the patient does not  have a central line.   [MM]    Clinical Course User Index [MM] Zvi Duplantis, Laymond Purser, PA-C   MDM Rules/Calculators/A&P                      29 year old female with a history of IV heroin  use and hepatitis C presenting with 3 days of generalized malaise, poor p.o. intake, nausea, vomiting, headache, chest pain, shortness of breath, right flank pain, dysuria, and suicidal ideation.  The patient has been seen and evaluated by Dr. Kathrynn Humble, attending physician.  On exam, she is ill-appearing.  Tachycardic in the 160s.  Febrile with blood pressure 90s/60s.  Code sepsis initiated.  Given her history of IV drug use, will empirically start the patient on cefepime and vancomycin.  On exam, she has no focal joint pain.  She is diffusely tender to palpation throughout her thoracic and lumbar back.  There is midline tenderness.  No focal neurologic deficits.  She also has significant right flank pain.  She is very tachycardic and has a systolic murmur.  She is endorsing chest pain and shortness of breath.  EKG was sinus tachycardia.  Chest x-ray reviewed by me with diffuse bilateral pulmonary opacities.  Several of the opacities appear to be cavitary.  There is concern for to be septic emboli versus multifocal pneumonia.  Troponin is elevated.  Doubt ACS.  This is likely elevated secondary to demand.  She has a marked leukocytosis of 24,000.  Initial lactate is elevated at 2.6.  Improved on repeat after receiving 3 L of IV fluids.  She has elevated transaminases and alk phos, likely secondary to untreated hepatitis C, but given her right flank pain this could also be secondary to a hepatic infarct versus cholecystitis as she does appear to have some jaundice on exam.  Will order CT chest, abdomen, and pelvis.  Will add on CT lumbar spine as MRI is not available at this time to assess for changes to the spine.  If neurosurgery needs to be involved, the patient will need to be transferred to [facility].  CT  demonstrating pattern of septic pulmonary emboli and 2 splenic infarcts.  No changes noted to the lumbar spine to suggest osteomyelitis, discitis, or septic arthritis.  Her back pain could be secondary to splenic infarcts.  She continues to have no neurologic deficits on exam.  On reevaluation, patient's blood pressure is 85/65 with a MAP of 75 despite 30 cc/kg fluid boluses.  Heart rate is significantly improved.  Considered starting norepinephrine, but after her blood pressure was cycled, systolic pressure increased to greater than 90.  We will continue to closely monitor.  She is critically ill with septic shock.  Will consult critical care.  Spoke with Dr. Oletta Darter.  Critical care will evaluate the patient in the ER.  COVID-19 test is pending.  The patient has been seen and evaluated by Dr. Mariane Masters with critical care.  Critical care will accept the patient for admission.  COVID-19 test is pending.  Patient has also been having passive suicidal ideation.  She will likely require evaluation by behavioral health.  The patient appears reasonably stabilized for admission considering the current resources, flow, and capabilities available in the ED at this time, and I doubt any other [facility] requiring further screening and/or treatment in the ED prior to admission.   Final Clinical Impression(s) / ED Diagnoses Final diagnoses:  Fever  Septic shock (HCC)  Septic embolism (HCC)  Acute infective endocarditis, due to unspecified organism    Rx / DC Orders ED Discharge Orders    None       Kaimana Neuzil A, PA-C 06/11/19 [place], Ankit, MD 06/16/19 [phone]

## 2019-06-11 NOTE — Progress Notes (Signed)
Pt just arrived to unit. Current diet is NPO, however, pt is requesting to have exception of ice chips or sips of water. Pt asking if she will be prescribed Methadone during her admission and if her L knee aspiration will be done later this evening or tomorrow. MD notified of patient questions.   Pt advised RN she last used amphetamines three days ago and is currently experiencing her withdrawal sxs. Pt reports feeling generalized pain 10/10 all over and feels hot, although oral temp 98.2 F. Pt appears anxious. Will continue to monitor.

## 2019-06-11 NOTE — ED Notes (Signed)
Date and time results received: 06/11/19 12:34 AM  Test: Lactic Acid Critical Value: 2.6  Name of Provider Notified: Kathrynn Humble, MD  Orders Received? Or Actions Taken?:

## 2019-06-11 NOTE — Progress Notes (Signed)
Echocardiogram 2D Echocardiogram has been performed.  [name] 06/11/2019, 1:43 PM

## 2019-06-11 NOTE — Progress Notes (Signed)
Patient arrived to unit at approximately 1540. Pt MEWs was yellow due to increased HR and RR prior to arriving to floor. Obtained full set of vitals on arrival, pt MEWs remains in yellow due to elevated HR and RR but patient appears stable and asxs. Charge RN consulted and agreed to implement yellow mews protocol and continue to monitor pt.   Pt left unit at approximately 1700 for L knee aspiration procedure scheduled in short stay.      Tyren Dugar D Esvin Hnat 06/11/2019,5:35 PM        Darin Redmann D Cyra Spader 06/11/2019,5:34 PM

## 2019-06-11 NOTE — Transfer of Care (Signed)
Immediate Anesthesia Transfer of Care Note  Patient: Michelle Doyle  Procedure(s) Performed: Procedure(s): IRRIGATION AND DEBRIDEMENT KNEE (Left)  Patient Location: PACU  Anesthesia Type:General  Level of Consciousness: Alert, Awake, Oriented  Airway & Oxygen Therapy: Patient Spontanous Breathing  Post-op Assessment: Report given to RN  Post vital signs: Reviewed and stable  Last Vitals:  Vitals:   06/11/19 1735 06/11/19 1746  BP: 97/64 (!) 95/55  Pulse:  88  Resp: 18 18  Temp:  36.7 C  SpO2:  123XX123    Complications: No apparent anesthesia complications

## 2019-06-11 NOTE — Anesthesia Postprocedure Evaluation (Incomplete Revision)
Anesthesia Post Note  Patient: Michelle Doyle  Procedure(s) Performed: IRRIGATION AND DEBRIDEMENT KNEE (Left Knee)     Patient location during evaluation: PACU Anesthesia Type: General Level of consciousness: awake and alert Pain management: pain level controlled Vital Signs Assessment: post-procedure vital signs reviewed and stable Respiratory status: spontaneous breathing, nonlabored ventilation and respiratory function stable Cardiovascular status: blood pressure returned to baseline and stable Postop Assessment: no apparent nausea or vomiting Anesthetic complications: no    Last Vitals:  Vitals:   06/11/19 1945 06/11/19 1948  BP: 100/64 101/65  Pulse: 85 83  Resp: (!) 33 (!) 22  Temp: 36.6 C   SpO2: 98% 96%    Last Pain:  Vitals:   06/11/19 1930  TempSrc:   PainSc: 0-No pain                 Swetha Rayle,W. EDMOND

## 2019-06-11 NOTE — Anesthesia Postprocedure Evaluation (Addendum)
Anesthesia Post Note  Patient: Michelle Doyle  Procedure(s) Performed: IRRIGATION AND DEBRIDEMENT KNEE (Left Knee)     Patient location during evaluation: PACU Anesthesia Type: General Level of consciousness: awake and alert Pain management: pain level controlled Vital Signs Assessment: post-procedure vital signs reviewed and stable Respiratory status: spontaneous breathing, nonlabored ventilation and respiratory function stable Cardiovascular status: blood pressure returned to baseline and stable Postop Assessment: no apparent nausea or vomiting Anesthetic complications: no    Last Vitals:  Vitals:   06/11/19 1945 06/11/19 1948  BP: 100/64 101/65  Pulse: 85 83  Resp: (!) 33 (!) 22  Temp: 36.6 C   SpO2: 98% 96%    Last Pain:  Vitals:   06/11/19 1930  TempSrc:   PainSc: 0-No pain                 Miguel Christiana,W. EDMOND

## 2019-06-11 NOTE — Op Note (Addendum)
OPERATIVE REPORT   06/11/2019  7:16 PM  PATIENT:  Michelle Doyle   SURGEON:  Bertram Savin, MD  ASSISTANT:  Staff.   PREOPERATIVE DIAGNOSIS: Native left knee septic arthritis.  POSTOPERATIVE DIAGNOSIS:  Same.  PROCEDURE:  Open arthrotomy left knee with limited synovectomy.  ANESTHESIA:   GETA.  ANTIBIOTICS:  2 g Ancef.  IMPLANTS:  None.  SPECIMENS:  1. Left knee synovium for tissue culture. 2. Left knee synovial fluid for culture.  COMPLICATIONS: None.  DISPOSITION: Stable to PACU.  SURGICAL INDICATIONS:  [name] is a 29 y.o. female who is an IV drug abuser.  She presented to the hospital with 2 days of left knee pain and swelling.  Aspiration was performed in the emergency department showing 21,000 white blood cells with 92% neutrophils.  Prelim blood cultures growing MSSA.  Diagnosis of septic arthritis was made.  She was indicated for open arthrotomy and drainage.  The risks, benefits, and alternatives were discussed with the patient preoperatively including but not limited to the risks of recurrent infection, bleeding, nerve / blood vessel injury, arthritis, stiffness, cardiopulmonary complications, the need for repeat surgery, among others, and the patient was willing to proceed.  PROCEDURE IN DETAIL: The patient was identified in the holding area using 2 identifiers.  The surgical site was marked by myself.  Anesthesia placed an adductor canal block.  She was taken the operating room, placed supine on the operating table.  General anesthesia was obtained.  All bony prominences were well-padded.  Nonsterile tourniquet was applied to the left upper thigh but not utilized.  The left lower extremity was prepped and draped in the normal sterile surgical fashion.  Timeout was called, verifying side and site of surgery.  Patient did receive IV antibiotics within 60 minutes of beginning the procedure.  I made a 2 inch longitudinal incision centered over the proximal  third of the patella.  Blunt dissection was performed until I reached the extensor mechanism.  A limited medial parapatellar arthrotomy was created with a #10 blade, taking care to protect underlying articular cartilage.  Upon entering the knee, there was a small amount of cloudy joint fluid.  This was swabbed for culture.  There was not a significant amount of synovial inflammation.  I performed a limited synovectomy of the suprapatellar pouch.  A representative tissue sample of synovium was sent for tissue culture.  The knee was then irrigated with 9 L of saline using pulse lavage.  Hemostasis was achieved with electrocautery.  I closed the arthrotomy with #1 PDS over a medium Hemovac drain.  Deep dermal layer was closed with 2-0 Monocryl.  Skin was reapproximated with staples.  Aquacel dressing was placed.  She was then awakened from anesthesia, taken to the PACU in stable condition.  Sponge, needle, and instrument counts were correct at the end of the case x2.  There were no known complications.  POSTOPERATIVE PLAN: Readmit to critical care.  Weight-bear as tolerated left lower extremity.  Continue IV antibiotics.  Follow intraoperative tissue culture.  Will require infectious disease consult for antibiotic selection and duration.  Plan to discontinue drain within the next couple of days.

## 2019-06-11 NOTE — Progress Notes (Signed)
A consult was received from an ED physician for Vancomycin, cefepime per pharmacy dosing.  The patient's profile has been reviewed for ht/wt/allergies/indication/available labs.   A one time order has been placed for Vancomycin 1gm iv x1, and Cefepime 2gm iv x1.  Further antibiotics/pharmacy consults should be ordered by admitting physician if indicated.                       Thank you, Nani Skillern Crowford 06/11/2019  12:23 AM

## 2019-06-11 NOTE — Anesthesia Preprocedure Evaluation (Addendum)
Anesthesia Evaluation  Patient identified by MRN, date of birth, ID band Patient awake    Reviewed: Allergy & Precautions, H&P , NPO status , Patient's Chart, lab work & pertinent test results  Airway Mallampati: II  TM Distance: >3 FB Neck ROM: Full    Dental no notable dental hx. (+) Poor Dentition, Dental Advisory Given   Pulmonary Current Smoker and Patient abstained from smoking.,    Pulmonary exam normal breath sounds clear to auscultation       Cardiovascular negative cardio ROS   Rhythm:Regular Rate:Normal     Neuro/Psych negative neurological ROS  negative psych ROS   GI/Hepatic negative GI ROS, (+)     substance abuse  methamphetamine use and IV drug use, Hepatitis -, C  Endo/Other  negative endocrine ROS  Renal/GU negative Renal ROS  negative genitourinary   Musculoskeletal  (+) narcotic dependent  Abdominal   Peds  Hematology negative hematology ROS (+)   Anesthesia Other Findings   Reproductive/Obstetrics negative OB ROS                            Anesthesia Physical Anesthesia Plan  ASA: III  Anesthesia Plan: General   Post-op Pain Management:    Induction: Intravenous  PONV Risk Score and Plan: 3 and Ondansetron, Dexamethasone and Midazolam  Airway Management Planned: LMA  Additional Equipment:   Intra-op Plan:   Post-operative Plan: Extubation in OR  Informed Consent: I have reviewed the patients History and Physical, chart, labs and discussed the procedure including the risks, benefits and alternatives for the proposed anesthesia with the patient or authorized representative who has indicated his/her understanding and acceptance.     Dental advisory given  Plan Discussed with: CRNA  Anesthesia Plan Comments:         Anesthesia Quick Evaluation

## 2019-06-11 NOTE — ED Notes (Signed)
Patient aware that we need urine sample for testing, unable at this time. Pt given instruction on providing urine sample when able to do so.   

## 2019-06-11 NOTE — Progress Notes (Addendum)
NAME:  Michelle Doyle, MRN:  [id number], DOB:  04/28/1990, LOS: 0 ADMISSION DATE:  06/10/2019, CONSULTATION DATE: 06/11/2019 REFERRING MD:  Dr. Kathrynn Humble, CHIEF COMPLAINT: Heroin withdrawal  Brief History   29 year old F with history of IV heroin use and untreated hepatitis C+ admitted with concern for acute heroin withdrawal and sepsis.  Reported last use of heroin 3 days prior to admission. CT chest/abdomen/pelvis consistent with septic pulmonary emboli and 2 splenic infarctions.     Past Medical History  Opioid abuse Hepatitis C  Significant Hospital Events   4/08 Admit   Consults:  Ortho - Dr. Delfino Lovett   Procedures:    Significant Diagnostic Tests:   CT chest/abdomen/pelvis 4/8 >> consistent with septic pulmonary emboli and 2 splenic infarctions  UDS 4/8 >> amphetamine positive ECHO 4/8 >>   Micro Data:  Covid 4/8 >> Negative  Influenza A/B 4/8 >> negative Blood cultures 4/8 >> Urine culture 4/8 >>  Antimicrobials:  Vancomycin 4/8 >> Cefepime 4/8 >>  Interim history/subjective:  Afebrile No I/O recorded  Pt complains of left knee pain  States she was not suicidal but "did not want to be thrown out of the ER" Reports shortness of breath  Objective   Blood pressure 106/60, pulse 97, temperature 98.4 F (36.9 C), temperature source Oral, resp. rate (!) 25, height 5\' 7"  (1.702 m), weight 49.9 kg, last menstrual period 05/27/2019, SpO2 96 %.        Intake/Output Summary (Last 24 hours) at 06/11/2019 1110 Last data filed at 06/11/2019 0239 Gross per 24 hour  Intake 2300 ml  Output --  Net 2300 ml   Filed Weights   06/10/19 2344  Weight: 49.9 kg    Examination: General: adult female, appears older than documented age, laying on stretcher in NAD  HEENT: MM pink/dry, poor dentition, pink hair Neuro: AAOx4, speech clear, MAE CV: s1s2 RRR, no m/r/g PULM: non-labored on RA, lungs bilaterally clear  GI: soft, bsx4 active  Extremities: warm/dry, no peripheral edema,  left knee with suspected effusion, left medial circular lesion / dry.  R knee dry circular lesion Skin: no rashes or lesions  Resolved Hospital Problem list     Assessment & Plan:   Sepsis  Initial concern for possible shock but responded to IVF.  Never required vasopressors. 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 in lungs, concern for possible left knee effusion  -continue empiric abx -follow cultures -downgrade admission to tele  -continue IVF's -trend PCT  -await ECHO -Ortho consulted, appreciate input for knee aspiration  Opioid abuse History of IV heroin abuse, last use 3 days prior to admission -follow for opioid withdrawal  -PRN percocet for pain  -antiemetics, antidiarrheals as needed   Hyponatremia Hypokalemia -follow up BMP now  -Trend BMP / urinary output -Replace electrolytes as indicated -Avoid nephrotoxic agents, ensure adequate renal perfusion  Elevated troponin Concern for endocarditis -follow tele, await ECHO   Thrombocytopenia  -follow CBC  Patient not Suicidal  -Denies intent, states she did not want to be thrown out of the ER / discharged as she is homeless / lives in a hotel  -SW consult for homelessness, drug abuse  Best practice:  Diet: NPO Pain/Anxiety/Delirium protocol (if indicated): PRN percocet VAP protocol (if indicated): Not applicable DVT prophylaxis: hold for knee aspiration  GI prophylaxis: PPI Glucose control: Monitor Mobility: Bedrest Code Status: Full Family Communication: Patient updated on the plan of care 4/8 Disposition: tele  Labs   CBC: Recent Labs  Lab 06/11/19  0005  WBC 24.6*  NEUTROABS 21.1*  HGB 9.4*  HCT 27.7*  MCV 78.2*  PLT 135*    Basic Metabolic Panel: Recent Labs  Lab 06/11/19 0005  NA 127*  K 3.2*  CL 89*  CO2 25  GLUCOSE 109*  BUN 27*  CREATININE 0.76  CALCIUM 7.8*   GFR: Estimated Creatinine Clearance: 83.2 mL/min (by C-G formula based on SCr of 0.76  mg/dL). Recent Labs  Lab 06/11/19 0005 06/11/19 0232  WBC 24.6*  --   LATICACIDVEN 2.6* 1.9    Liver Function Tests: Recent Labs  Lab 06/11/19 0005  AST 162*  ALT 47*  ALKPHOS 139*  BILITOT 1.3*  PROT 7.6  ALBUMIN 2.0*   No results for input(s): LIPASE, AMYLASE in the last 168 hours. No results for input(s): AMMONIA in the last 168 hours.  ABG No results found for: PHART, PCO2ART, PO2ART, HCO3, TCO2, ACIDBASEDEF, O2SAT   Coagulation Profile: Recent Labs  Lab 06/11/19 0005  INR 1.2    Cardiac Enzymes: Recent Labs  Lab 06/11/19 0232  CKTOTAL 146    HbA1C: No results found for: HGBA1C  CBG: No results for input(s): GLUCAP in the last 168 hours.    Critical care time:  n/a   Noe Gens, MSN, NP-C [facility] 06/11/2019, 11:14 AM   Please see [email or web address] for pager details.

## 2019-06-12 DIAGNOSIS — F1510 Other stimulant abuse, uncomplicated: Secondary | ICD-10-CM

## 2019-06-12 DIAGNOSIS — A419 Sepsis, unspecified organism: Secondary | ICD-10-CM

## 2019-06-12 DIAGNOSIS — F1110 Opioid abuse, uncomplicated: Secondary | ICD-10-CM

## 2019-06-12 DIAGNOSIS — F419 Anxiety disorder, unspecified: Secondary | ICD-10-CM

## 2019-06-12 DIAGNOSIS — M549 Dorsalgia, unspecified: Secondary | ICD-10-CM

## 2019-06-12 DIAGNOSIS — F17200 Nicotine dependence, unspecified, uncomplicated: Secondary | ICD-10-CM

## 2019-06-12 DIAGNOSIS — F329 Major depressive disorder, single episode, unspecified: Secondary | ICD-10-CM

## 2019-06-12 DIAGNOSIS — R519 Headache, unspecified: Secondary | ICD-10-CM

## 2019-06-12 DIAGNOSIS — M79672 Pain in left foot: Secondary | ICD-10-CM

## 2019-06-12 DIAGNOSIS — D735 Infarction of spleen: Secondary | ICD-10-CM

## 2019-06-12 DIAGNOSIS — B9561 Methicillin susceptible Staphylococcus aureus infection as the cause of diseases classified elsewhere: Secondary | ICD-10-CM

## 2019-06-12 DIAGNOSIS — L989 Disorder of the skin and subcutaneous tissue, unspecified: Secondary | ICD-10-CM

## 2019-06-12 DIAGNOSIS — I2690 Septic pulmonary embolism without acute cor pulmonale: Secondary | ICD-10-CM

## 2019-06-12 DIAGNOSIS — R7881 Bacteremia: Secondary | ICD-10-CM

## 2019-06-12 DIAGNOSIS — M25521 Pain in right elbow: Secondary | ICD-10-CM

## 2019-06-12 DIAGNOSIS — M79671 Pain in right foot: Secondary | ICD-10-CM

## 2019-06-12 LAB — CBC
HCT: 26.3 % — ABNORMAL LOW (ref 36.0–46.0)
Hemoglobin: 8.7 g/dL — ABNORMAL LOW (ref 12.0–15.0)
MCH: 26.4 pg (ref 26.0–34.0)
MCHC: 33.1 g/dL (ref 30.0–36.0)
MCV: 79.7 fL — ABNORMAL LOW (ref 80.0–100.0)
Platelets: 105 10*3/uL — ABNORMAL LOW (ref 150–400)
RBC: 3.3 MIL/uL — ABNORMAL LOW (ref 3.87–5.11)
RDW: 14.7 % (ref 11.5–15.5)
WBC: 17.3 10*3/uL — ABNORMAL HIGH (ref 4.0–10.5)
nRBC: 0 % (ref 0.0–0.2)

## 2019-06-12 LAB — BASIC METABOLIC PANEL
Anion gap: 9 (ref 5–15)
BUN: 22 mg/dL — ABNORMAL HIGH (ref 6–20)
CO2: 24 mmol/L (ref 22–32)
Calcium: 7.4 mg/dL — ABNORMAL LOW (ref 8.9–10.3)
Chloride: 105 mmol/L (ref 98–111)
Creatinine, Ser: 0.7 mg/dL (ref 0.44–1.00)
GFR calc Af Amer: >60 mL/min (ref >60)
GFR calc non Af Amer: >60 mL/min (ref >60)
Glucose, Bld: 227 mg/dL — ABNORMAL HIGH (ref 70–99)
Potassium: 3.3 mmol/L — ABNORMAL LOW (ref 3.5–5.1)
Sodium: 138 mmol/L (ref 135–145)

## 2019-06-12 LAB — SURGICAL PCR SCREEN
MRSA, PCR: NEGATIVE
Staphylococcus aureus: POSITIVE — AB

## 2019-06-12 LAB — LACTIC ACID, PLASMA: Lactic Acid, Venous: 3 mmol/L (ref 0.5–1.9)

## 2019-06-12 MED ORDER — FAMOTIDINE 20 MG PO TABS
20.0000 mg | ORAL_TABLET | Freq: Two times a day (BID) | ORAL | Status: DC
  Administered 2019-06-12 – 2019-06-14 (×6): 20 mg via ORAL
  Filled 2019-06-12 (×6): qty 1

## 2019-06-12 MED ORDER — SODIUM CHLORIDE 0.9 % IV SOLN: INTRAVENOUS | Status: DC

## 2019-06-12 MED ORDER — MORPHINE SULFATE (PF) 2 MG/ML IV SOLN
2.0000 mg | INTRAVENOUS | Status: DC | PRN
  Administered 2019-06-12 – 2019-06-14 (×8): 2 mg via INTRAVENOUS
  Filled 2019-06-12 (×8): qty 1

## 2019-06-12 MED ORDER — SODIUM CHLORIDE 0.9 % IV SOLN
2.0000 g | INTRAVENOUS | Status: DC
  Administered 2019-06-12 – 2019-06-15 (×16): 2 g via INTRAVENOUS
  Filled 2019-06-12 (×2): qty 2000
  Filled 2019-06-12 (×6): qty 2
  Filled 2019-06-12: qty 2000
  Filled 2019-06-12 (×4): qty 2
  Filled 2019-06-12: qty 2000
  Filled 2019-06-12 (×5): qty 2

## 2019-06-12 MED ORDER — NAFCILLIN SODIUM 2 G IJ SOLR: 2.0000 g | INTRAMUSCULAR | Status: DC

## 2019-06-12 MED ORDER — GUAIFENESIN-DM 100-10 MG/5ML PO SYRP
5.0000 mL | ORAL_SOLUTION | ORAL | Status: DC | PRN
  Administered 2019-06-12: 5 mL via ORAL
  Filled 2019-06-12: qty 10

## 2019-06-12 NOTE — Progress Notes (Signed)
PHARMACIST - PHYSICIAN COMMUNICATION  DR:   Tawanna Solo  CONCERNING: IV to Oral Route Change Policy  RECOMMENDATION: This patient is receiving famotidine by the intravenous route.  Based on criteria approved by the Pharmacy and Therapeutics Committee, the intravenous medication(s) is/are being converted to the equivalent oral dose form(s).   DESCRIPTION: These criteria include:  The patient is eating (either orally or via tube) and/or has been taking other orally administered medications for a least 24 hours  The patient has no evidence of active gastrointestinal bleeding or impaired GI absorption (gastrectomy, short bowel, patient on TNA or NPO).  If you have questions about this conversion, please contact the Pharmacy Department  []   [phone] )  [facility] []   [phone] )  [facility] []   [phone] )  [facility] []   [phone] )  [facility] [x]   [phone] )  [place], PharmD, BCPS 06/12/2019 10:05 AM

## 2019-06-12 NOTE — Progress Notes (Signed)
CRITICAL VALUE ALERT  Critical Value:  Lactic acid 3.0  Date & Time Notied:  0947  Provider Notified: Tawanna Solo

## 2019-06-12 NOTE — Progress Notes (Signed)
    [facility] has been requested to perform a transesophageal echocardiogram on 04/12 at 72 AM by Dr. Marlou Porch for endocarditis.  After careful review of history and examination, the risks and benefits of transesophageal echocardiogram have been explained including risks of esophageal damage, perforation (1:10,000 risk), bleeding, pharyngeal hematoma as well as other potential complications associated with conscious sedation including aspiration, arrhythmia, respiratory failure and death. Alternatives to treatment were discussed, questions were answered. Patient is willing to proceed.   Rosaria Ferries, PA-C 06/12/2019 4:52 PM

## 2019-06-12 NOTE — Progress Notes (Signed)
Subjective:  Patient reports pain as severe.  Denies N/V/CP/SOB. Hurts all over. 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7 (L) 06/12/2019   PLT 105 (L) 06/12/2019   BMET    Component Value Date/Time   NA 138 06/12/2019 0849   K 3.3 (L) 06/12/2019 0849   CL 105 06/12/2019 0849   CO2 24 06/12/2019 0849   GLUCOSE 227 (H) 06/12/2019 0849   BUN 22 (H) 06/12/2019 0849   CREATININE 0.70 06/12/2019 0849   CALCIUM 7.4 (L) 06/12/2019 0849   GFRNONAA >60 06/12/2019 0849   GFRAA >60 06/12/2019 0849    Recent Results (from the past 240 hour(s))  Culture, blood (Routine x 2)     Status: None (Preliminary result)   Collection Time: 06/11/19 12:05 AM   Specimen: BLOOD  Result Value Ref Range Status   Specimen Description   Final    BLOOD BLOOD LEFT HAND Performed at [facility], [address] [place], [place] [address]    Special Requests   Final    BOTTLES DRAWN AEROBIC ONLY Blood Culture adequate volume Performed at [place] [address], [place], [place] [address]    Culture  Setup Time   Final    GRAM POSITIVE COCCI IN CLUSTERS AEROBIC BOTTLE ONLY Performed at [facility], [place] [address], [place], [place] [address]    Culture GRAM POSITIVE COCCI  Final   Report Status PENDING  Incomplete  Urine culture      Status: None (Preliminary result)   Collection Time: 06/11/19 12:05 AM   Specimen: In/Out Cath Urine  Result Value Ref Range Status   Specimen Description   Final    IN/OUT CATH URINE Performed at [facility], [place] [address] [place], [place] [address]    Special Requests   Final    NONE Performed at [facility], [place] [address], [place], [place] [address]    Culture   Final    CULTURE REINCUBATED FOR BETTER GROWTH Performed at [facility], [place] [address] [place], [place] [address]    Report Status PENDING  Incomplete  Culture, blood (Routine x 2)     Status: Abnormal (Preliminary result)   Collection Time: 06/11/19 12:10 AM   Specimen: BLOOD  Result Value Ref Range Status   Specimen Description   Final    BLOOD BLOOD RIGHT FOREARM Performed at [place] [address], [place], [place] [address]    Special Requests   Final    BOTTLES DRAWN AEROBIC ONLY Blood Culture adequate volume Performed at [place] [address] [place], [place] [address]  Culture  Setup Time   Final    GRAM POSITIVE COCCI IN CLUSTERS AEROBIC BOTTLE ONLY CRITICAL RESULT CALLED TO, READ BACK BY AND VERIFIED WITH: Geronimo [id number] FCP Performed at [facility], [place] [address], [place], [place] [address]    Culture STAPHYLOCOCCUS AUREUS (A)  Final   Report Status PENDING  Incomplete  Blood Culture ID Panel (Reflexed)     Status: Abnormal   Collection Time: 06/11/19 12:10 AM  Result Value Ref Range Status   Enterococcus species NOT DETECTED NOT DETECTED Final   Listeria monocytogenes NOT DETECTED NOT DETECTED Final   Staphylococcus species DETECTED (A) NOT DETECTED Final    Comment: CRITICAL RESULT CALLED TO, READ BACK BY AND VERIFIED WITH: PHARMD MICHELLE B. [id number] FCP    Staphylococcus aureus (BCID) DETECTED (A) NOT DETECTED Final    Comment: Methicillin (oxacillin)  susceptible Staphylococcus aureus (MSSA). Preferred therapy is anti staphylococcal beta lactam antibiotic (Cefazolin or Nafcillin), unless clinically contraindicated. CRITICAL RESULT CALLED TO, READ BACK BY AND VERIFIED WITH: PHARMD MICHELLE B. [id number] FCP    Methicillin resistance NOT DETECTED NOT DETECTED Final   Streptococcus species NOT DETECTED NOT DETECTED Final   Streptococcus agalactiae NOT DETECTED NOT DETECTED Final   Streptococcus pneumoniae NOT DETECTED NOT DETECTED Final   Streptococcus pyogenes NOT DETECTED NOT DETECTED Final   Acinetobacter baumannii NOT DETECTED NOT DETECTED Final   Enterobacteriaceae species NOT DETECTED NOT DETECTED Final   Enterobacter cloacae complex NOT DETECTED NOT DETECTED Final   Escherichia coli NOT DETECTED NOT DETECTED Final   Klebsiella oxytoca NOT DETECTED NOT DETECTED Final   Klebsiella pneumoniae NOT DETECTED NOT DETECTED Final   Proteus species NOT DETECTED NOT DETECTED Final   Serratia marcescens NOT DETECTED NOT DETECTED Final   Haemophilus influenzae NOT DETECTED NOT DETECTED Final   Neisseria meningitidis NOT DETECTED NOT DETECTED Final   Pseudomonas aeruginosa NOT DETECTED NOT DETECTED Final   Candida albicans NOT DETECTED NOT DETECTED Final   Candida glabrata NOT DETECTED NOT DETECTED Final   Candida krusei NOT DETECTED NOT DETECTED Final   Candida parapsilosis NOT DETECTED NOT DETECTED Final   Candida tropicalis NOT DETECTED NOT DETECTED Final    Comment: Performed at [facility], [place] [address], [place], [place] [address]  SARS CORONAVIRUS 2 (TAT 6-24 HRS) Nasopharyngeal Nasopharyngeal Swab     Status: None   Collection Time: 06/11/19 12:52 AM   Specimen: Nasopharyngeal Swab  Result Value Ref Range Status   SARS Coronavirus 2 NEGATIVE NEGATIVE Final    Comment: (NOTE) SARS-CoV-2 target nucleic acids are NOT DETECTED. The SARS-CoV-2 RNA is generally detectable in upper and lower respiratory specimens during the acute  phase of infection. Negative results do not preclude SARS-CoV-2 infection, do not rule out co-infections with other pathogens, and should not be used as the sole basis for treatment or other patient management decisions. Negative results must be combined with clinical observations, patient history, and epidemiological information. The expected result is Negative. Fact Sheet for Patients: SugarRoll.be Fact Sheet for Healthcare Providers: https://www.woods-mathews.com/ This test is not yet approved or cleared by the [place] FDA and  has been authorized for detection and/or diagnosis of SARS-CoV-2 by FDA under an Emergency Use Authorization (EUA). This EUA will remain  in effect (meaning this test can be used) for the duration of the COVID-19 declaration under Section 56 4(b)(1) of the Act, 21 U.S.C. section 360bbb-3(b)(1), unless the authorization is terminated or revoked sooner. Performed  at [facility], [place] [address], [place], [place] [address]   Respiratory Panel by RT PCR (Flu A&B, Covid) - Nasopharyngeal Swab     Status: None   Collection Time: 06/11/19  5:25 AM   Specimen: Nasopharyngeal Swab  Result Value Ref Range Status   SARS Coronavirus 2 by RT PCR NEGATIVE NEGATIVE Final    Comment: (NOTE) SARS-CoV-2 target nucleic acids are NOT DETECTED. The SARS-CoV-2 RNA is generally detectable in upper respiratoy specimens during the acute phase of infection. The lowest concentration of SARS-CoV-2 viral copies this assay can detect is 131 copies/mL. A negative result does not preclude SARS-Cov-2 infection and should not be used as the sole basis for treatment or other patient management decisions. A negative result may occur with  improper specimen collection/handling, submission of specimen other than nasopharyngeal swab, presence of viral mutation(s) within the areas targeted by this assay, and inadequate number of viral  copies (<131 copies/mL). A negative result must be combined with clinical observations, patient history, and epidemiological information. The expected result is Negative. Fact Sheet for Patients:  PinkCheek.be Fact Sheet for Healthcare Providers:  GravelBags.it This test is not yet ap proved or cleared by the [place] FDA and  has been authorized for detection and/or diagnosis of SARS-CoV-2 by FDA under an Emergency Use Authorization (EUA). This EUA will remain  in effect (meaning this test can be used) for the duration of the COVID-19 declaration under Section 564(b)(1) of the Act, 21 U.S.C. section 360bbb-3(b)(1), unless the authorization is terminated or revoked sooner.    Influenza A by PCR NEGATIVE NEGATIVE Final   Influenza B by PCR NEGATIVE NEGATIVE Final    Comment: (NOTE) The Xpert Xpress SARS-CoV-2/FLU/RSV assay is intended as an aid in  the diagnosis of influenza from Nasopharyngeal swab specimens and  should not be used as a sole basis for treatment. Nasal washings and  aspirates are unacceptable for Xpert Xpress SARS-CoV-2/FLU/RSV  testing. Fact Sheet for Patients: PinkCheek.be Fact Sheet for Healthcare Providers: GravelBags.it This test is not yet approved or cleared by the [place] FDA and  has been authorized for detection and/or diagnosis of SARS-CoV-2 by  FDA under an Emergency Use Authorization (EUA). This EUA will remain  in effect (meaning this test can be used) for the duration of the  Covid-19 declaration under Section 564(b)(1) of the Act, 21  U.S.C. section 360bbb-3(b)(1), unless the authorization is  terminated or revoked. Performed at [facility], [place] [address], [place], [place] [address]   Body fluid culture     Status: None (Preliminary result)   Collection Time: 06/11/19  2:03 PM   Specimen: Synovium;  Synovial Fluid  Result Value Ref Range Status   Specimen Description SYNOVIAL FLUID KNEE LEFT  Final   Special Requests   Final    NONE Performed at [place] [address] [place], [place] [address]    Gram Stain   Final    ABUNDANT WBC PRESENT,BOTH PMN AND MONONUCLEAR NO ORGANISMS SEEN Gram Stain Report Called to,Read Back By and Verified With: DR. Lyla Glassing AT 1512 ON 06/11/19 BY N.THOMPSON Performed at [facility], [place] [address], [place], [place] [address]    Culture   Final    NO GROWTH < 24 HOURS Performed at [place] [address], [place], [place] [address]    Report Status PENDING  Incomplete  Anaerobic culture     Status: None (Preliminary result)  Collection Time: 06/11/19  2:03 PM   Specimen: Synovium; Synovial Fluid  Result Value Ref Range Status   Specimen Description SYNOVIAL FLUID KNEE LEFT  Final   Special Requests   Final    NONE Performed at [place] [address] [place], [place] [address]    Gram Stain PENDING  Incomplete   Culture PENDING  Incomplete   Report Status PENDING  Incomplete  Aerobic/Anaerobic Culture (surgical/deep wound)     Status: None (Preliminary result)   Collection Time: 06/11/19  6:30 PM   Specimen: Synovial, Left Knee; Body Fluid  Result Value Ref Range Status   Specimen Description   Final    SYNOVIAL KNEE LEFT Performed at [place] [address] [place], [place] [address]    Special Requests   Final    NONE Performed at [facility], [place] [address] [place], [place] [address]    Gram Stain   Final    RARE WBC PRESENT, PREDOMINANTLY PMN NO ORGANISMS SEEN    Culture   Final    CULTURE REINCUBATED FOR BETTER GROWTH Performed at [facility], [place] [address] [place], [place] [address]    Report Status PENDING  Incomplete  Surgical PCR screen     Status: Abnormal   Collection Time: 06/11/19   8:49 PM   Specimen: Nasal Mucosa; Nasal Swab  Result Value Ref Range Status   MRSA, PCR NEGATIVE NEGATIVE Final   Staphylococcus aureus POSITIVE (A) NEGATIVE Final    Comment: (NOTE) The Xpert SA Assay (FDA approved for NASAL specimens in patients 64 years of age and older), is one component of a comprehensive surveillance program. It is not intended to diagnose infection nor to guide or monitor treatment. Performed at [facility], [place] [address] [place], [place] [address]       Assessment/Plan: 1 Day Post-Op   Active Problems:   Endocarditis   Sepsis ([place])  MSSA bacteremia Likely MSSA L knee septic arthritis, intraop cultures pending  WBAT with walker DVT ppx: Lovenox, SCDs, TEDS PO pain control PT/OT IV abx per ID Will need 6 weeks IV abx Opiate withdrawal per medical team  Dispo: d/c HV drain, dispo per medical team   Hilton Cork Bryant 06/12/2019, 2:13 PM   Rod Can, MD [phone] [place] is now [facility]  [place] [address], [address], [place], [place] [address] Phone: [phone] [email or web address] Facebook  [employer]

## 2019-06-12 NOTE — Consult Note (Addendum)
Date of Admission:  06/10/2019          Reason for Consult:  methicillin sensitive Staph aureus bacteremia    Referring Provider: [facility]   Assessment:  1. 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 Right-sided septic embolization to the lungs 3. Splenic infarct 4. Apparent right septic knee status post open arthrotomy 5. 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 Right elbow pain 7. Diffuse back pain and feet pain 8. Headaches 9. 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 I will change her to nafcillin to ensure optimal CNS penetration in case she has left-sided endocarditis with septic embolization to the brain 2. Repeat blood cultures 3. She needs a transesophageal echocardiogram 4. When pain is better controlled I would recommend getting an MRI of the brain to make sure we do not have septic embolization to the brain 5. If elbow pain persists we will get an MRI of her right elbow 6. She needs more optimal pain control because she seems to have hyperesthesia which is making it difficult to distinguish focal pain from pain from withdrawal.  She is asking for her methadone 7. She will need to stay in the hospital completely 6 weeks of IV therapy  Dr Johnnye Sima is available for questions this weekend for questions and Dr. Baxter Flattery will take over the service on Monday  Active Problems:   Endocarditis   Sepsis ([place])   Scheduled Meds: . docusate sodium  100 mg Oral BID  . enoxaparin (LOVENOX) injection  40 mg Subcutaneous Q24H  . famotidine  20 mg Oral BID  . mupirocin ointment  1 application Nasal BID  . nafcillin  2 g Intravenous Q4H   Continuous Infusions: . sodium chloride 100 mL/hr at 06/12/19 1127   PRN Meds:.diphenoxylate-atropine, docusate sodium, metoCLOPramide **OR** metoCLOPramide (REGLAN) injection, morphine  injection, ondansetron **OR** ondansetron (ZOFRAN) IV, oxyCODONE-acetaminophen, polyethylene glycol  HPI: Michelle Doyle is a 29 y.o. female with history of injection drug use who is been admitted to the hospital with complaints of nausea and vomiting thought to be due to opiate withdrawal.  She also had chest pain shortness of back and back pain.  She was tachypneic and tachycardic on admission with positive troponins elevated liver function tests and lactic acidosis CT chest abdomen pelvis showed evidence of septic pulmonary emboli and 2 splenic infarctions.  She was hypotensive but responded to fluids blood cultures were obtained and she was placed on vancomycin and cefepime.  In the interim blood cultures to come back positive for methicillin sensitive Staph aureus.  2D echocardiogram performed shows a large tricuspid valve vegetation and the possibility of a mitral valve vegetation.  She had severe knee pain and her knee joint was aspirated and sent for cell count differential culture.  Due to suspicion of septic knee she has had open arthrotomy by Dr. Lyla Glassing.  On exam today she is complaining of pain in her knee pain in her hands bilaterally feet and her right elbow as well as back pain and headaches.  She does have evidence of what seem to be Osler's nodes on her hands.  I will change her to IV nafcillin to affect optimal CNS penetration.  She does need a transesophageal echocardiogram.  She may also need cardiothoracic surgery consultation but would get TEE first.  When she is better able to  tolerate it I would get an MRI of her brain and also image other sites that are suggestive of metastatic foci of infection.       Review of Systems: Review of Systems  Constitutional: Positive for chills and fever. Negative for malaise/fatigue and weight loss.  HENT: Negative for congestion and sore throat.   Eyes: Negative for blurred vision and photophobia.  Respiratory: Positive for sputum  production. Negative for cough, shortness of breath and wheezing.   Cardiovascular: Positive for chest pain, palpitations and leg swelling.  Gastrointestinal: Negative for abdominal pain, blood in stool, constipation, diarrhea, heartburn, melena, nausea and vomiting.  Genitourinary: Negative for dysuria, flank pain and hematuria.  Musculoskeletal: Positive for back pain, joint pain and myalgias.  Skin: Negative for itching and rash.  Neurological: Positive for headaches. Negative for dizziness, focal weakness, loss of consciousness and weakness.  Endo/Heme/Allergies: Does not bruise/bleed easily.  Psychiatric/Behavioral: Positive for depression and substance abuse. Negative for suicidal ideas. The patient is nervous/anxious. The patient does not have insomnia.     Past Medical History:  Diagnosis Date  . Hepatitis C   . Opiate abuse, continuous (H[facility])     Social History   Tobacco Use  . Smoking status: Current Every Day Smoker  . Smokeless tobacco: Never Used  Substance Use Topics  . Alcohol use: Not Currently  . Drug use: Yes    Types: Amphetamines    Comment: IV drug use daily (april 2021)    History reviewed. No pertinent family history. No Known Allergies  OBJECTIVE: Blood pressure 94/76, pulse 64, temperature 97.7 F (36.5 C), temperature source Oral, resp. rate 18, height 5\' 7"  (1.702 m), weight 49.9 kg, last menstrual period 05/27/2019, SpO2 98 %.  Physical Exam Constitutional:      General: She is not in acute distress.    Appearance: She is not diaphoretic.  HENT:     Head: Normocephalic and atraumatic.     Right Ear: External ear normal.     Left Ear: External ear normal.     Nose: Nose normal.     Mouth/Throat:     Pharynx: No oropharyngeal exudate.  Eyes:     General: No scleral icterus.    Extraocular Movements: Extraocular movements intact.     Conjunctiva/sclera: Conjunctivae normal.     Pupils: Pupils are equal, round, and reactive to light.    Cardiovascular:     Rate and Rhythm: Regular rhythm. Tachycardia present.     Heart sounds: Normal heart sounds. No murmur. No friction rub. No gallop.   Pulmonary:     Effort: Pulmonary effort is normal. No respiratory distress.     Breath sounds: Normal breath sounds. No stridor. No wheezing, rhonchi or rales.  Abdominal:     General: Bowel sounds are normal. There is no distension.     Palpations: Abdomen is soft.     Tenderness: There is no abdominal tenderness. There is no rebound.  Musculoskeletal:        General: Swelling present. No tenderness. Normal range of motion.     Cervical back: Normal range of motion and neck supple.  Lymphadenopathy:     Cervical: No cervical adenopathy.  Skin:    General: Skin is warm and dry.     Coloration: Skin is not pale.     Findings: No erythema or rash.  Neurological:     General: No focal deficit present.     Mental Status: She is alert and oriented to person, place,  and time.     Coordination: Coordination normal.  Psychiatric:        Attention and Perception: Attention and perception normal.        Mood and Affect: Mood is anxious.        Speech: Speech normal.        Behavior: Behavior is cooperative.        Thought Content: Thought content normal.        Cognition and Memory: Cognition normal.        Judgment: Judgment normal.    Hands 06/12/2019:   Lesions on legs        Lab Results Lab Results  Component Value Date   WBC 17.3 (H) 06/12/2019   HGB 8.7 (L) 06/12/2019   HCT 26.3 (L) 06/12/2019   MCV 79.7 (L) 06/12/2019   PLT 105 (L) 06/12/2019    Lab Results  Component Value Date   CREATININE 0.70 06/12/2019   BUN 22 (H) 06/12/2019   NA 138 06/12/2019   K 3.3 (L) 06/12/2019   CL 105 06/12/2019   CO2 24 06/12/2019    Lab Results  Component Value Date   ALT 47 (H) 06/11/2019   AST 162 (H) 06/11/2019   ALKPHOS 139 (H) 06/11/2019   BILITOT 1.3 (H) 06/11/2019     Microbiology: Recent Results (from the  past 240 hour(s))  Culture, blood (Routine x 2)     Status: None (Preliminary result)   Collection Time: 06/11/19 12:05 AM   Specimen: BLOOD  Result Value Ref Range Status   Specimen Description   Final    BLOOD BLOOD LEFT HAND Performed at [facility], [place] [address], [place], [place] [address]    Special Requests   Final    BOTTLES DRAWN AEROBIC ONLY Blood Culture adequate volume Performed at [facility] [address], [place], [place] [address]    Culture  Setup Time   Final    GRAM POSITIVE COCCI IN CLUSTERS AEROBIC BOTTLE ONLY Performed at [facility], [place] [address] [place], [place] [address]    Culture GRAM POSITIVE COCCI  Final   Report Status PENDING  Incomplete  Urine culture     Status: None (Preliminary result)   Collection Time: 06/11/19 12:05 AM   Specimen: In/Out Cath Urine  Result Value Ref Range Status   Specimen Description   Final    IN/OUT CATH URINE Performed at [facility], [place] [address] [place], [place] [address]    Special Requests   Final    NONE Performed at [facility], [place] [address], [place], [place] [address]    Culture   Final    CULTURE REINCUBATED FOR BETTER GROWTH Performed at [facility], [place] [address], [place], [place] [address]    Report Status PENDING  Incomplete  Culture, blood (Routine x 2)     Status: Abnormal (Preliminary result)   Collection Time: 06/11/19 12:10 AM   Specimen: BLOOD  Result Value Ref Range Status   Specimen Description   Final    BLOOD BLOOD RIGHT FOREARM Performed at [facility] [address] [place], [place] [address]    Special Requests   Final    BOTTLES DRAWN AEROBIC ONLY Blood Culture adequate volume Performed at [facility] [address], [place],  [address]    Culture  Setup Time   Final    GRAM POSITIVE COCCI IN CLUSTERS  AEROBIC BOTTLE  ONLY CRITICAL RESULT CALLED TO, READ BACK BY AND VERIFIED WITH: Elmore B. [id number] FCP Performed at [facility], [place] [address] [place], [place] [address]    Culture STAPHYLOCOCCUS AUREUS (A)  Final   Report Status PENDING  Incomplete  Blood Culture ID Panel (Reflexed)     Status: Abnormal   Collection Time: 06/11/19 12:10 AM  Result Value Ref Range Status   Enterococcus species NOT DETECTED NOT DETECTED Final   Listeria monocytogenes NOT DETECTED NOT DETECTED Final   Staphylococcus species DETECTED (A) NOT DETECTED Final    Comment: CRITICAL RESULT CALLED TO, READ BACK BY AND VERIFIED WITH: PHARMD MICHELLE B. [id number] FCP    Staphylococcus aureus (BCID) DETECTED (A) NOT DETECTED Final    Comment: Methicillin (oxacillin) susceptible Staphylococcus aureus (MSSA). Preferred therapy is anti staphylococcal beta lactam antibiotic (Cefazolin or Nafcillin), unless clinically contraindicated. CRITICAL RESULT CALLED TO, READ BACK BY AND VERIFIED WITH: PHARMD MICHELLE B. [id number] FCP    Methicillin resistance NOT DETECTED NOT DETECTED Final   Streptococcus species NOT DETECTED NOT DETECTED Final   Streptococcus agalactiae NOT DETECTED NOT DETECTED Final   Streptococcus pneumoniae NOT DETECTED NOT DETECTED Final   Streptococcus pyogenes NOT DETECTED NOT DETECTED Final   Acinetobacter baumannii NOT DETECTED NOT DETECTED Final   Enterobacteriaceae species NOT DETECTED NOT DETECTED Final   Enterobacter cloacae complex NOT DETECTED NOT DETECTED Final   Escherichia coli NOT DETECTED NOT DETECTED Final   Klebsiella oxytoca NOT DETECTED NOT DETECTED Final   Klebsiella pneumoniae NOT DETECTED NOT DETECTED Final   Proteus species NOT DETECTED NOT DETECTED Final   Serratia marcescens NOT DETECTED NOT DETECTED Final   Haemophilus influenzae NOT DETECTED NOT DETECTED Final   Neisseria meningitidis NOT DETECTED NOT DETECTED Final   Pseudomonas aeruginosa NOT DETECTED NOT DETECTED  Final   Candida albicans NOT DETECTED NOT DETECTED Final   Candida glabrata NOT DETECTED NOT DETECTED Final   Candida krusei NOT DETECTED NOT DETECTED Final   Candida parapsilosis NOT DETECTED NOT DETECTED Final   Candida tropicalis NOT DETECTED NOT DETECTED Final    Comment: Performed at [facility], [place] [address], [place], [place] [address]  SARS CORONAVIRUS 2 (TAT 6-24 HRS) Nasopharyngeal Nasopharyngeal Swab     Status: None   Collection Time: 06/11/19 12:52 AM   Specimen: Nasopharyngeal Swab  Result Value Ref Range Status   SARS Coronavirus 2 NEGATIVE NEGATIVE Final    Comment: (NOTE) SARS-CoV-2 target nucleic acids are NOT DETECTED. The SARS-CoV-2 RNA is generally detectable in upper and lower respiratory specimens during the acute phase of infection. Negative results do not preclude SARS-CoV-2 infection, do not rule out co-infections with other pathogens, and should not be used as the sole basis for treatment or other patient management decisions. Negative results must be combined with clinical observations, patient history, and epidemiological information. The expected result is Negative. Fact Sheet for Patients: SugarRoll.be Fact Sheet for Healthcare Providers: https://www.woods-mathews.com/ This test is not yet approved or cleared by the [place] FDA and  has been authorized for detection and/or diagnosis of SARS-CoV-2 by FDA under an Emergency Use Authorization (EUA). This EUA will remain  in effect (meaning this test can be used) for the duration of the COVID-19 declaration under Section 56 4(b)(1) of the Act, 21 U.S.C. section 360bbb-3(b)(1), unless the authorization is terminated or revoked sooner. Performed at [facility], [place] [address], [place], [place] [address]   Respiratory  Panel by RT PCR (Flu A&B, Covid) - Nasopharyngeal Swab     Status: None   Collection Time: 06/11/19  5:25 AM   Specimen:  Nasopharyngeal Swab  Result Value Ref Range Status   SARS Coronavirus 2 by RT PCR NEGATIVE NEGATIVE Final    Comment: (NOTE) SARS-CoV-2 target nucleic acids are NOT DETECTED. The SARS-CoV-2 RNA is generally detectable in upper respiratoy specimens during the acute phase of infection. The lowest concentration of SARS-CoV-2 viral copies this assay can detect is 131 copies/mL. A negative result does not preclude SARS-Cov-2 infection and should not be used as the sole basis for treatment or other patient management decisions. A negative result may occur with  improper specimen collection/handling, submission of specimen other than nasopharyngeal swab, presence of viral mutation(s) within the areas targeted by this assay, and inadequate number of viral copies (<131 copies/mL). A negative result must be combined with clinical observations, patient history, and epidemiological information. The expected result is Negative. Fact Sheet for Patients:  PinkCheek.be Fact Sheet for Healthcare Providers:  GravelBags.it This test is not yet ap proved or cleared by the [place] FDA and  has been authorized for detection and/or diagnosis of SARS-CoV-2 by FDA under an Emergency Use Authorization (EUA). This EUA will remain  in effect (meaning this test can be used) for the duration of the COVID-19 declaration under Section 564(b)(1) of the Act, 21 U.S.C. section 360bbb-3(b)(1), unless the authorization is terminated or revoked sooner.    Influenza A by PCR NEGATIVE NEGATIVE Final   Influenza B by PCR NEGATIVE NEGATIVE Final    Comment: (NOTE) The Xpert Xpress SARS-CoV-2/FLU/RSV assay is intended as an aid in  the diagnosis of influenza from Nasopharyngeal swab specimens and  should not be used as a sole basis for treatment. Nasal washings and  aspirates are unacceptable for Xpert Xpress SARS-CoV-2/FLU/RSV  testing. Fact Sheet for  Patients: PinkCheek.be Fact Sheet for Healthcare Providers: GravelBags.it This test is not yet approved or cleared by the [place] FDA and  has been authorized for detection and/or diagnosis of SARS-CoV-2 by  FDA under an Emergency Use Authorization (EUA). This EUA will remain  in effect (meaning this test can be used) for the duration of the  Covid-19 declaration under Section 564(b)(1) of the Act, 21  U.S.C. section 360bbb-3(b)(1), unless the authorization is  terminated or revoked. Performed at [facility], [place] [address] [place], [place] [address]   Body fluid culture     Status: None (Preliminary result)   Collection Time: 06/11/19  2:03 PM   Specimen: Synovium; Synovial Fluid  Result Value Ref Range Status   Specimen Description SYNOVIAL FLUID KNEE LEFT  Final   Special Requests   Final    NONE Performed at [place] [address] [place], [place] [address]    Gram Stain   Final    ABUNDANT WBC PRESENT,BOTH PMN AND MONONUCLEAR NO ORGANISMS SEEN Gram Stain Report Called to,Read Back By and Verified With: DR. Lyla Glassing AT 1512 ON 06/11/19 BY N.THOMPSON Performed at [facility], [place] [address] [place], [place] [address]    Culture   Final    NO GROWTH < 24 HOURS Performed at [facility] [address], [place], [place] [address]    Report Status PENDING  Incomplete  Anaerobic culture     Status: None (Preliminary result)   Collection Time: 06/11/19  2:03 PM   Specimen: Synovium; Synovial Fluid  Result  Value Ref Range Status   Specimen Description SYNOVIAL FLUID KNEE LEFT  Final   Special Requests   Final    NONE Performed at [facility] [address], [place], [place] [address]    Gram Stain PENDING  Incomplete   Culture PENDING  Incomplete   Report Status PENDING  Incomplete  Aerobic/Anaerobic Culture  (surgical/deep wound)     Status: None (Preliminary result)   Collection Time: 06/11/19  6:30 PM   Specimen: Synovial, Left Knee; Body Fluid  Result Value Ref Range Status   Specimen Description   Final    SYNOVIAL KNEE LEFT Performed at [facility] [address], [place], [place] [address]    Special Requests   Final    NONE Performed at [facility], [place] [address] [place], [place] [address]    Gram Stain   Final    RARE WBC PRESENT, PREDOMINANTLY PMN NO ORGANISMS SEEN Performed at [facility], [place] [address] [place], [place] [address]    Culture PENDING  Incomplete   Report Status PENDING  Incomplete  Surgical PCR screen     Status: Abnormal   Collection Time: 06/11/19  8:49 PM   Specimen: Nasal Mucosa; Nasal Swab  Result Value Ref Range Status   MRSA, PCR NEGATIVE NEGATIVE Final   Staphylococcus aureus POSITIVE (A) NEGATIVE Final    Comment: (NOTE) The Xpert SA Assay (FDA approved for NASAL specimens in patients 81 years of age and older), is one component of a comprehensive surveillance program. It is not intended to diagnose infection nor to guide or monitor treatment. Performed at [facility], [place] [address], [place], [place] [address]     Alcide Evener, [place] [phone] pager  06/12/2019, 1:00 PM

## 2019-06-12 NOTE — Progress Notes (Addendum)
PROGRESS NOTE    Michelle Doyle  ATF:[id number] DOB: 04/28/1991 DOA: 06/10/2019 PCP: Patient, No Pcp Per   Brief Narrative:  29 year old female with history of IV heroin use, untreated hepatitis C who was initially admitted for acute heroin withdrawal and sepsis.  CT imaging showed showed pulmonary emboli, splenic infarction.  She was suspected to be in septic shock on presentation and was admitted under PCCM service for vasopressors but she responded to IV fluid.  Currently on broad-spectrum antibiotics.  She also has left knee effusion consistent with septic arthritis.  Orthopedics doing open I&D of the left knee.  ID following for MSSA bacteremia.   Assessment & Plan:   Active Problems:   Endocarditis   Sepsis ([place])   MSSA bacteremia: ID following.  Currently on nafcillin.  Still has significant leukocytosis.  Afebrile, hemodynamically stable.  Plan for 6 weeks of IV antibiotics.  Repeat blood culture sent. ID recommended to get MRI of the brain to rule out septic emboli.  Will get MRI when her pain is better so that she can sit on the MRI table. Started on IV fluids.  Endocarditis: Echocardiogram showed ejection fraction of 60-65%, normal left ventricular function. Definitive 1.1 x1.7  tricuspid valve vegetation, possible mitral valve vegetation.  We have requested for TEE.  Left knee septic arthritis: Status post open I&D of the left knee.  Orthopedics following.  Continue pain management, supportive care.  Septic emboli/splenic infarcts: Associated with sepsis.  Currently hemodynamically stable.  Opiate abuse: Uses IV heroin.  On Percocet/morphine for pain.  Monitor for withdrawal.  Checking hepatitis panel, ESR/CRP  Hyponatremia/hypokalemia: Continue to monitor and supplement.  Elevated troponin: Likely from  endocarditis.  Denies any chest pain.  Thrombocytopenia: Continue to monitor  Homelessness: requested  social worker evaluation.        DVT  prophylaxis:Loevnox Code Status: Full Family Communication: None Disposition Plan: Patient is homeless.  Anticipate prolonged hospital course due to IV drug abuse, needing IV antibiotics for bacteremia/endocarditis.   Consultants: ID, PCCM  Procedures: I&D of the left knee.  Antimicrobials:  Anti-infectives (From admission, onward)   Start     Dose/Rate Route Frequency Ordered Stop   06/11/19 1645  ceFAZolin (ANCEF) IVPB 2g/100 mL premix  Status:  Discontinued     2 g 200 mL/hr over 30 Minutes Intravenous On call to O.R. 06/11/19 1626 06/11/19 1955   06/11/19 1600  ceFAZolin (ANCEF) IVPB 2g/100 mL premix     2 g 200 mL/hr over 30 Minutes Intravenous Every 8 hours 06/11/19 1514     06/11/19 1200  vancomycin (VANCOREADY) IVPB 750 mg/150 mL  Status:  Discontinued     750 mg 150 mL/hr over 60 Minutes Intravenous Every 12 hours 06/11/19 0642 06/11/19 1514   06/11/19 0630  ceFEPIme (MAXIPIME) 2 g in sodium chloride 0.9 % 100 mL IVPB  Status:  Discontinued     2 g 200 mL/hr over 30 Minutes Intravenous Every 8 hours 06/11/19 0622 06/11/19 1514   06/11/19 0615  vancomycin (VANCOCIN) IVPB 1000 mg/200 mL premix  Status:  Discontinued    Note to Pharmacy: pharmacy to dose   1,000 mg 200 mL/hr over 60 Minutes Intravenous Every 24 hours 06/11/19 0607 06/11/19 0621   06/11/19 0615  ceFEPIme (MAXIPIME) 1 g in sodium chloride 0.9 % 100 mL IVPB  Status:  Discontinued     1 g 200 mL/hr over 30 Minutes Intravenous Every 8 hours 06/11/19 0607 06/11/19 0622   06/11/19 0030  vancomycin (VANCOCIN)  IVPB 1000 mg/200 mL premix     1,000 mg 200 mL/hr over 60 Minutes Intravenous  Once 06/11/19 0015 06/11/19 0238   06/11/19 0030  ceFEPIme (MAXIPIME) 2 g in sodium chloride 0.9 % 100 mL IVPB     2 g 200 mL/hr over 30 Minutes Intravenous  Once 06/11/19 0015 06/11/19 0121      Subjective: Patient seen and examined at the bedside this morning.  Was complaining of diffuse pain all over her body.  She was very  emotional, tearful to hear that she will be here in the hospital for prolonged period of time.  She said she had 3 children at home.  Objective: Vitals:   06/11/19 2001 06/11/19 2207 06/12/19 0126 06/12/19 0506  BP: 101/66 92/64 (!) 94/55 90/74  Pulse: 96 72 81 63  Resp: (!) 25 (!) '22 20 20  ' Temp: 98.1 F (36.7 C) 97.6 F (36.4 C) 98.1 F (36.7 C) 97.9 F (36.6 C)  TempSrc: Oral Oral Oral Oral  SpO2: 98% 97% 99% 100%  Weight:      Height:        Intake/Output Summary (Last 24 hours) at 06/12/2019 0823 Last data filed at 06/12/2019 0507 Gross per 24 hour  Intake 1830 ml  Output 1230 ml  Net 600 ml   Filed Weights   06/10/19 2344  Weight: 49.9 kg    Examination:  General exam: Emotional, uncomfortable due to pain Respiratory system: Faint crackles on the lung bases Cardiovascular system: Sinus tachycardia. No JVD, murmurs, rubs, gallops or clicks. No pedal edema. Gastrointestinal system: Abdomen is nondistended, soft and nontender. No organomegaly or masses felt. Normal bowel sounds heard. Central nervous system: Alert and oriented. No focal neurological deficits. Extremities: No edema, no clubbing ,no cyanosis, left knee wrapped with dressing  skin: Scattered macular rashes, needle marks on the legs    Data Reviewed: I have personally reviewed following labs and imaging studies  CBC: Recent Labs  Lab 06/11/19 0005 06/12/19 0605  WBC 24.6* 17.3*  NEUTROABS 21.1*  --   HGB 9.4* 8.7*  HCT 27.7* 26.3*  MCV 78.2* 79.7*  PLT 135* 045*   Basic Metabolic Panel: Recent Labs  Lab 06/11/19 0005  NA 127*  K 3.2*  CL 89*  CO2 25  GLUCOSE 109*  BUN 27*  CREATININE 0.76  CALCIUM 7.8*   GFR: Estimated Creatinine Clearance: 83.9 mL/min (by C-G formula based on SCr of 0.76 mg/dL). Liver Function Tests: Recent Labs  Lab 06/11/19 0005  AST 162*  ALT 47*  ALKPHOS 139*  BILITOT 1.3*  PROT 7.6  ALBUMIN 2.0*   No results for input(s): LIPASE, AMYLASE in the last  168 hours. No results for input(s): AMMONIA in the last 168 hours. Coagulation Profile: Recent Labs  Lab 06/11/19 0005  INR 1.2   Cardiac Enzymes: Recent Labs  Lab 06/11/19 0232  CKTOTAL 146   BNP (last 3 results) No results for input(s): PROBNP in the last 8760 hours. HbA1C: No results for input(s): HGBA1C in the last 72 hours. CBG: No results for input(s): GLUCAP in the last 168 hours. Lipid Profile: No results for input(s): CHOL, HDL, LDLCALC, TRIG, CHOLHDL, LDLDIRECT in the last 72 hours. Thyroid Function Tests: No results for input(s): TSH, T4TOTAL, FREET4, T3FREE, THYROIDAB in the last 72 hours. Anemia Panel: No results for input(s): VITAMINB12, FOLATE, FERRITIN, TIBC, IRON, RETICCTPCT in the last 72 hours. Sepsis Labs: Recent Labs  Lab 06/11/19 0005 06/11/19 0232  LATICACIDVEN 2.6* 1.9  Recent Results (from the past 240 hour(s))  Culture, blood (Routine x 2)     Status: None (Preliminary result)   Collection Time: 06/11/19 12:05 AM   Specimen: BLOOD  Result Value Ref Range Status   Specimen Description   Final    BLOOD BLOOD LEFT HAND Performed at [facility] [address], [place], [place] [address]    Special Requests   Final    BOTTLES DRAWN AEROBIC ONLY Blood Culture adequate volume Performed at [facility] [address] [place], [place] [address]    Culture  Setup Time   Final    GRAM POSITIVE COCCI IN CLUSTERS AEROBIC BOTTLE ONLY Performed at [facility], [place] [address] [place], [place] [address]    Culture GRAM POSITIVE COCCI  Final   Report Status PENDING  Incomplete  Culture, blood (Routine x 2)     Status: None (Preliminary result)   Collection Time: 06/11/19 12:10 AM   Specimen: BLOOD  Result Value Ref Range Status   Specimen Description   Final    BLOOD BLOOD RIGHT FOREARM Performed at [facility] [address], [place], [place] [address]    Special Requests    Final    BOTTLES DRAWN AEROBIC ONLY Blood Culture adequate volume Performed at [facility] [address], [place], [place] [address]    Culture  Setup Time   Final    GRAM POSITIVE COCCI IN CLUSTERS AEROBIC BOTTLE ONLY CRITICAL RESULT CALLED TO, READ BACK BY AND VERIFIED WITH: [place] [phone] FCP Performed at [facility], [place] [address] [place],  [address]    Culture GRAM POSITIVE COCCI  Final   Report Status PENDING  Incomplete  Blood Culture ID Panel (Reflexed)     Status: Abnormal   Collection Time: 06/11/19 12:10 AM  Result Value Ref Range Status   Enterococcus species NOT DETECTED NOT DETECTED Final   Listeria monocytogenes NOT DETECTED NOT DETECTED Final   Staphylococcus species DETECTED (A) NOT DETECTED Final    Comment: CRITICAL RESULT CALLED TO, READ BACK BY AND VERIFIED WITH: PHARMD MICHELLE B. [phone] FCP    Staphylococcus aureus (BCID) DETECTED (A) NOT DETECTED Final    Comment: Methicillin (oxacillin) susceptible Staphylococcus aureus (MSSA). Preferred therapy is anti staphylococcal beta lactam antibiotic (Cefazolin or Nafcillin), unless clinically contraindicated. CRITICAL RESULT CALLED TO, READ BACK BY AND VERIFIED WITH: PHARMD MICHELLE B. [phone] FCP    Methicillin resistance NOT DETECTED NOT DETECTED Final   Streptococcus species NOT DETECTED NOT DETECTED Final   Streptococcus agalactiae NOT DETECTED NOT DETECTED Final   Streptococcus pneumoniae NOT DETECTED NOT DETECTED Final   Streptococcus pyogenes NOT DETECTED NOT DETECTED Final   Acinetobacter baumannii NOT DETECTED NOT DETECTED Final   Enterobacteriaceae species NOT DETECTED NOT DETECTED Final   Enterobacter cloacae complex NOT DETECTED NOT DETECTED Final   Escherichia coli NOT DETECTED NOT DETECTED Final   Klebsiella oxytoca NOT DETECTED NOT DETECTED Final   Klebsiella pneumoniae NOT DETECTED NOT DETECTED Final   Proteus species NOT DETECTED NOT  DETECTED Final   Serratia marcescens NOT DETECTED NOT DETECTED Final   Haemophilus influenzae NOT DETECTED NOT DETECTED Final   Neisseria meningitidis NOT DETECTED NOT DETECTED Final   Pseudomonas aeruginosa NOT DETECTED NOT DETECTED Final   Candida albicans NOT DETECTED NOT DETECTED Final   Candida glabrata NOT DETECTED NOT DETECTED Final   Candida krusei NOT DETECTED NOT DETECTED  Final   Candida parapsilosis NOT DETECTED NOT DETECTED Final   Candida tropicalis NOT DETECTED NOT DETECTED Final    Comment: Performed at [facility], [place] [address] [place], [place] [address]  SARS CORONAVIRUS 2 (TAT 6-24 HRS) Nasopharyngeal Nasopharyngeal Swab     Status: None   Collection Time: 06/11/19 12:52 AM   Specimen: Nasopharyngeal Swab  Result Value Ref Range Status   SARS Coronavirus 2 NEGATIVE NEGATIVE Final    Comment: (NOTE) SARS-CoV-2 target nucleic acids are NOT DETECTED. The SARS-CoV-2 RNA is generally detectable in upper and lower respiratory specimens during the acute phase of infection. Negative results do not preclude SARS-CoV-2 infection, do not rule out co-infections with other pathogens, and should not be used as the sole basis for treatment or other patient management decisions. Negative results must be combined with clinical observations, patient history, and epidemiological information. The expected result is Negative. Fact Sheet for Patients: SugarRoll.be Fact Sheet for Healthcare Providers: https://www.woods-mathews.com/ This test is not yet approved or cleared by the [place] FDA and  has been authorized for detection and/or diagnosis of SARS-CoV-2 by FDA under an Emergency Use Authorization (EUA). This EUA will remain  in effect (meaning this test can be used) for the duration of the COVID-19 declaration under Section 56 4(b)(1) of the Act, 21 U.S.C. section 360bbb-3(b)(1), unless the authorization is terminated  or revoked sooner. Performed at [facility], [place] [address], [place], [place] [address]   Respiratory Panel by RT PCR (Flu A&B, Covid) - Nasopharyngeal Swab     Status: None   Collection Time: 06/11/19  5:25 AM   Specimen: Nasopharyngeal Swab  Result Value Ref Range Status   SARS Coronavirus 2 by RT PCR NEGATIVE NEGATIVE Final    Comment: (NOTE) SARS-CoV-2 target nucleic acids are NOT DETECTED. The SARS-CoV-2 RNA is generally detectable in upper respiratoy specimens during the acute phase of infection. The lowest concentration of SARS-CoV-2 viral copies this assay can detect is 131 copies/mL. A negative result does not preclude SARS-Cov-2 infection and should not be used as the sole basis for treatment or other patient management decisions. A negative result may occur with  improper specimen collection/handling, submission of specimen other than nasopharyngeal swab, presence of viral mutation(s) within the areas targeted by this assay, and inadequate number of viral copies (<131 copies/mL). A negative result must be combined with clinical observations, patient history, and epidemiological information. The expected result is Negative. Fact Sheet for Patients:  PinkCheek.be Fact Sheet for Healthcare Providers:  GravelBags.it This test is not yet ap proved or cleared by the [place] FDA and  has been authorized for detection and/or diagnosis of SARS-CoV-2 by FDA under an Emergency Use Authorization (EUA). This EUA will remain  in effect (meaning this test can be used) for the duration of the COVID-19 declaration under Section 564(b)(1) of the Act, 21 U.S.C. section 360bbb-3(b)(1), unless the authorization is terminated or revoked sooner.    Influenza A by PCR NEGATIVE NEGATIVE Final   Influenza B by PCR NEGATIVE NEGATIVE Final    Comment: (NOTE) The Xpert Xpress SARS-CoV-2/FLU/RSV assay is intended as an aid in   the diagnosis of influenza from Nasopharyngeal swab specimens and  should not be used as a sole basis for treatment. Nasal washings and  aspirates are unacceptable for Xpert Xpress SARS-CoV-2/FLU/RSV  testing. Fact Sheet for Patients: PinkCheek.be Fact Sheet for Healthcare Providers: GravelBags.it This test is not yet approved or cleared by the [place]  FDA and  has been authorized for detection and/or diagnosis of SARS-CoV-2 by  FDA under an Emergency Use Authorization (EUA). This EUA will remain  in effect (meaning this test can be used) for the duration of the  Covid-19 declaration under Section 564(b)(1) of the Act, 21  U.S.C. section 360bbb-3(b)(1), unless the authorization is  terminated or revoked. Performed at [facility], [place] [address] [place], [place] [address]   Body fluid culture     Status: None (Preliminary result)   Collection Time: 06/11/19  2:03 PM   Specimen: Synovium; Synovial Fluid  Result Value Ref Range Status   Specimen Description SYNOVIAL FLUID KNEE LEFT  Final   Special Requests NONE  Final   Gram Stain   Final    ABUNDANT WBC PRESENT,BOTH PMN AND MONONUCLEAR NO ORGANISMS SEEN Gram Stain Report Called to,Read Back By and Verified With: DR. Lyla Glassing AT 1512 ON 06/11/19 BY N.THOMPSON Performed at [facility], [place] [address] [place], [place] [address]    Culture PENDING  Incomplete   Report Status PENDING  Incomplete  Anaerobic culture     Status: None (Preliminary result)   Collection Time: 06/11/19  2:03 PM   Specimen: Synovium; Synovial Fluid  Result Value Ref Range Status   Specimen Description SYNOVIAL FLUID KNEE LEFT  Final   Special Requests   Final    NONE Performed at [facility] [address], [place], [place] [address]    Gram Stain PENDING  Incomplete   Culture PENDING  Incomplete   Report Status PENDING   Incomplete  Aerobic/Anaerobic Culture (surgical/deep wound)     Status: None (Preliminary result)   Collection Time: 06/11/19  6:30 PM   Specimen: Synovial, Left Knee; Body Fluid  Result Value Ref Range Status   Specimen Description   Final    SYNOVIAL KNEE LEFT Performed at [facility] [address], [place], [place] [address]    Special Requests   Final    NONE Performed at [facility], [place] [address] [place], [place] [address]    Gram Stain   Final    RARE WBC PRESENT, PREDOMINANTLY PMN NO ORGANISMS SEEN Performed at [facility], [place] [address], [place], [place] [address]    Culture PENDING  Incomplete   Report Status PENDING  Incomplete  Surgical PCR screen     Status: Abnormal   Collection Time: 06/11/19  8:49 PM   Specimen: Nasal Mucosa; Nasal Swab  Result Value Ref Range Status   MRSA, PCR NEGATIVE NEGATIVE Final   Staphylococcus aureus POSITIVE (A) NEGATIVE Final    Comment: (NOTE) The Xpert SA Assay (FDA approved for NASAL specimens in patients 48 years of age and older), is one component of a comprehensive surveillance program. It is not intended to diagnose infection nor to guide or monitor treatment. Performed at [facility], [place] [address] [place], [place] [address]          Radiology Studies: CT Chest W Contrast  Result Date: 06/11/2019 CLINICAL DATA:  Nausea and vomiting.  Sepsis.  IV drug abuse EXAM: CT CHEST, ABDOMEN, AND PELVIS WITH CONTRAST TECHNIQUE: Multidetector CT imaging of the chest, abdomen and pelvis was performed following the standard protocol during bolus administration of intravenous contrast. CONTRAST:  26m OMNIPAQUE IOHEXOL 300 MG/ML  SOLN COMPARISON:  None. FINDINGS: CT CHEST FINDINGS Cardiovascular: Normal heart size. No pericardial effusion. No acute vascular finding. Mediastinum/Nodes: Mild generalized  lymph node enlargement considered reactive. Lungs/Pleura: Numerous  and generalized nodular ground-glass and consolidative opacities with variable cavitation. Largest cavities are measured at the right apex (2.4 cm) and lateral right base (2.6 cm). Borderline interlobular septal thickening/edema. No effusion or pneumothorax. Musculoskeletal: No acute finding CT ABDOMEN PELVIS FINDINGS Hepatobiliary: No focal liver abnormality.No evidence of biliary obstruction or stone. Pancreas: Unremarkable. Spleen: Generous size with 2 hypoenhancing wedges within the subcapsular upper and lower spleen. Adrenals/Urinary Tract: Negative adrenals. No hydronephrosis or stone. Small volume gas in the bladder, possibly from sampling. No bladder wall thickening. Stomach/Bowel: Liquid small and large bowel contents without bowel wall thickening or appendicitis. No appendicitis. Vascular/Lymphatic: No acute vascular abnormality. No mass or adenopathy. Reproductive:No pathologic findings. Other: No ascites or pneumoperitoneum. Musculoskeletal: No acute abnormalities. IMPRESSION: 1. Pattern of septic pulmonary emboli. 2. Two splenic infarcts. Electronically Signed   By: Monte Fantasia M.D.   On: 06/11/2019 04:09   CT ABDOMEN PELVIS W CONTRAST  Result Date: 06/11/2019 CLINICAL DATA:  Nausea and vomiting.  Sepsis.  IV drug abuse EXAM: CT CHEST, ABDOMEN, AND PELVIS WITH CONTRAST TECHNIQUE: Multidetector CT imaging of the chest, abdomen and pelvis was performed following the standard protocol during bolus administration of intravenous contrast. CONTRAST:  64m OMNIPAQUE IOHEXOL 300 MG/ML  SOLN COMPARISON:  None. FINDINGS: CT CHEST FINDINGS Cardiovascular: Normal heart size. No pericardial effusion. No acute vascular finding. Mediastinum/Nodes: Mild generalized lymph node enlargement considered reactive. Lungs/Pleura: Numerous and generalized nodular ground-glass and consolidative opacities with variable cavitation. Largest cavities are measured at the right apex (2.4 cm) and lateral right base (2.6 cm).  Borderline interlobular septal thickening/edema. No effusion or pneumothorax. Musculoskeletal: No acute finding CT ABDOMEN PELVIS FINDINGS Hepatobiliary: No focal liver abnormality.No evidence of biliary obstruction or stone. Pancreas: Unremarkable. Spleen: Generous size with 2 hypoenhancing wedges within the subcapsular upper and lower spleen. Adrenals/Urinary Tract: Negative adrenals. No hydronephrosis or stone. Small volume gas in the bladder, possibly from sampling. No bladder wall thickening. Stomach/Bowel: Liquid small and large bowel contents without bowel wall thickening or appendicitis. No appendicitis. Vascular/Lymphatic: No acute vascular abnormality. No mass or adenopathy. Reproductive:No pathologic findings. Other: No ascites or pneumoperitoneum. Musculoskeletal: No acute abnormalities. IMPRESSION: 1. Pattern of septic pulmonary emboli. 2. Two splenic infarcts. Electronically Signed   By: JMonte FantasiaM.D.   On: 06/11/2019 04:09   CT L-SPINE NO CHARGE  Result Date: 06/11/2019 CLINICAL DATA:  Initial evaluation for diffuse pain all over, opiate withdrawal. EXAM: CT LUMBAR SPINE WITHOUT CONTRAST TECHNIQUE: Multidetector CT imaging of the lumbar spine was performed without intravenous contrast administration. Multiplanar CT image reconstructions were also generated. COMPARISON:  None available. FINDINGS: Segmentation: Standard. Lowest well-formed disc space labeled the L5-S1 level. Alignment: Physiologic with preservation of the normal lumbar lordosis. No listhesis. Vertebrae: Vertebral body height maintained without evidence for acute or chronic fracture. No discrete or worrisome osseous lesions. No findings to suggest osteomyelitis discitis or septic arthritis. SI joints approximated symmetric. Paraspinal and other soft tissues: Paraspinous soft tissues demonstrate no acute finding. Multifocal nodular and parenchymal opacity seen within the partially visualized lungs, few of which demonstrate  cavitary change, suggesting septic emboli. Remainder the visualized visceral structures otherwise unremarkable. Disc levels: No significant disc pathology seen within the lumbar spine. No appreciable epidural abscess or other collection. Small focal calcification noted at the distal aspect of the thecal sac, of doubtful significance. No significant canal or foraminal stenosis. IMPRESSION: 1. No acute abnormality within the lumbar spine. No findings to suggest osteomyelitis  discitis or septic arthritis. 2. Multifocal nodular and parenchymal opacity within the partially visualized lungs, few of which demonstrate cavitary change, suggesting septic emboli. Electronically Signed   By: Jeannine Boga M.D.   On: 06/11/2019 03:59   DG Chest Portable 1 View  Result Date: 06/11/2019 CLINICAL DATA:  Fever and IV drug use. EXAM: PORTABLE CHEST 1 VIEW COMPARISON:  None. FINDINGS: There is scattered bilateral pulmonary opacities. Some of these opacities appear to be cavitary. There is no pneumothorax. There may be a small right-sided pleural effusion. No acute osseous abnormality. Heart size is normal. IMPRESSION: Diffuse bilateral pulmonary opacities, some of which may be cavitary, suggestive of diffuse septic emboli or multifocal pneumonia. Electronically Signed   By: Constance Holster M.D.   On: 06/11/2019 00:46   DG Knee Left Port  Result Date: 06/11/2019 CLINICAL DATA:  29 year old female with left knee pain and swelling. EXAM: PORTABLE LEFT KNEE - 1-2 VIEW COMPARISON:  None. FINDINGS: No evidence of fracture, dislocation, or joint effusion. No evidence of arthropathy or other focal bone abnormality. Soft tissues are unremarkable. IMPRESSION: Negative. Electronically Signed   By: Anner Crete M.D.   On: 06/11/2019 15:00   DG Foot 2 Views Right  Result Date: 06/11/2019 CLINICAL DATA:  Right foot pain and swelling.  IV drug abuse EXAM: RIGHT FOOT - 2 VIEW COMPARISON:  None. FINDINGS: There is no evidence of  fracture or dislocation. There is no evidence of arthropathy or other focal bone abnormality. Soft tissues are unremarkable. IMPRESSION: Negative. Electronically Signed   By: Davina Poke D.O.   On: 06/11/2019 15:01   ECHOCARDIOGRAM COMPLETE  Result Date: 06/11/2019    ECHOCARDIOGRAM REPORT   Patient Name:   Michelle Doyle Date of Exam: 06/11/2019 Medical Rec #:  [id number]    Height:       67.0 in Accession #:    [id number]   Weight:       110.0 lb Date of Birth:  04/28/1991    BSA:          1.569 m Patient Age:    28 years     BP:           108/55 mmHg Patient Gender: F            HR:           95 bpm. Exam Location:  Inpatient Procedure: 2D Echo, Color Doppler and Cardiac Doppler Indications:    Endocarditis i38  History:        Patient has no prior history of Echocardiogram examinations.                 IVDU.  Sonographer:    Raquel Sarna Senior RDCS Referring Phys: [id number] [place]  1. Left ventricular ejection fraction, by estimation, is 60 to 65%. The left ventricle has normal function. The left ventricle has no regional wall motion abnormalities. Left ventricular diastolic parameters were normal.  2. Right ventricular systolic function is normal. The right ventricular size is normal. There is normal pulmonary artery systolic pressure.  3. Thickened mitral valve. Can not exclude vegetation. . The mitral valve is abnormal. Mild to moderate mitral valve regurgitation. No evidence of mitral stenosis.  4. Thickened tricuspid valve, 1.1 x 1.7 cm vegetation. . The tricuspid valve is abnormal. Tricuspid valve regurgitation is mild to moderate.  5. The aortic valve is normal in structure. Aortic valve regurgitation is not visualized. No aortic stenosis is present.  6. The inferior vena  cava is normal in size with greater than 50% respiratory variability, suggesting right atrial pressure of 3 mmHg. Conclusion(s)/Recommendation(s): Findings concerning for tricuspid valve and possible mitral valve  vegetation, would recommend a Transesophageal Echocardiogram for clarification. FINDINGS  Left Ventricle: Left ventricular ejection fraction, by estimation, is 60 to 65%. The left ventricle has normal function. The left ventricle has no regional wall motion abnormalities. The left ventricular internal cavity size was normal in size. There is  no left ventricular hypertrophy. Left ventricular diastolic parameters were normal. Right Ventricle: The right ventricular size is normal. No increase in right ventricular wall thickness. Right ventricular systolic function is normal. There is normal pulmonary artery systolic pressure. The tricuspid regurgitant velocity is 2.52 m/s, and  with an assumed right atrial pressure of 3 mmHg, the estimated right ventricular systolic pressure is 41.6 mmHg. Left Atrium: Left atrial size was normal in size. Right Atrium: Right atrial size was normal in size. Pericardium: There is no evidence of pericardial effusion. Mitral Valve: Thickened mitral valve. Can not exclude vegetation. The mitral valve is abnormal. Normal mobility of the mitral valve leaflets. Mild to moderate mitral valve regurgitation. No evidence of mitral valve stenosis. Tricuspid Valve: Thickened tricuspid valve, 1.1 x 1.7 cm vegetation. The tricuspid valve is abnormal. Tricuspid valve regurgitation is mild to moderate. No evidence of tricuspid stenosis. Aortic Valve: The aortic valve is normal in structure. Aortic valve regurgitation is not visualized. No aortic stenosis is present. Pulmonic Valve: The pulmonic valve was normal in structure. Pulmonic valve regurgitation is not visualized. No evidence of pulmonic stenosis. Aorta: The aortic root is normal in size and structure. Venous: The inferior vena cava is normal in size with greater than 50% respiratory variability, suggesting right atrial pressure of 3 mmHg. IAS/Shunts: No atrial level shunt detected by color flow Doppler.  LEFT VENTRICLE PLAX 2D LVIDd:          4.30 cm LVIDs:         3.10 cm LV PW:         0.95 cm LV IVS:        1.04 cm LVOT diam:     2.00 cm LVOT Area:     3.14 cm  RIGHT VENTRICLE RV S prime:     16.40 cm/s TAPSE (M-mode): 2.7 cm LEFT ATRIUM             Index       RIGHT ATRIUM           Index LA diam:        3.40 cm 2.17 cm/m  RA Area:     18.10 cm LA Vol (A2C):   43.3 ml 27.61 ml/m RA Volume:   55.20 ml  35.19 ml/m LA Vol (A4C):   24.6 ml 15.68 ml/m LA Biplane Vol: 33.1 ml 21.10 ml/m   AORTA Ao Root diam: 2.60 cm Ao Asc diam:  2.90 cm TRICUSPID VALVE TR Peak grad:   25.4 mmHg TR Vmax:        252.00 cm/s  SHUNTS Systemic Diam: 2.00 cm Candee Furbish MD Electronically signed by Candee Furbish MD Signature Date/Time: 06/11/2019/3:28:02 PM    Final         Scheduled Meds: . docusate sodium  100 mg Oral BID  . enoxaparin (LOVENOX) injection  40 mg Subcutaneous Q24H  . mupirocin ointment  1 application Nasal BID   Continuous Infusions: .  ceFAZolin (ANCEF) IV 2 g (06/12/19 0226)  . famotidine (PEPCID) IV 20 mg (06/12/19  0039)     LOS: 1 day    Time spent: 35 mins.More than 50% of that time was spent in counseling and/or coordination of care.      Shelly Coss, MD [facility] P4/11/2019, 8:23 AM

## 2019-06-12 NOTE — TOC Initial Note (Addendum)
Transition of Care [facility]) - Initial/Assessment Note    Patient Details  Name: Michelle Doyle MRN: [id number] Date of Birth: [birth year]  [facility] CM/SW Contact:    Trish Mage, LCSW Phone Number: 06/12/2019, 10:07 AM  Clinical Narrative:   Waiting for results of testing to determine if patient will remain in the hospital for long term IV abx. TOC will continue to follow during the course of hospitalization.                 Expected Discharge Plan: Home/Self Care Barriers to Discharge: Other (comment)(possible 4-6 weeks of IV abx for IV drug user)   Patient Goals and CMS Choice        Expected Discharge Plan and Services Expected Discharge Plan: Home/Self Care                                              Prior Living Arrangements/Services                       Activities of Daily Living Home Assistive Devices/Equipment: None ADL Screening (condition at time of admission) Patient's cognitive ability adequate to safely complete daily activities?: Yes Is the patient deaf or have difficulty hearing?: No Does the patient have difficulty seeing, even when wearing glasses/contacts?: No Does the patient have difficulty concentrating, remembering, or making decisions?: No Patient able to express need for assistance with ADLs?: Yes Does the patient have difficulty dressing or bathing?: No Independently performs ADLs?: Yes (appropriate for developmental age) Does the patient have difficulty walking or climbing stairs?: Yes Weakness of Legs: Right Weakness of Arms/Hands: None  Permission Sought/Granted                  Emotional Assessment              Admission diagnosis:  Endocarditis [I38] Pain [R52] Septic embolism (HCC) [I76] Fever [R50.9] Septic shock ([place]) [A41.9, R65.21] Sepsis ([place]) [A41.9] Acute infective endocarditis, due to unspecified organism [I33.0] Patient Active Problem List   Diagnosis Date Noted  .  Endocarditis 06/11/2019  . Sepsis ([place]) 06/11/2019   PCP:  Patient, No Pcp Per Pharmacy:   [id number] - [place], [place] [id number] [place] [id number] Phone: [phone] Fax: [phone]     Social Determinants of Health (SDOH) Interventions    Readmission Risk Interventions No flowsheet data found.

## 2019-06-13 LAB — CULTURE, BLOOD (ROUTINE X 2)
Special Requests: ADEQUATE
Special Requests: ADEQUATE

## 2019-06-13 LAB — BASIC METABOLIC PANEL
Anion gap: 9 (ref 5–15)
BUN: 17 mg/dL (ref 6–20)
CO2: 24 mmol/L (ref 22–32)
Calcium: 7.6 mg/dL — ABNORMAL LOW (ref 8.9–10.3)
Chloride: 108 mmol/L (ref 98–111)
Creatinine, Ser: 0.66 mg/dL (ref 0.44–1.00)
GFR calc Af Amer: >60 mL/min (ref >60)
GFR calc non Af Amer: >60 mL/min (ref >60)
Glucose, Bld: 88 mg/dL (ref 70–99)
Potassium: 2.6 mmol/L — CL (ref 3.5–5.1)
Sodium: 141 mmol/L (ref 135–145)

## 2019-06-13 LAB — CBC WITH DIFFERENTIAL/PLATELET
Abs Immature Granulocytes: 0.41 10*3/uL — ABNORMAL HIGH (ref 0.00–0.07)
Basophils Absolute: 0 10*3/uL (ref 0.0–0.1)
Basophils Relative: 0 %
Eosinophils Absolute: 0 10*3/uL (ref 0.0–0.5)
Eosinophils Relative: 0 %
HCT: 27.1 % — ABNORMAL LOW (ref 36.0–46.0)
Hemoglobin: 9 g/dL — ABNORMAL LOW (ref 12.0–15.0)
Immature Granulocytes: 2 %
Lymphocytes Relative: 11 %
Lymphs Abs: 2 10*3/uL (ref 0.7–4.0)
MCH: 26 pg (ref 26.0–34.0)
MCHC: 33.2 g/dL (ref 30.0–36.0)
MCV: 78.3 fL — ABNORMAL LOW (ref 80.0–100.0)
Monocytes Absolute: 0.5 10*3/uL (ref 0.1–1.0)
Monocytes Relative: 3 %
Neutro Abs: 14.4 10*3/uL — ABNORMAL HIGH (ref 1.7–7.7)
Neutrophils Relative %: 84 %
Platelets: 183 10*3/uL (ref 150–400)
RBC: 3.46 MIL/uL — ABNORMAL LOW (ref 3.87–5.11)
RDW: 15.1 % (ref 11.5–15.5)
WBC: 17.3 10*3/uL — ABNORMAL HIGH (ref 4.0–10.5)
nRBC: 0 % (ref 0.0–0.2)

## 2019-06-13 LAB — URINE CULTURE: Culture: >=100000 — AB

## 2019-06-13 LAB — MAGNESIUM: Magnesium: 2.5 mg/dL — ABNORMAL HIGH (ref 1.7–2.4)

## 2019-06-13 LAB — LACTIC ACID, PLASMA: Lactic Acid, Venous: 2.2 mmol/L (ref 0.5–1.9)

## 2019-06-13 MED ORDER — METHADONE HCL 10 MG PO TABS
10.0000 mg | ORAL_TABLET | Freq: Once | ORAL | Status: AC
  Administered 2019-06-13: 10 mg via ORAL
  Filled 2019-06-13: qty 1

## 2019-06-13 MED ORDER — QUETIAPINE FUMARATE 100 MG PO TABS
100.0000 mg | ORAL_TABLET | Freq: Once | ORAL | Status: AC
  Administered 2019-06-13: 100 mg via ORAL
  Filled 2019-06-13: qty 1

## 2019-06-13 MED ORDER — METHADONE HCL 10 MG PO TABS: 20.0000 mg | ORAL_TABLET | Freq: Once | ORAL | Status: DC

## 2019-06-13 MED ORDER — POTASSIUM CHLORIDE CRYS ER 20 MEQ PO TBCR
40.0000 meq | EXTENDED_RELEASE_TABLET | ORAL | Status: DC
  Administered 2019-06-13 (×2): 40 meq via ORAL
  Filled 2019-06-13 (×2): qty 2

## 2019-06-13 NOTE — Progress Notes (Signed)
Pt is shaking, jittery, everything hurts, having diarrhea, requesting pain meds constantly despite being told when they are scheduled/due. Ace wrap removed from L  Knee, no drain present. Encouraged pt to stop using [place] [facility] set up beside bed for use.

## 2019-06-13 NOTE — Progress Notes (Signed)
Pt refused IV insertion

## 2019-06-13 NOTE — Progress Notes (Addendum)
PROGRESS NOTE    Michelle Doyle  QQP:[id number] DOB: 1991/02/17 DOA: 06/10/2019 PCP: Patient, No Pcp Per   Brief Narrative:  29 year old female with history of IV heroin use, untreated hepatitis C who was initially admitted for acute heroin withdrawal and sepsis.  CT imaging showed showed pulmonary emboli, splenic infarction.  She was suspected to be in septic shock on presentation and was admitted under PCCM service for vasopressors but she responded to IV fluid.  Currently on broad-spectrum antibiotics.  She also has left knee effusion consistent with septic arthritis.  Orthopedics doing open I&D of the left knee.  ID following for MSSA bacteremia.   Assessment & Plan:   Active Problems:   Endocarditis   Sepsis ([place])   MSSA bacteremia: ID following.  Currently on nafcillin.  Still has significant leukocytosis.  Afebrile, hemodynamically stable.  Plan for 6 weeks of IV antibiotics.  Repeat blood culture sent. ID recommended to get MRI of the brain to rule out septic emboli.  Will get MRI when her pain is better so that she can sit on the MRI table.  Endocarditis: Echocardiogram showed ejection fraction of 60-65%, normal left ventricular function. Definitive 1.1 x1.7  tricuspid valve vegetation, possible mitral valve vegetation.  We have requested for TEE.  Left knee septic arthritis: Status post open I&D of the left knee.  Orthopedics following.  Continue pain management, supportive care.  Septic emboli/splenic infarcts: Associated with sepsis.  Currently hemodynamically stable.  Opiate abuse: Uses IV heroin.  On Percocet/morphine for pain.  Monitor for withdrawal.  Checking hepatitis panel, ESR/CRP  Hyponatremia/hypokalemia: Continue to monitor and supplement.  Elevated troponin: Likely from  endocarditis.  Denies any chest pain.  Thrombocytopenia: Continue to monitor  Homelessness:  SW following.      DVT prophylaxis:Loevnox Code Status: Full Family Communication:  None Disposition Plan: Patient is homeless.  Anticipate prolonged hospital course due to IV drug abuse, needing IV antibiotics for bacteremia/endocarditis.   Consultants: ID, PCCM  Procedures: I&D of the left knee.  Antimicrobials:  Anti-infectives (From admission, onward)   Start     Dose/Rate Route Frequency Ordered Stop   06/12/19 1400  nafcillin 2 g in sodium chloride 0.9 % 100 mL IVPB     2 g 200 mL/hr over 30 Minutes Intravenous Every 4 hours 06/12/19 1303     06/12/19 1300  nafcillin injection 2 g  Status:  Discontinued     2 g Intravenous Every 4 hours 06/12/19 1258 06/12/19 1302   06/11/19 1645  ceFAZolin (ANCEF) IVPB 2g/100 mL premix  Status:  Discontinued     2 g 200 mL/hr over 30 Minutes Intravenous On call to O.R. 06/11/19 1626 06/11/19 1955   06/11/19 1600  ceFAZolin (ANCEF) IVPB 2g/100 mL premix  Status:  Discontinued     2 g 200 mL/hr over 30 Minutes Intravenous Every 8 hours 06/11/19 1514 06/12/19 1258   06/11/19 1200  vancomycin (VANCOREADY) IVPB 750 mg/150 mL  Status:  Discontinued     750 mg 150 mL/hr over 60 Minutes Intravenous Every 12 hours 06/11/19 0642 06/11/19 1514   06/11/19 0630  ceFEPIme (MAXIPIME) 2 g in sodium chloride 0.9 % 100 mL IVPB  Status:  Discontinued     2 g 200 mL/hr over 30 Minutes Intravenous Every 8 hours 06/11/19 0622 06/11/19 1514   06/11/19 0615  vancomycin (VANCOCIN) IVPB 1000 mg/200 mL premix  Status:  Discontinued    Note to Pharmacy: pharmacy to dose   1,000 mg 200 mL/hr  over 60 Minutes Intravenous Every 24 hours 06/11/19 0607 06/11/19 0621   06/11/19 0615  ceFEPIme (MAXIPIME) 1 g in sodium chloride 0.9 % 100 mL IVPB  Status:  Discontinued     1 g 200 mL/hr over 30 Minutes Intravenous Every 8 hours 06/11/19 0607 06/11/19 0622   06/11/19 0030  vancomycin (VANCOCIN) IVPB 1000 mg/200 mL premix     1,000 mg 200 mL/hr over 60 Minutes Intravenous  Once 06/11/19 0015 06/11/19 0238   06/11/19 0030  ceFEPIme (MAXIPIME) 2 g in sodium  chloride 0.9 % 100 mL IVPB     2 g 200 mL/hr over 30 Minutes Intravenous  Once 06/11/19 0015 06/11/19 0121      Subjective: Patient seen and examined at the bedside this morning.  I got a report that she refused all the blood work and pulled her IV line 3 times.  Currently without any IV access.  I have talked to the patient this morning and explained that it is very important to get the blood works and need to have IV line for the IV antibiotics.   Objective: Vitals:   06/12/19 0828 06/12/19 1411 06/12/19 2059 06/13/19 0600  BP: 94/76 (!) 86/54 101/73 106/66  Pulse: 64 66 70 82  Resp: '18 16 20 16  ' Temp: 97.7 F (36.5 C) 98.5 F (36.9 C) 98.3 F (36.8 C) 98.4 F (36.9 C)  TempSrc: Oral  Oral Oral  SpO2: 98% 99% 99% 98%  Weight:      Height:        Intake/Output Summary (Last 24 hours) at 06/13/2019 0836 Last data filed at 06/12/2019 1500 Gross per 24 hour  Intake 655 ml  Output --  Net 655 ml   Filed Weights   06/10/19 2344  Weight: 49.9 kg    Examination:  General exam: Sleepy, hesitant to talk Respiratory system: No wheezes or crackles Cardiovascular system:No JVD, murmurs, rubs, gallops or clicks. No pedal edema. Gastrointestinal system: Abdomen is nondistended, soft and nontender. No organomegaly or masses felt. Normal bowel sounds heard. Central nervous system: Alert and oriented. No focal neurological deficits. Extremities: No edema, no clubbing ,no cyanosis, left knee wrapped with dressing  skin: Scattered macular rashes, needle marks on the legs    Data Reviewed: I have personally reviewed following labs and imaging studies  CBC: Recent Labs  Lab 06/11/19 0005 06/12/19 0605  WBC 24.6* 17.3*  NEUTROABS 21.1*  --   HGB 9.4* 8.7*  HCT 27.7* 26.3*  MCV 78.2* 79.7*  PLT 135* 170*   Basic Metabolic Panel: Recent Labs  Lab 06/11/19 0005 06/12/19 0849  NA 127* 138  K 3.2* 3.3*  CL 89* 105  CO2 25 24  GLUCOSE 109* 227*  BUN 27* 22*  CREATININE  0.76 0.70  CALCIUM 7.8* 7.4*   GFR: Estimated Creatinine Clearance: 83.9 mL/min (by C-G formula based on SCr of 0.7 mg/dL). Liver Function Tests: Recent Labs  Lab 06/11/19 0005  AST 162*  ALT 47*  ALKPHOS 139*  BILITOT 1.3*  PROT 7.6  ALBUMIN 2.0*   No results for input(s): LIPASE, AMYLASE in the last 168 hours. No results for input(s): AMMONIA in the last 168 hours. Coagulation Profile: Recent Labs  Lab 06/11/19 0005  INR 1.2   Cardiac Enzymes: Recent Labs  Lab 06/11/19 0232  CKTOTAL 146   BNP (last 3 results) No results for input(s): PROBNP in the last 8760 hours. HbA1C: No results for input(s): HGBA1C in the last 72 hours. CBG: No  results for input(s): GLUCAP in the last 168 hours. Lipid Profile: No results for input(s): CHOL, HDL, LDLCALC, TRIG, CHOLHDL, LDLDIRECT in the last 72 hours. Thyroid Function Tests: No results for input(s): TSH, T4TOTAL, FREET4, T3FREE, THYROIDAB in the last 72 hours. Anemia Panel: No results for input(s): VITAMINB12, FOLATE, FERRITIN, TIBC, IRON, RETICCTPCT in the last 72 hours. Sepsis Labs: Recent Labs  Lab 06/11/19 0005 06/11/19 0232 06/12/19 0849  LATICACIDVEN 2.6* 1.9 3.0*    Recent Results (from the past 240 hour(s))  Culture, blood (Routine x 2)     Status: Abnormal   Collection Time: 06/11/19 12:05 AM   Specimen: BLOOD  Result Value Ref Range Status   Specimen Description   Final    BLOOD BLOOD LEFT HAND Performed at [facility] [address] [place], [place] [address]    Special Requests   Final    BOTTLES DRAWN AEROBIC ONLY Blood Culture adequate volume Performed at [facility] [address] [place], [place] [address]    Culture  Setup Time   Final    GRAM POSITIVE COCCI IN CLUSTERS AEROBIC BOTTLE ONLY CRITICAL VALUE NOTED.  VALUE IS CONSISTENT WITH PREVIOUSLY REPORTED AND CALLED VALUE.    Culture (A)  Final    STAPHYLOCOCCUS AUREUS SUSCEPTIBILITIES PERFORMED  ON PREVIOUS CULTURE WITHIN THE LAST 5 DAYS. Performed at [facility], [place] [address] [place], [place] [address]    Report Status 06/13/2019 FINAL  Final  Urine culture     Status: Abnormal   Collection Time: 06/11/19 12:05 AM   Specimen: In/Out Cath Urine  Result Value Ref Range Status   Specimen Description   Final    IN/OUT CATH URINE Performed at [facility] [address], [place], [place] [address]    Special Requests   Final    NONE Performed at [facility], [place] [address] [place], [place] [address]    Culture (A)  Final    >=100,000 COLONIES/mL KOCURIA SPECIES 10,000 COLONIES/mL STAPHYLOCOCCUS AUREUS Standardized susceptibility testing for this organism is not available. FOR KOCURIA SPECIES Performed at [facility], [place] [address], [place], [place] [address]    Report Status 06/13/2019 FINAL  Final   Organism ID, Bacteria STAPHYLOCOCCUS AUREUS (A)  Final      Susceptibility   Staphylococcus aureus - MIC*    CIPROFLOXACIN >=8 RESISTANT Resistant     GENTAMICIN <=0.5 SENSITIVE Sensitive     NITROFURANTOIN <=16 SENSITIVE Sensitive     OXACILLIN 0.5 SENSITIVE Sensitive     TETRACYCLINE <=1 SENSITIVE Sensitive     VANCOMYCIN 1 SENSITIVE Sensitive     TRIMETH/SULFA <=10 SENSITIVE Sensitive     CLINDAMYCIN <=0.25 SENSITIVE Sensitive     RIFAMPIN <=0.5 SENSITIVE Sensitive     Inducible Clindamycin NEGATIVE Sensitive     * 10,000 COLONIES/mL STAPHYLOCOCCUS AUREUS  Culture, blood (Routine x 2)     Status: Abnormal   Collection Time: 06/11/19 12:10 AM   Specimen: BLOOD  Result Value Ref Range Status   Specimen Description   Final    BLOOD BLOOD RIGHT FOREARM Performed at [facility] [address] [place], [place] [address]    Special Requests   Final    BOTTLES DRAWN AEROBIC ONLY Blood Culture adequate volume Performed at [facility] [address], [place],   [address]    Culture  Setup Time   Final    GRAM POSITIVE COCCI  IN CLUSTERS AEROBIC BOTTLE ONLY CRITICAL RESULT CALLED TO, READ BACK BY AND VERIFIED WITH: Oketo B. [phone] [id number] FCP Performed at [facility], [place] [address], [place], [place] [address]    Culture STAPHYLOCOCCUS AUREUS (A)  Final   Report Status 06/13/2019 FINAL  Final   Organism ID, Bacteria STAPHYLOCOCCUS AUREUS  Final      Susceptibility   Staphylococcus aureus - MIC*    CIPROFLOXACIN >=8 RESISTANT Resistant     ERYTHROMYCIN >=8 RESISTANT Resistant     GENTAMICIN <=0.5 SENSITIVE Sensitive     OXACILLIN 0.5 SENSITIVE Sensitive     TETRACYCLINE <=1 SENSITIVE Sensitive     VANCOMYCIN 1 SENSITIVE Sensitive     TRIMETH/SULFA <=10 SENSITIVE Sensitive     CLINDAMYCIN <=0.25 SENSITIVE Sensitive     RIFAMPIN <=0.5 SENSITIVE Sensitive     Inducible Clindamycin NEGATIVE Sensitive     * STAPHYLOCOCCUS AUREUS  Blood Culture ID Panel (Reflexed)     Status: Abnormal   Collection Time: 06/11/19 12:10 AM  Result Value Ref Range Status   Enterococcus species NOT DETECTED NOT DETECTED Final   Listeria monocytogenes NOT DETECTED NOT DETECTED Final   Staphylococcus species DETECTED (A) NOT DETECTED Final    Comment: CRITICAL RESULT CALLED TO, READ BACK BY AND VERIFIED WITH: PHARMD MICHELLE B. [phone] [id number] FCP    Staphylococcus aureus (BCID) DETECTED (A) NOT DETECTED Final    Comment: Methicillin (oxacillin) susceptible Staphylococcus aureus (MSSA). Preferred therapy is anti staphylococcal beta lactam antibiotic (Cefazolin or Nafcillin), unless clinically contraindicated. CRITICAL RESULT CALLED TO, READ BACK BY AND VERIFIED WITH: PHARMD MICHELLE B. [phone] FCP    Methicillin resistance NOT DETECTED NOT DETECTED Final   Streptococcus species NOT DETECTED NOT DETECTED Final   Streptococcus agalactiae NOT DETECTED NOT DETECTED Final   Streptococcus pneumoniae NOT DETECTED NOT DETECTED Final   Streptococcus pyogenes NOT  DETECTED NOT DETECTED Final   Acinetobacter baumannii NOT DETECTED NOT DETECTED Final   Enterobacteriaceae species NOT DETECTED NOT DETECTED Final   Enterobacter cloacae complex NOT DETECTED NOT DETECTED Final   Escherichia coli NOT DETECTED NOT DETECTED Final   Klebsiella oxytoca NOT DETECTED NOT DETECTED Final   Klebsiella pneumoniae NOT DETECTED NOT DETECTED Final   Proteus species NOT DETECTED NOT DETECTED Final   Serratia marcescens NOT DETECTED NOT DETECTED Final   Haemophilus influenzae NOT DETECTED NOT DETECTED Final   Neisseria meningitidis NOT DETECTED NOT DETECTED Final   Pseudomonas aeruginosa NOT DETECTED NOT DETECTED Final   Candida albicans NOT DETECTED NOT DETECTED Final   Candida glabrata NOT DETECTED NOT DETECTED Final   Candida krusei NOT DETECTED NOT DETECTED Final   Candida parapsilosis NOT DETECTED NOT DETECTED Final   Candida tropicalis NOT DETECTED NOT DETECTED Final    Comment: Performed at [facility], [place] [address], [place], [place] [address]  SARS CORONAVIRUS 2 (TAT 6-24 HRS) Nasopharyngeal Nasopharyngeal Swab     Status: None   Collection Time: 06/11/19 12:52 AM   Specimen: Nasopharyngeal Swab  Result Value Ref Range Status   SARS Coronavirus 2 NEGATIVE NEGATIVE Final    Comment: (NOTE) SARS-CoV-2 target nucleic acids are NOT DETECTED. The SARS-CoV-2 RNA is generally detectable in upper and lower respiratory specimens during the acute phase of infection. Negative results do not preclude SARS-CoV-2 infection, do not rule out co-infections with other pathogens, and should not be used as the sole basis for treatment or other patient management decisions. Negative results must be combined with clinical  observations, patient history, and epidemiological information. The expected result is Negative. Fact Sheet for Patients: SugarRoll.be Fact Sheet for Healthcare Providers: https://www.woods-mathews.com/ This  test is not yet approved or cleared by the [place] FDA and  has been authorized for detection and/or diagnosis of SARS-CoV-2 by FDA under an Emergency Use Authorization (EUA). This EUA will remain  in effect (meaning this test can be used) for the duration of the COVID-19 declaration under Section 56 4(b)(1) of the Act, 21 U.S.C. section 360bbb-3(b)(1), unless the authorization is terminated or revoked sooner. Performed at [facility], [place] [address] [place], [place] [address]   Respiratory Panel by RT PCR (Flu A&B, Covid) - Nasopharyngeal Swab     Status: None   Collection Time: 06/11/19  5:25 AM   Specimen: Nasopharyngeal Swab  Result Value Ref Range Status   SARS Coronavirus 2 by RT PCR NEGATIVE NEGATIVE Final    Comment: (NOTE) SARS-CoV-2 target nucleic acids are NOT DETECTED. The SARS-CoV-2 RNA is generally detectable in upper respiratoy specimens during the acute phase of infection. The lowest concentration of SARS-CoV-2 viral copies this assay can detect is 131 copies/mL. A negative result does not preclude SARS-Cov-2 infection and should not be used as the sole basis for treatment or other patient management decisions. A negative result may occur with  improper specimen collection/handling, submission of specimen other than nasopharyngeal swab, presence of viral mutation(s) within the areas targeted by this assay, and inadequate number of viral copies (<131 copies/mL). A negative result must be combined with clinical observations, patient history, and epidemiological information. The expected result is Negative. Fact Sheet for Patients:  PinkCheek.be Fact Sheet for Healthcare Providers:  GravelBags.it This test is not yet ap proved or cleared by the [place] FDA and  has been authorized for detection and/or diagnosis of SARS-CoV-2 by FDA under an Emergency Use Authorization (EUA). This EUA will  remain  in effect (meaning this test can be used) for the duration of the COVID-19 declaration under Section 564(b)(1) of the Act, 21 U.S.C. section 360bbb-3(b)(1), unless the authorization is terminated or revoked sooner.    Influenza A by PCR NEGATIVE NEGATIVE Final   Influenza B by PCR NEGATIVE NEGATIVE Final    Comment: (NOTE) The Xpert Xpress SARS-CoV-2/FLU/RSV assay is intended as an aid in  the diagnosis of influenza from Nasopharyngeal swab specimens and  should not be used as a sole basis for treatment. Nasal washings and  aspirates are unacceptable for Xpert Xpress SARS-CoV-2/FLU/RSV  testing. Fact Sheet for Patients: PinkCheek.be Fact Sheet for Healthcare Providers: GravelBags.it This test is not yet approved or cleared by the [place] FDA and  has been authorized for detection and/or diagnosis of SARS-CoV-2 by  FDA under an Emergency Use Authorization (EUA). This EUA will remain  in effect (meaning this test can be used) for the duration of the  Covid-19 declaration under Section 564(b)(1) of the Act, 21  U.S.C. section 360bbb-3(b)(1), unless the authorization is  terminated or revoked. Performed at [facility], [place] [address], [place], [place] [address]   Body fluid culture     Status: None (Preliminary result)   Collection Time: 06/11/19  2:03 PM   Specimen: Synovium; Synovial Fluid  Result Value Ref Range Status   Specimen Description SYNOVIAL FLUID KNEE LEFT  Final   Special Requests   Final    NONE Performed at [facility] [address], [place], [place] [address]    Gram Stain  Final    ABUNDANT WBC PRESENT,BOTH PMN AND MONONUCLEAR NO ORGANISMS SEEN Gram Stain Report Called to,Read Back By and Verified With: DR. Lyla Glassing AT 1512 ON 06/11/19 BY N.THOMPSON Performed at [facility], [place] [address], [place], [place] [address]    Culture    Final    NO GROWTH < 24 HOURS Performed at [facility] [address], [place], [place] [address]    Report Status PENDING  Incomplete  Anaerobic culture     Status: None (Preliminary result)   Collection Time: 06/11/19  2:03 PM   Specimen: Synovium; Synovial Fluid  Result Value Ref Range Status   Specimen Description SYNOVIAL FLUID KNEE LEFT  Final   Special Requests   Final    NONE Performed at [facility] [address], [place], [place] [address]    Gram Stain PENDING  Incomplete   Culture PENDING  Incomplete   Report Status PENDING  Incomplete  Aerobic/Anaerobic Culture (surgical/deep wound)     Status: None (Preliminary result)   Collection Time: 06/11/19  6:30 PM   Specimen: Synovial, Left Knee; Body Fluid  Result Value Ref Range Status   Specimen Description   Final    SYNOVIAL KNEE LEFT Performed at [facility] [address], [place], [place] [address]    Special Requests   Final    NONE Performed at [facility], [place] [address] [place], [place] [address]    Gram Stain   Final    RARE WBC PRESENT, PREDOMINANTLY PMN NO ORGANISMS SEEN    Culture   Final    CULTURE REINCUBATED FOR BETTER GROWTH Performed at [facility], [place] [address], [place], [place] [address]    Report Status PENDING  Incomplete  Aerobic/Anaerobic Culture (surgical/deep wound)     Status: None (Preliminary result)   Collection Time: 06/11/19  6:32 PM   Specimen: Synovium; Tissue  Result Value Ref Range Status   Specimen Description   Final    SYNOVIAL TISSUE KNEE LEFT Performed at [facility] [address] [place], [place] [address]    Special Requests   Final    NONE Performed at [facility], [place] [address] [place], [place] [address]    Gram Stain   Final    NO WBC SEEN NO ORGANISMS SEEN Performed at [facility], [place] [address], [place], [place] [address]     Culture PENDING  Incomplete   Report Status PENDING  Incomplete  Surgical PCR screen     Status: Abnormal   Collection Time: 06/11/19  8:49 PM   Specimen: Nasal Mucosa; Nasal Swab  Result Value Ref Range Status   MRSA, PCR NEGATIVE NEGATIVE Final   Staphylococcus aureus POSITIVE (A) NEGATIVE Final    Comment: (NOTE) The Xpert SA Assay (FDA approved for NASAL specimens in patients 97 years of age and older), is one component of a comprehensive surveillance program. It is not intended to diagnose infection nor to guide or monitor treatment. Performed at [facility], [place] [address], [place],  [address]          Radiology Studies: DG Knee Left Port  Result Date: 06/11/2019 CLINICAL DATA:  29 year old female with left knee pain and swelling. EXAM: PORTABLE LEFT KNEE - 1-2 VIEW COMPARISON:  None. FINDINGS: No evidence of fracture, dislocation, or joint effusion. No evidence of arthropathy or other focal bone abnormality. Soft tissues are  unremarkable. IMPRESSION: Negative. Electronically Signed   By: Anner Crete M.D.   On: 06/11/2019 15:00   DG Foot 2 Views Right  Result Date: 06/11/2019 CLINICAL DATA:  Right foot pain and swelling.  IV drug abuse EXAM: RIGHT FOOT - 2 VIEW COMPARISON:  None. FINDINGS: There is no evidence of fracture or dislocation. There is no evidence of arthropathy or other focal bone abnormality. Soft tissues are unremarkable. IMPRESSION: Negative. Electronically Signed   By: Davina Poke D.O.   On: 06/11/2019 15:01   ECHOCARDIOGRAM COMPLETE  Result Date: 06/11/2019    ECHOCARDIOGRAM REPORT   Patient Name:   Michelle Doyle Date of Exam: 06/11/2019 Medical Rec #:  [id number]    Height:       67.0 in Accession #:    [id number]   Weight:       110.0 lb Date of Birth:  04/28/1991    BSA:          1.569 m Patient Age:    28 years     BP:           108/55 mmHg Patient Gender: F            HR:           95 bpm. Exam Location:  Inpatient Procedure:  2D Echo, Color Doppler and Cardiac Doppler Indications:    Endocarditis i38  History:        Patient has no prior history of Echocardiogram examinations.                 IVDU.  Sonographer:    Raquel Sarna Senior RDCS Referring Phys: [id number] [place]  1. Left ventricular ejection fraction, by estimation, is 60 to 65%. The left ventricle has normal function. The left ventricle has no regional wall motion abnormalities. Left ventricular diastolic parameters were normal.  2. Right ventricular systolic function is normal. The right ventricular size is normal. There is normal pulmonary artery systolic pressure.  3. Thickened mitral valve. Can not exclude vegetation. . The mitral valve is abnormal. Mild to moderate mitral valve regurgitation. No evidence of mitral stenosis.  4. Thickened tricuspid valve, 1.1 x 1.7 cm vegetation. . The tricuspid valve is abnormal. Tricuspid valve regurgitation is mild to moderate.  5. The aortic valve is normal in structure. Aortic valve regurgitation is not visualized. No aortic stenosis is present.  6. The inferior vena cava is normal in size with greater than 50% respiratory variability, suggesting right atrial pressure of 3 mmHg. Conclusion(s)/Recommendation(s): Findings concerning for tricuspid valve and possible mitral valve vegetation, would recommend a Transesophageal Echocardiogram for clarification. FINDINGS  Left Ventricle: Left ventricular ejection fraction, by estimation, is 60 to 65%. The left ventricle has normal function. The left ventricle has no regional wall motion abnormalities. The left ventricular internal cavity size was normal in size. There is  no left ventricular hypertrophy. Left ventricular diastolic parameters were normal. Right Ventricle: The right ventricular size is normal. No increase in right ventricular wall thickness. Right ventricular systolic function is normal. There is normal pulmonary artery systolic pressure. The tricuspid regurgitant  velocity is 2.52 m/s, and  with an assumed right atrial pressure of 3 mmHg, the estimated right ventricular systolic pressure is 54.2 mmHg. Left Atrium: Left atrial size was normal in size. Right Atrium: Right atrial size was normal in size. Pericardium: There is no evidence of pericardial effusion. Mitral Valve: Thickened mitral valve. Can not exclude vegetation. The mitral valve is abnormal. Normal  mobility of the mitral valve leaflets. Mild to moderate mitral valve regurgitation. No evidence of mitral valve stenosis. Tricuspid Valve: Thickened tricuspid valve, 1.1 x 1.7 cm vegetation. The tricuspid valve is abnormal. Tricuspid valve regurgitation is mild to moderate. No evidence of tricuspid stenosis. Aortic Valve: The aortic valve is normal in structure. Aortic valve regurgitation is not visualized. No aortic stenosis is present. Pulmonic Valve: The pulmonic valve was normal in structure. Pulmonic valve regurgitation is not visualized. No evidence of pulmonic stenosis. Aorta: The aortic root is normal in size and structure. Venous: The inferior vena cava is normal in size with greater than 50% respiratory variability, suggesting right atrial pressure of 3 mmHg. IAS/Shunts: No atrial level shunt detected by color flow Doppler.  LEFT VENTRICLE PLAX 2D LVIDd:         4.30 cm LVIDs:         3.10 cm LV PW:         0.95 cm LV IVS:        1.04 cm LVOT diam:     2.00 cm LVOT Area:     3.14 cm  RIGHT VENTRICLE RV S prime:     16.40 cm/s TAPSE (M-mode): 2.7 cm LEFT ATRIUM             Index       RIGHT ATRIUM           Index LA diam:        3.40 cm 2.17 cm/m  RA Area:     18.10 cm LA Vol (A2C):   43.3 ml 27.61 ml/m RA Volume:   55.20 ml  35.19 ml/m LA Vol (A4C):   24.6 ml 15.68 ml/m LA Biplane Vol: 33.1 ml 21.10 ml/m   AORTA Ao Root diam: 2.60 cm Ao Asc diam:  2.90 cm TRICUSPID VALVE TR Peak grad:   25.4 mmHg TR Vmax:        252.00 cm/s  SHUNTS Systemic Diam: 2.00 cm Candee Furbish MD Electronically signed by Candee Furbish MD Signature Date/Time: 06/11/2019/3:28:02 PM    Final         Scheduled Meds: . docusate sodium  100 mg Oral BID  . enoxaparin (LOVENOX) injection  40 mg Subcutaneous Q24H  . famotidine  20 mg Oral BID  . mupirocin ointment  1 application Nasal BID   Continuous Infusions: . sodium chloride 100 mL/hr at 06/12/19 1127  . nafcillin IV 2 g (06/13/19 0241)     LOS: 2 days    Time spent: 35 mins.More than 50% of that time was spent in counseling and/or coordination of care.      Shelly Coss, MD [facility] P4/12/2019, 8:36 AM

## 2019-06-13 NOTE — Progress Notes (Addendum)
Amion text page to Berna Spare on call for [facility], I explained pt scenerio of pt with pain not being managed with Morhhine / percocet. Pt is crazy restless & crying in pain , requesting methadone & seroquel. . This provider authorized a 1 x dose of seroquel & Methadone  Pt was found in a chair in her room , crawled out foot of bed , bed alarm was on

## 2019-06-13 NOTE — Progress Notes (Signed)
IV to right upper arm. Intact and antibiotic running now.

## 2019-06-13 NOTE — Progress Notes (Signed)
Pt is heard crying out in pain, upon evaluation L hand SL is noted to be dislodged and coban is only holding tubing in place. Upon checking the rt arm IV it is noted to be infiltrated as well. Pt refused to have it removed or restarted. Asking when she can have more pain medicine. Pt refused AM labs, refused restart of an IV therefore missing 6am dose of antibiotics.

## 2019-06-13 NOTE — Progress Notes (Signed)
Alerted MD that pt is refusing IV, labs, and telemetry this morning.

## 2019-06-13 NOTE — Progress Notes (Signed)
Have attempted IV X 2, without success. IV team here now.

## 2019-06-13 NOTE — Progress Notes (Signed)
Subjective: 2 Days Post-Op Procedure(s) (LRB): IRRIGATION AND DEBRIDEMENT KNEE (Left) Patient reports pain as moderate.  Resting in bed and stating that her knee hurts  Objective: Vital signs in last 24 hours: Temp:  [97.7 F (36.5 C)-98.5 F (36.9 C)] 98.4 F (36.9 C) (04/10 0600) Pulse Rate:  [64-82] 82 (04/10 0600) Resp:  [16-20] 16 (04/10 0600) BP: (86-106)/(54-76) 106/66 (04/10 0600) SpO2:  [98 %-99 %] 98 % (04/10 0600)  Intake/Output from previous day: 04/09 0701 - 04/10 0700 In: 655 [I.V.:355; IV Piggyback:300] Out: -  Intake/Output this shift: No intake/output data recorded.  Recent Labs    06/11/19 0005 06/12/19 0605  HGB 9.4* 8.7*   Recent Labs    06/11/19 0005 06/12/19 0605  WBC 24.6* 17.3*  RBC 3.54* 3.30*  HCT 27.7* 26.3*  PLT 135* 105*   Recent Labs    06/11/19 0005 06/12/19 0849  NA 127* 138  K 3.2* 3.3*  CL 89* 105  CO2 25 24  BUN 27* 22*  CREATININE 0.76 0.70  GLUCOSE 109* 227*  CALCIUM 7.8* 7.4*   Recent Labs    06/11/19 0005  INR 1.2    No cellulitis present No knee effusion or warmth  Cultures negative so far   Assessment/Plan: 2 Days Post-Op Procedure(s) (LRB): IRRIGATION AND DEBRIDEMENT KNEE (Left) Continue IV antibiotics   Michelle Doyle 06/13/2019, 7:50 AM

## 2019-06-13 NOTE — Progress Notes (Signed)
Alert MD via text that pt's K+ CRITICAL at 2.6. Will await new orders. Pt still currently does not have an IV site. Labs were gotten and Probation officer got pt to take PO meds. Will continue to encourage pt to be compliant.

## 2019-06-14 ENCOUNTER — Encounter (HOSPITAL_COMMUNITY): Payer: Self-pay | Admitting: Certified Registered Nurse Anesthetist

## 2019-06-14 LAB — CBC WITH DIFFERENTIAL/PLATELET
Abs Immature Granulocytes: 0.34 10*3/uL — ABNORMAL HIGH (ref 0.00–0.07)
Basophils Absolute: 0 10*3/uL (ref 0.0–0.1)
Basophils Relative: 0 %
Eosinophils Absolute: 0 10*3/uL (ref 0.0–0.5)
Eosinophils Relative: 0 %
HCT: 24.8 % — ABNORMAL LOW (ref 36.0–46.0)
Hemoglobin: 7.7 g/dL — ABNORMAL LOW (ref 12.0–15.0)
Immature Granulocytes: 2 %
Lymphocytes Relative: 14 %
Lymphs Abs: 2.5 10*3/uL (ref 0.7–4.0)
MCH: 26.6 pg (ref 26.0–34.0)
MCHC: 31 g/dL (ref 30.0–36.0)
MCV: 85.5 fL (ref 80.0–100.0)
Monocytes Absolute: 0.7 10*3/uL (ref 0.1–1.0)
Monocytes Relative: 4 %
Neutro Abs: 14.2 10*3/uL — ABNORMAL HIGH (ref 1.7–7.7)
Neutrophils Relative %: 80 %
Platelets: 188 10*3/uL (ref 150–400)
RBC: 2.9 MIL/uL — ABNORMAL LOW (ref 3.87–5.11)
RDW: 15.8 % — ABNORMAL HIGH (ref 11.5–15.5)
WBC: 17.9 10*3/uL — ABNORMAL HIGH (ref 4.0–10.5)
nRBC: 0.1 % (ref 0.0–0.2)

## 2019-06-14 LAB — BODY FLUID CULTURE: Culture: NO GROWTH

## 2019-06-14 LAB — HEPATITIS PANEL, ACUTE
HCV Ab: REACTIVE — AB
Hep A IgM: NONREACTIVE
Hep B C IgM: NONREACTIVE
Hepatitis B Surface Ag: NONREACTIVE

## 2019-06-14 LAB — BASIC METABOLIC PANEL
Anion gap: 9 (ref 5–15)
BUN: 9 mg/dL (ref 6–20)
CO2: 23 mmol/L (ref 22–32)
Calcium: 7.1 mg/dL — ABNORMAL LOW (ref 8.9–10.3)
Chloride: 105 mmol/L (ref 98–111)
Creatinine, Ser: 0.52 mg/dL (ref 0.44–1.00)
GFR calc Af Amer: >60 mL/min (ref >60)
GFR calc non Af Amer: >60 mL/min (ref >60)
Glucose, Bld: 163 mg/dL — ABNORMAL HIGH (ref 70–99)
Potassium: 2.6 mmol/L — CL (ref 3.5–5.1)
Sodium: 137 mmol/L (ref 135–145)

## 2019-06-14 LAB — C-REACTIVE PROTEIN: CRP: 16.8 mg/dL — ABNORMAL HIGH (ref <1.0)

## 2019-06-14 LAB — SEDIMENTATION RATE: Sed Rate: 63 mm/hr — ABNORMAL HIGH (ref 0–22)

## 2019-06-14 MED ORDER — METHADONE HCL 10 MG PO TABS
10.0000 mg | ORAL_TABLET | Freq: Once | ORAL | Status: AC
  Administered 2019-06-15: 10 mg via ORAL
  Filled 2019-06-14: qty 1

## 2019-06-14 MED ORDER — LIP MEDEX EX OINT: TOPICAL_OINTMENT | CUTANEOUS | Status: DC | PRN

## 2019-06-14 MED ORDER — QUETIAPINE FUMARATE 100 MG PO TABS
100.0000 mg | ORAL_TABLET | Freq: Once | ORAL | Status: AC
  Administered 2019-06-14: 100 mg via ORAL
  Filled 2019-06-14: qty 1

## 2019-06-14 MED ORDER — POTASSIUM CHLORIDE CRYS ER 20 MEQ PO TBCR
40.0000 meq | EXTENDED_RELEASE_TABLET | ORAL | Status: AC
  Administered 2019-06-14 (×2): 40 meq via ORAL
  Filled 2019-06-14 (×2): qty 2

## 2019-06-14 MED ORDER — POTASSIUM CHLORIDE 10 MEQ/100ML IV SOLN
10.0000 meq | INTRAVENOUS | Status: AC
  Administered 2019-06-14 (×6): 10 meq via INTRAVENOUS
  Filled 2019-06-14 (×2): qty 100

## 2019-06-14 MED ORDER — MORPHINE SULFATE (PF) 4 MG/ML IV SOLN
3.0000 mg | INTRAVENOUS | Status: DC | PRN
  Administered 2019-06-14 – 2019-06-15 (×8): 3 mg via INTRAVENOUS
  Filled 2019-06-14 (×7): qty 1

## 2019-06-14 MED ORDER — LIP MEDEX EX OINT
TOPICAL_OINTMENT | CUTANEOUS | Status: AC
  Filled 2019-06-14: qty 7

## 2019-06-14 MED ORDER — NICOTINE 21 MG/24HR TD PT24
21.0000 mg | MEDICATED_PATCH | Freq: Every day | TRANSDERMAL | Status: DC
  Administered 2019-06-14 – 2019-06-15 (×2): 21 mg via TRANSDERMAL
  Filled 2019-06-14 (×2): qty 1

## 2019-06-14 MED ORDER — ACETAMINOPHEN 325 MG PO TABS
650.0000 mg | ORAL_TABLET | Freq: Four times a day (QID) | ORAL | Status: DC | PRN
  Administered 2019-06-14: 650 mg via ORAL
  Filled 2019-06-14: qty 2

## 2019-06-14 NOTE — Progress Notes (Addendum)
[place], [place] notified me about red mews status. Reassessed patient and vs and patient is now in the yellow MEWS. Patient A&O x 4.  Lung sounds clear. HR tachy.  Called charge nurse Katha Cabal, RN.   VS at 1421 99.2 101/61 MAP 73 HR 107 RR 21 o2 100% RA.   Charge Nurse Katha Cabal stated that she believes this is anxiety related. Messaged Dr. Tawanna Solo for tylenol as patient temp is increasing.

## 2019-06-14 NOTE — Progress Notes (Signed)
CRITICAL VALUE STICKER  CRITICAL VALUE: Potassium 2.6  RECEIVER (on-site recipient of call): Garlon Hatchet, LPN  DATE & TIME NOTIFIED: 06/14/2019 0900   MD NOTIFIED: Dr. Tawanna Solo    TIME OF NOTIFICATION: 0906 by [facility] page.

## 2019-06-14 NOTE — Progress Notes (Signed)
[facility] has been notified re: transport for TEE @ 11am on Monday 4/12 at [facility]

## 2019-06-14 NOTE — Progress Notes (Addendum)
PHARMACY - PHYSICIAN COMMUNICATION CRITICAL VALUE ALERT - BLOOD CULTURE IDENTIFICATION (BCID)  Michelle Doyle is an 29 y.o. female who presented to [facility] on 06/10/2019 with a chief complaint of withdrawal, N/V, fever  Assessment: MSSA Bacteremia 2nd IVDU.  Repeat blood cx obtained 4/10 still growing GPCC.  ID on board.   Name of physician (or Provider) Contacted: Adhikari  Current antibiotics: Nafcillin  Changes to prescribed antibiotics recommended:  none  Results for orders placed or performed during the hospital encounter of 06/10/19  Blood Culture ID Panel (Reflexed) (Collected: 06/11/2019 12:10 AM)  Result Value Ref Range   Enterococcus species NOT DETECTED NOT DETECTED   Listeria monocytogenes NOT DETECTED NOT DETECTED   Staphylococcus species DETECTED (A) NOT DETECTED   Staphylococcus aureus (BCID) DETECTED (A) NOT DETECTED   Methicillin resistance NOT DETECTED NOT DETECTED   Streptococcus species NOT DETECTED NOT DETECTED   Streptococcus agalactiae NOT DETECTED NOT DETECTED   Streptococcus pneumoniae NOT DETECTED NOT DETECTED   Streptococcus pyogenes NOT DETECTED NOT DETECTED   Acinetobacter baumannii NOT DETECTED NOT DETECTED   Enterobacteriaceae species NOT DETECTED NOT DETECTED   Enterobacter cloacae complex NOT DETECTED NOT DETECTED   Escherichia coli NOT DETECTED NOT DETECTED   Klebsiella oxytoca NOT DETECTED NOT DETECTED   Klebsiella pneumoniae NOT DETECTED NOT DETECTED   Proteus species NOT DETECTED NOT DETECTED   Serratia marcescens NOT DETECTED NOT DETECTED   Haemophilus influenzae NOT DETECTED NOT DETECTED   Neisseria meningitidis NOT DETECTED NOT DETECTED   Pseudomonas aeruginosa NOT DETECTED NOT DETECTED   Candida albicans NOT DETECTED NOT DETECTED   Candida glabrata NOT DETECTED NOT DETECTED   Candida krusei NOT DETECTED NOT DETECTED   Candida parapsilosis NOT DETECTED NOT DETECTED   Candida tropicalis NOT DETECTED NOT DETECTED   Netta Cedars,  PharmD, BCPS 06/14/2019 12:01 PM

## 2019-06-14 NOTE — Progress Notes (Addendum)
PROGRESS NOTE    Michelle Doyle  XQJ:[id number] DOB: [birth year] DOA: 06/10/2019 PCP: Patient, No Pcp Per   Brief Narrative:  29 year old female with history of IV heroin use, untreated hepatitis C who was initially admitted for acute heroin withdrawal and sepsis.  CT imaging showed showed pulmonary emboli, splenic infarction.  She was suspected to be in septic shock on presentation and was admitted under PCCM service for vasopressors but she responded to IV fluid.  Currently on broad-spectrum antibiotics.  She also has left knee effusion consistent with septic arthritis.  Orthopedics doing open I&D of the left knee.  ID following for MSSA bacteremia. Hospital course remarkable for noncompliance, refusing blood works.   Assessment & Plan:   Active Problems:   Endocarditis   Sepsis ([place])   MSSA bacteremia: ID following.  Currently on nafcillin.  Still has significant leukocytosis.  Afebrile, hemodynamically stable.  Plan for 6 weeks of IV antibiotics.  Repeat blood culture sent. ID recommended to get MRI of the brain to rule out septic emboli.  Will get MRI when her pain is better so that she can sit on the MRI table.  Endocarditis: Echocardiogram showed ejection fraction of 60-65%, normal left ventricular function. Definitive 1.1 x1.7  tricuspid valve vegetation, possible mitral valve vegetation.  We have requested for TEE.Plan for tomorrow  Left knee septic arthritis: Status post open I&D of the left knee.  Orthopedics following.  Continue pain management, supportive care.  Septic emboli/splenic infarcts: Associated with sepsis.  Currently hemodynamically stable.  Opiate abuse: Uses IV heroin.  On Percocet/morphine for pain.  Monitor for withdrawal.  Elevated ESR/CRP.  Positive Hep  C: We will check HCV RNA.Marland Kitchen  Elevated troponin: Likely from  endocarditis.  Denies any chest pain.  Thrombocytopenia: Continue to monitor  Homelessness:  SW following.  Severe hypokalemia: Patient  refused potassium tablets.  I have encouraged her to be compliant with our recommendation.  Ordered potassium again.  Smoker: Smokes 2 packs a day.  Continue nicotine patch      DVT prophylaxis:Loevnox Code Status: Full Family Communication: None Status is: Inpatient  Remains inpatient appropriate because:Hemodynamically unstable and IV treatments appropriate due to intensity of illness or inability to take PO   Dispo: The patient is from: Home              Anticipated d/c is to: Home              Anticipated d/c date is: > 3 days              Patient currently is not medically stable to d/c.         Consultants: ID, PCCM  Procedures: I&D of the left knee.  Antimicrobials:  Anti-infectives (From admission, onward)    Start     Dose/Rate Route Frequency Ordered Stop   06/12/19 1400  nafcillin 2 g in sodium chloride 0.9 % 100 mL IVPB     2 g 200 mL/hr over 30 Minutes Intravenous Every 4 hours 06/12/19 1303     06/12/19 1300  nafcillin injection 2 g  Status:  Discontinued     2 g Intravenous Every 4 hours 06/12/19 1258 06/12/19 1302   06/11/19 1645  ceFAZolin (ANCEF) IVPB 2g/100 mL premix  Status:  Discontinued     2 g 200 mL/hr over 30 Minutes Intravenous On call to O.R. 06/11/19 1626 06/11/19 1955   06/11/19 1600  ceFAZolin (ANCEF) IVPB 2g/100 mL premix  Status:  Discontinued  2 g 200 mL/hr over 30 Minutes Intravenous Every 8 hours 06/11/19 1514 06/12/19 1258   06/11/19 1200  vancomycin (VANCOREADY) IVPB 750 mg/150 mL  Status:  Discontinued     750 mg 150 mL/hr over 60 Minutes Intravenous Every 12 hours 06/11/19 0642 06/11/19 1514   06/11/19 0630  ceFEPIme (MAXIPIME) 2 g in sodium chloride 0.9 % 100 mL IVPB  Status:  Discontinued     2 g 200 mL/hr over 30 Minutes Intravenous Every 8 hours 06/11/19 0622 06/11/19 1514   06/11/19 0615  vancomycin (VANCOCIN) IVPB 1000 mg/200 mL premix  Status:  Discontinued    Note to Pharmacy: pharmacy to dose   1,000 mg 200  mL/hr over 60 Minutes Intravenous Every 24 hours 06/11/19 0607 06/11/19 0621   06/11/19 0615  ceFEPIme (MAXIPIME) 1 g in sodium chloride 0.9 % 100 mL IVPB  Status:  Discontinued     1 g 200 mL/hr over 30 Minutes Intravenous Every 8 hours 06/11/19 0607 06/11/19 0622   06/11/19 0030  vancomycin (VANCOCIN) IVPB 1000 mg/200 mL premix     1,000 mg 200 mL/hr over 60 Minutes Intravenous  Once 06/11/19 0015 06/11/19 0238   06/11/19 0030  ceFEPIme (MAXIPIME) 2 g in sodium chloride 0.9 % 100 mL IVPB     2 g 200 mL/hr over 30 Minutes Intravenous  Once 06/11/19 0015 06/11/19 0121       Subjective: Patient seen and examined at the bedside.  Continues to complain of severe pain on the left knee and asked me to increase the pain medications.  She was refusing blood works, potassium tablets.  Had long discussion with the bedside about the importance of being compliant with medical treatment team.  Objective: Vitals:   06/13/19 0600 06/13/19 1407 06/13/19 2056 06/14/19 0615  BP: 106/66 98/62 102/72 105/68  Pulse: 82 74 77 97  Resp: '16 20 18 16  ' Temp: 98.4 F (36.9 C) 99.4 F (37.4 C) 98.9 F (37.2 C) 99.2 F (37.3 C)  TempSrc: Oral Oral Oral Oral  SpO2: 98% 96% 97% 100%  Weight:      Height:        Intake/Output Summary (Last 24 hours) at 06/14/2019 0838 Last data filed at 06/13/2019 2143 Gross per 24 hour  Intake --  Output 600 ml  Net -600 ml   Filed Weights   06/10/19 2344  Weight: 49.9 kg    Examination:  General exam: Irritable, complains of pain  Respiratory system: Bilateral equal air entry, normal vesicular breath sounds, no wheezes or crackles  Cardiovascular system: S1 & S2 heard, RRR. No JVD, murmurs, rubs, gallops or clicks. Gastrointestinal system: Abdomen is nondistended, soft and nontender. No organomegaly or masses felt. Normal bowel sounds heard. Central nervous system: Alert and oriented. No focal neurological deficits. Extremities: No edema, no clubbing ,no  cyanosis, left knee wrapped with dressing  skin: needle marks on the legs   Data Reviewed: I have personally reviewed following labs and imaging studies  CBC: Recent Labs  Lab 06/11/19 0005 06/12/19 0605 06/13/19 1153  WBC 24.6* 17.3* 17.3*  NEUTROABS 21.1*  --  14.4*  HGB 9.4* 8.7* 9.0*  HCT 27.7* 26.3* 27.1*  MCV 78.2* 79.7* 78.3*  PLT 135* 105* 517   Basic Metabolic Panel: Recent Labs  Lab 06/11/19 0005 06/12/19 0849 06/13/19 1153  NA 127* 138 141  K 3.2* 3.3* 2.6*  CL 89* 105 108  CO2 '25 24 24  ' GLUCOSE 109* 227* 88  BUN 27*  22* 17  CREATININE 0.76 0.70 0.66  CALCIUM 7.8* 7.4* 7.6*  MG  --   --  2.5*   GFR: Estimated Creatinine Clearance: 83.9 mL/min (by C-G formula based on SCr of 0.66 mg/dL). Liver Function Tests: Recent Labs  Lab 06/11/19 0005  AST 162*  ALT 47*  ALKPHOS 139*  BILITOT 1.3*  PROT 7.6  ALBUMIN 2.0*   No results for input(s): LIPASE, AMYLASE in the last 168 hours. No results for input(s): AMMONIA in the last 168 hours. Coagulation Profile: Recent Labs  Lab 06/11/19 0005  INR 1.2   Cardiac Enzymes: Recent Labs  Lab 06/11/19 0232  CKTOTAL 146   BNP (last 3 results) No results for input(s): PROBNP in the last 8760 hours. HbA1C: No results for input(s): HGBA1C in the last 72 hours. CBG: No results for input(s): GLUCAP in the last 168 hours. Lipid Profile: No results for input(s): CHOL, HDL, LDLCALC, TRIG, CHOLHDL, LDLDIRECT in the last 72 hours. Thyroid Function Tests: No results for input(s): TSH, T4TOTAL, FREET4, T3FREE, THYROIDAB in the last 72 hours. Anemia Panel: No results for input(s): VITAMINB12, FOLATE, FERRITIN, TIBC, IRON, RETICCTPCT in the last 72 hours. Sepsis Labs: Recent Labs  Lab 06/11/19 0005 06/11/19 0232 06/12/19 0849 06/13/19 1153  LATICACIDVEN 2.6* 1.9 3.0* 2.2*    Recent Results (from the past 240 hour(s))  Culture, blood (Routine x 2)     Status: Abnormal   Collection Time: 06/11/19 12:05 AM     Specimen: BLOOD  Result Value Ref Range Status   Specimen Description   Final    BLOOD BLOOD LEFT HAND Performed at [facility] [address], [place], [place] [address]    Special Requests   Final    BOTTLES DRAWN AEROBIC ONLY Blood Culture adequate volume Performed at [facility] [address], [place], [place] [address]    Culture  Setup Time   Final    GRAM POSITIVE COCCI IN CLUSTERS AEROBIC BOTTLE ONLY CRITICAL VALUE NOTED.  VALUE IS CONSISTENT WITH PREVIOUSLY REPORTED AND CALLED VALUE.    Culture (A)  Final    STAPHYLOCOCCUS AUREUS SUSCEPTIBILITIES PERFORMED ON PREVIOUS CULTURE WITHIN THE LAST 5 DAYS. Performed at [facility], [place] [address], [place], [place] [address]    Report Status 06/13/2019 FINAL  Final  Urine culture     Status: Abnormal   Collection Time: 06/11/19 12:05 AM   Specimen: In/Out Cath Urine  Result Value Ref Range Status   Specimen Description   Final    IN/OUT CATH URINE Performed at [facility] [address], [place], [place] [address]    Special Requests   Final    NONE Performed at [facility], [place] [address] [place], [place] [address]    Culture (A)  Final    >=100,000 COLONIES/mL KOCURIA SPECIES 10,000 COLONIES/mL STAPHYLOCOCCUS AUREUS Standardized susceptibility testing for this organism is not available. FOR KOCURIA SPECIES Performed at [facility], [place] [address], [place], [place] [address]    Report Status 06/13/2019 FINAL  Final   Organism ID, Bacteria STAPHYLOCOCCUS AUREUS (A)  Final      Susceptibility   Staphylococcus aureus - MIC*    CIPROFLOXACIN >=8 RESISTANT Resistant     GENTAMICIN <=0.5 SENSITIVE Sensitive     NITROFURANTOIN <=16 SENSITIVE Sensitive     OXACILLIN 0.5 SENSITIVE Sensitive     TETRACYCLINE <=1 SENSITIVE Sensitive     VANCOMYCIN 1 SENSITIVE Sensitive  TRIMETH/SULFA <=10 SENSITIVE Sensitive      CLINDAMYCIN <=0.25 SENSITIVE Sensitive     RIFAMPIN <=0.5 SENSITIVE Sensitive     Inducible Clindamycin NEGATIVE Sensitive     * 10,000 COLONIES/mL STAPHYLOCOCCUS AUREUS  Culture, blood (Routine x 2)     Status: Abnormal   Collection Time: 06/11/19 12:10 AM   Specimen: BLOOD  Result Value Ref Range Status   Specimen Description   Final    BLOOD BLOOD RIGHT FOREARM Performed at [facility] [address], [place], [place] [address]    Special Requests   Final    BOTTLES DRAWN AEROBIC ONLY Blood Culture adequate volume Performed at [facility] [address], [place], [place] [address]    Culture  Setup Time   Final    GRAM POSITIVE COCCI IN CLUSTERS AEROBIC BOTTLE ONLY CRITICAL RESULT CALLED TO, READ BACK BY AND VERIFIED WITH: [place] [phone] FCP Performed at [facility], [place] [address], [place], [place] [address]    Culture STAPHYLOCOCCUS AUREUS (A)  Final   Report Status 06/13/2019 FINAL  Final   Organism ID, Bacteria STAPHYLOCOCCUS AUREUS  Final      Susceptibility   Staphylococcus aureus - MIC*    CIPROFLOXACIN >=8 RESISTANT Resistant     ERYTHROMYCIN >=8 RESISTANT Resistant     GENTAMICIN <=0.5 SENSITIVE Sensitive     OXACILLIN 0.5 SENSITIVE Sensitive     TETRACYCLINE <=1 SENSITIVE Sensitive     VANCOMYCIN 1 SENSITIVE Sensitive     TRIMETH/SULFA <=10 SENSITIVE Sensitive     CLINDAMYCIN <=0.25 SENSITIVE Sensitive     RIFAMPIN <=0.5 SENSITIVE Sensitive     Inducible Clindamycin NEGATIVE Sensitive     * STAPHYLOCOCCUS AUREUS  Blood Culture ID Panel (Reflexed)     Status: Abnormal   Collection Time: 06/11/19 12:10 AM  Result Value Ref Range Status   Enterococcus species NOT DETECTED NOT DETECTED Final   Listeria monocytogenes NOT DETECTED NOT DETECTED Final   Staphylococcus species DETECTED (A) NOT DETECTED Final    Comment: CRITICAL RESULT CALLED TO, READ BACK BY AND VERIFIED WITH: PHARMD MICHELLE B. [phone]  [id number] FCP    Staphylococcus aureus (BCID) DETECTED (A) NOT DETECTED Final    Comment: Methicillin (oxacillin) susceptible Staphylococcus aureus (MSSA). Preferred therapy is anti staphylococcal beta lactam antibiotic (Cefazolin or Nafcillin), unless clinically contraindicated. CRITICAL RESULT CALLED TO, READ BACK BY AND VERIFIED WITH: PHARMD MICHELLE B. [phone] FCP    Methicillin resistance NOT DETECTED NOT DETECTED Final   Streptococcus species NOT DETECTED NOT DETECTED Final   Streptococcus agalactiae NOT DETECTED NOT DETECTED Final   Streptococcus pneumoniae NOT DETECTED NOT DETECTED Final   Streptococcus pyogenes NOT DETECTED NOT DETECTED Final   Acinetobacter baumannii NOT DETECTED NOT DETECTED Final   Enterobacteriaceae species NOT DETECTED NOT DETECTED Final   Enterobacter cloacae complex NOT DETECTED NOT DETECTED Final   Escherichia coli NOT DETECTED NOT DETECTED Final   Klebsiella oxytoca NOT DETECTED NOT DETECTED Final   Klebsiella pneumoniae NOT DETECTED NOT DETECTED Final   Proteus species NOT DETECTED NOT DETECTED Final   Serratia marcescens NOT DETECTED NOT DETECTED Final   Haemophilus influenzae NOT DETECTED NOT DETECTED Final   Neisseria meningitidis NOT DETECTED NOT DETECTED Final   Pseudomonas aeruginosa NOT DETECTED NOT DETECTED Final   Candida albicans NOT DETECTED NOT DETECTED Final   Candida glabrata NOT DETECTED NOT DETECTED Final   Candida krusei NOT DETECTED NOT DETECTED Final   Candida  parapsilosis NOT DETECTED NOT DETECTED Final   Candida tropicalis NOT DETECTED NOT DETECTED Final    Comment: Performed at [facility], [place] [address], [place], [place] [address]  SARS CORONAVIRUS 2 (TAT 6-24 HRS) Nasopharyngeal Nasopharyngeal Swab     Status: None   Collection Time: 06/11/19 12:52 AM   Specimen: Nasopharyngeal Swab  Result Value Ref Range Status   SARS Coronavirus 2 NEGATIVE NEGATIVE Final    Comment: (NOTE) SARS-CoV-2 target nucleic acids are  NOT DETECTED. The SARS-CoV-2 RNA is generally detectable in upper and lower respiratory specimens during the acute phase of infection. Negative results do not preclude SARS-CoV-2 infection, do not rule out co-infections with other pathogens, and should not be used as the sole basis for treatment or other patient management decisions. Negative results must be combined with clinical observations, patient history, and epidemiological information. The expected result is Negative. Fact Sheet for Patients: SugarRoll.be Fact Sheet for Healthcare Providers: https://www.woods-mathews.com/ This test is not yet approved or cleared by the [place] FDA and  has been authorized for detection and/or diagnosis of SARS-CoV-2 by FDA under an Emergency Use Authorization (EUA). This EUA will remain  in effect (meaning this test can be used) for the duration of the COVID-19 declaration under Section 56 4(b)(1) of the Act, 21 U.S.C. section 360bbb-3(b)(1), unless the authorization is terminated or revoked sooner. Performed at [facility], [place] [address], [place], [place] [address]   Respiratory Panel by RT PCR (Flu A&B, Covid) - Nasopharyngeal Swab     Status: None   Collection Time: 06/11/19  5:25 AM   Specimen: Nasopharyngeal Swab  Result Value Ref Range Status   SARS Coronavirus 2 by RT PCR NEGATIVE NEGATIVE Final    Comment: (NOTE) SARS-CoV-2 target nucleic acids are NOT DETECTED. The SARS-CoV-2 RNA is generally detectable in upper respiratoy specimens during the acute phase of infection. The lowest concentration of SARS-CoV-2 viral copies this assay can detect is 131 copies/mL. A negative result does not preclude SARS-Cov-2 infection and should not be used as the sole basis for treatment or other patient management decisions. A negative result may occur with  improper specimen collection/handling, submission of specimen other than nasopharyngeal  swab, presence of viral mutation(s) within the areas targeted by this assay, and inadequate number of viral copies (<131 copies/mL). A negative result must be combined with clinical observations, patient history, and epidemiological information. The expected result is Negative. Fact Sheet for Patients:  PinkCheek.be Fact Sheet for Healthcare Providers:  GravelBags.it This test is not yet ap proved or cleared by the [place] FDA and  has been authorized for detection and/or diagnosis of SARS-CoV-2 by FDA under an Emergency Use Authorization (EUA). This EUA will remain  in effect (meaning this test can be used) for the duration of the COVID-19 declaration under Section 564(b)(1) of the Act, 21 U.S.C. section 360bbb-3(b)(1), unless the authorization is terminated or revoked sooner.    Influenza A by PCR NEGATIVE NEGATIVE Final   Influenza B by PCR NEGATIVE NEGATIVE Final    Comment: (NOTE) The Xpert Xpress SARS-CoV-2/FLU/RSV assay is intended as an aid in  the diagnosis of influenza from Nasopharyngeal swab specimens and  should not be used as a sole basis for treatment. Nasal washings and  aspirates are unacceptable for Xpert Xpress SARS-CoV-2/FLU/RSV  testing. Fact Sheet for Patients: PinkCheek.be Fact Sheet for Healthcare Providers: GravelBags.it This test is not yet approved or cleared by the [place] FDA and  has  been authorized for detection and/or diagnosis of SARS-CoV-2 by  FDA under an Emergency Use Authorization (EUA). This EUA will remain  in effect (meaning this test can be used) for the duration of the  Covid-19 declaration under Section 564(b)(1) of the Act, 21  U.S.C. section 360bbb-3(b)(1), unless the authorization is  terminated or revoked. Performed at [facility], [place] [address] [place], [place] [address]   Body fluid  culture     Status: None (Preliminary result)   Collection Time: 06/11/19  2:03 PM   Specimen: Synovium; Synovial Fluid  Result Value Ref Range Status   Specimen Description SYNOVIAL FLUID KNEE LEFT  Final   Special Requests   Final    NONE Performed at [facility] [address] [place], [place] [address]    Gram Stain   Final    ABUNDANT WBC PRESENT,BOTH PMN AND MONONUCLEAR NO ORGANISMS SEEN Gram Stain Report Called to,Read Back By and Verified With: DR. Lyla Glassing AT 1512 ON 06/11/19 BY N.THOMPSON Performed at [facility], [place] [address] [place], [place] [address]    Culture   Final    NO GROWTH 2 DAYS Performed at [facility] [address], [place], [place] [address]    Report Status PENDING  Incomplete  Anaerobic culture     Status: None (Preliminary result)   Collection Time: 06/11/19  2:03 PM   Specimen: Synovium; Synovial Fluid  Result Value Ref Range Status   Specimen Description SYNOVIAL FLUID KNEE LEFT  Final   Special Requests   Final    NONE Performed at [facility] [address], [place], [place] [address]    Culture   Final    NO ANAEROBES ISOLATED; CULTURE IN PROGRESS FOR 5 DAYS   Report Status PENDING  Incomplete  Aerobic/Anaerobic Culture (surgical/deep wound)     Status: None (Preliminary result)   Collection Time: 06/11/19  6:30 PM   Specimen: Synovial, Left Knee; Body Fluid  Result Value Ref Range Status   Specimen Description   Final    SYNOVIAL KNEE LEFT Performed at [facility] [address], [place], [place] [address]    Special Requests   Final    NONE Performed at [facility], [place] [address], [place], [place] [address]    Gram Stain   Final    RARE WBC PRESENT, PREDOMINANTLY PMN NO ORGANISMS SEEN Performed at [facility], [place] [address] [place], [place] [address]    Culture   Final    RARE STAPHYLOCOCCUS AUREUS NO  ANAEROBES ISOLATED; CULTURE IN PROGRESS FOR 5 DAYS    Report Status PENDING  Incomplete  Aerobic/Anaerobic Culture (surgical/deep wound)     Status: None (Preliminary result)   Collection Time: 06/11/19  6:32 PM   Specimen: Synovium; Tissue  Result Value Ref Range Status   Specimen Description   Final    SYNOVIAL TISSUE KNEE LEFT Performed at [facility] [address], [place], [place] [address]    Special Requests   Final    NONE Performed at [facility] [place], [place] [address] [place], [place] [address]    Gram Stain NO WBC SEEN NO ORGANISMS SEEN   Final   Culture   Final    RARE STAPHYLOCOCCUS AUREUS CULTURE REINCUBATED FOR BETTER GROWTH Performed at [facility], [place] [address], [place], [place] [address]    Report Status PENDING  Incomplete  Surgical PCR screen     Status: Abnormal   Collection Time: 06/11/19  8:49 PM   Specimen: Nasal Mucosa; Nasal Swab  Result Value Ref Range Status   MRSA, PCR NEGATIVE NEGATIVE Final   Staphylococcus aureus POSITIVE (A) NEGATIVE Final    Comment: (NOTE) The Xpert SA Assay (FDA approved for NASAL specimens in patients 84 years of age and older), is one component of a comprehensive surveillance program. It is not intended to diagnose infection nor to guide or monitor treatment. Performed at [facility], [place] [address] [place], [place] [address]          Radiology Studies: No results found.      Scheduled Meds:  docusate sodium  100 mg Oral BID   famotidine  20 mg Oral BID   mupirocin ointment  1 application Nasal BID   Continuous Infusions:  nafcillin IV 2 g (06/14/19 0738)     LOS: 3 days    Time spent: 35 mins.More than 50% of that time was spent in counseling and/or coordination of care.      Shelly Coss, MD [facility] P4/01/2020, 8:38 AM

## 2019-06-14 NOTE — Progress Notes (Signed)
Subjective: 3 Days Post-Op Procedure(s) (LRB): IRRIGATION AND DEBRIDEMENT KNEE (Left) Patient reports pain as severe.  Reports pain everywhere including knee. Different pain than pre-op.  Objective: Vital signs in last 24 hours: Temp:  [98.9 F (37.2 C)-99.4 F (37.4 C)] 99.2 F (37.3 C) (04/11 0615) Pulse Rate:  [74-97] 97 (04/11 0615) Resp:  [16-20] 16 (04/11 0615) BP: (98-105)/(62-72) 105/68 (04/11 0615) SpO2:  [96 %-100 %] 100 % (04/11 0615)  Intake/Output from previous day: 04/10 0701 - 04/11 0700 In: -  Out: 600 [Urine:600] Intake/Output this shift: No intake/output data recorded.  Recent Labs    06/12/19 0605 06/13/19 1153 06/14/19 0838  HGB 8.7* 9.0* 7.7*   Recent Labs    06/13/19 1153 06/14/19 0838  WBC 17.3* 17.9*  RBC 3.46* 2.90*  HCT 27.1* 24.8*  PLT 183 188   Recent Labs    06/13/19 1153 06/14/19 0838  NA 141 137  K 2.6* 2.6*  CL 108 105  CO2 24 23  BUN 17 9  CREATININE 0.66 0.52  GLUCOSE 88 163*  CALCIUM 7.6* 7.1*   No results for input(s): LABPT, INR in the last 72 hours.  Neurologically intact ABD soft Neurovascular intact Sensation intact distally Intact pulses distally Dorsiflexion/Plantar flexion intact Incision: dressing C/D/I and no drainage No cellulitis present Compartment soft   Assessment/Plan: 3 Days Post-Op Procedure(s) (LRB): IRRIGATION AND DEBRIDEMENT KNEE (Left) Advance diet Up with therapy Continue IV abx per ID Withdrawal tx per admitting team Hypokalemia tx per admitting team Stable from ortho standpoint May WBAT with walker  Cecilie Kicks 06/14/2019, 10:20 AM

## 2019-06-15 ENCOUNTER — Other Ambulatory Visit: Payer: Self-pay

## 2019-06-15 ENCOUNTER — Inpatient Hospital Stay (HOSPITAL_COMMUNITY): Payer: Self-pay

## 2019-06-15 ENCOUNTER — Inpatient Hospital Stay (HOSPITAL_COMMUNITY)
Admission: EM | Admit: 2019-06-15 | Discharge: 2019-06-30 | DRG: 289 | Discharge status: Against Medical Advice | Payer: Self-pay | Attending: Internal Medicine | Admitting: Internal Medicine

## 2019-06-15 ENCOUNTER — Encounter (HOSPITAL_COMMUNITY)
Admission: EM | Discharge status: Against Medical Advice | Payer: Self-pay | Source: Home / Self Care | Attending: Internal Medicine

## 2019-06-15 DIAGNOSIS — I071 Rheumatic tricuspid insufficiency: Secondary | ICD-10-CM | POA: Diagnosis present

## 2019-06-15 DIAGNOSIS — R079 Chest pain, unspecified: Secondary | ICD-10-CM | POA: Diagnosis not present

## 2019-06-15 DIAGNOSIS — B1920 Unspecified viral hepatitis C without hepatic coma: Secondary | ICD-10-CM | POA: Diagnosis present

## 2019-06-15 DIAGNOSIS — F1510 Other stimulant abuse, uncomplicated: Secondary | ICD-10-CM | POA: Diagnosis present

## 2019-06-15 DIAGNOSIS — I76 Septic arterial embolism: Secondary | ICD-10-CM | POA: Diagnosis present

## 2019-06-15 DIAGNOSIS — B373 Candidiasis of vulva and vagina: Secondary | ICD-10-CM | POA: Diagnosis not present

## 2019-06-15 DIAGNOSIS — M25512 Pain in left shoulder: Secondary | ICD-10-CM | POA: Diagnosis not present

## 2019-06-15 DIAGNOSIS — F1120 Opioid dependence, uncomplicated: Secondary | ICD-10-CM | POA: Diagnosis present

## 2019-06-15 DIAGNOSIS — F1910 Other psychoactive substance abuse, uncomplicated: Secondary | ICD-10-CM

## 2019-06-15 DIAGNOSIS — F17200 Nicotine dependence, unspecified, uncomplicated: Secondary | ICD-10-CM | POA: Diagnosis present

## 2019-06-15 DIAGNOSIS — I330 Acute and subacute infective endocarditis: Principal | ICD-10-CM | POA: Diagnosis present

## 2019-06-15 DIAGNOSIS — Z5329 Procedure and treatment not carried out because of patient's decision for other reasons: Secondary | ICD-10-CM | POA: Diagnosis present

## 2019-06-15 DIAGNOSIS — Z590 Homelessness: Secondary | ICD-10-CM

## 2019-06-15 DIAGNOSIS — E876 Hypokalemia: Secondary | ICD-10-CM | POA: Diagnosis present

## 2019-06-15 DIAGNOSIS — I748 Embolism and thrombosis of other arteries: Secondary | ICD-10-CM | POA: Diagnosis present

## 2019-06-15 DIAGNOSIS — G8929 Other chronic pain: Secondary | ICD-10-CM | POA: Diagnosis present

## 2019-06-15 DIAGNOSIS — R000 Tachycardia, unspecified: Secondary | ICD-10-CM | POA: Diagnosis not present

## 2019-06-15 DIAGNOSIS — Z09 Encounter for follow-up examination after completed treatment for conditions other than malignant neoplasm: Secondary | ICD-10-CM

## 2019-06-15 DIAGNOSIS — M009 Pyogenic arthritis, unspecified: Secondary | ICD-10-CM

## 2019-06-15 DIAGNOSIS — Z716 Tobacco abuse counseling: Secondary | ICD-10-CM

## 2019-06-15 DIAGNOSIS — D735 Infarction of spleen: Secondary | ICD-10-CM

## 2019-06-15 DIAGNOSIS — B9561 Methicillin susceptible Staphylococcus aureus infection as the cause of diseases classified elsewhere: Secondary | ICD-10-CM | POA: Diagnosis present

## 2019-06-15 DIAGNOSIS — D473 Essential (hemorrhagic) thrombocythemia: Secondary | ICD-10-CM | POA: Diagnosis not present

## 2019-06-15 DIAGNOSIS — M00062 Staphylococcal arthritis, left knee: Secondary | ICD-10-CM | POA: Diagnosis present

## 2019-06-15 DIAGNOSIS — D509 Iron deficiency anemia, unspecified: Secondary | ICD-10-CM | POA: Diagnosis present

## 2019-06-15 LAB — RETICULOCYTES
Immature Retic Fract: 34.4 % — ABNORMAL HIGH (ref 2.3–15.9)
RBC.: 2.46 MIL/uL — ABNORMAL LOW (ref 3.87–5.11)
Retic Count, Absolute: 62.2 10*3/uL (ref 19.0–186.0)
Retic Ct Pct: 2.5 % (ref 0.4–3.1)

## 2019-06-15 LAB — FOLATE: Folate: 18.2 ng/mL (ref >5.9)

## 2019-06-15 LAB — CBC WITH DIFFERENTIAL/PLATELET
Abs Immature Granulocytes: 0.51 10*3/uL — ABNORMAL HIGH (ref 0.00–0.07)
Abs Immature Granulocytes: 0.46 10*3/uL — ABNORMAL HIGH (ref 0.00–0.07)
Basophils Absolute: 0 10*3/uL (ref 0.0–0.1)
Basophils Absolute: 0 10*3/uL (ref 0.0–0.1)
Basophils Relative: 0 %
Basophils Relative: 0 %
Eosinophils Absolute: 0.1 10*3/uL (ref 0.0–0.5)
Eosinophils Absolute: 0.1 10*3/uL (ref 0.0–0.5)
Eosinophils Relative: 1 %
Eosinophils Relative: 0 %
HCT: 18.6 % — ABNORMAL LOW (ref 36.0–46.0)
HCT: 20.9 % — ABNORMAL LOW (ref 36.0–46.0)
Hemoglobin: 6.5 g/dL — CL (ref 12.0–15.0)
Hemoglobin: 6.8 g/dL — CL (ref 12.0–15.0)
Immature Granulocytes: 3 %
Immature Granulocytes: 2 %
Lymphocytes Relative: 15 %
Lymphocytes Relative: 11 %
Lymphs Abs: 2.9 10*3/uL (ref 0.7–4.0)
Lymphs Abs: 2.1 10*3/uL (ref 0.7–4.0)
MCH: 26.5 pg (ref 26.0–34.0)
MCH: 26.2 pg (ref 26.0–34.0)
MCHC: 34.9 g/dL (ref 30.0–36.0)
MCHC: 32.5 g/dL (ref 30.0–36.0)
MCV: 75.9 fL — ABNORMAL LOW (ref 80.0–100.0)
MCV: 80.4 fL (ref 80.0–100.0)
Monocytes Absolute: 0.9 10*3/uL (ref 0.1–1.0)
Monocytes Absolute: 0.6 10*3/uL (ref 0.1–1.0)
Monocytes Relative: 5 %
Monocytes Relative: 3 %
Neutro Abs: 14.4 10*3/uL — ABNORMAL HIGH (ref 1.7–7.7)
Neutro Abs: 16.3 10*3/uL — ABNORMAL HIGH (ref 1.7–7.7)
Neutrophils Relative %: 76 %
Neutrophils Relative %: 84 %
Platelets: 216 10*3/uL (ref 150–400)
Platelets: 232 10*3/uL (ref 150–400)
RBC: 2.45 MIL/uL — ABNORMAL LOW (ref 3.87–5.11)
RBC: 2.6 MIL/uL — ABNORMAL LOW (ref 3.87–5.11)
RDW: 14.9 % (ref 11.5–15.5)
RDW: 15.5 % (ref 11.5–15.5)
WBC: 18.8 10*3/uL — ABNORMAL HIGH (ref 4.0–10.5)
WBC: 19.5 10*3/uL — ABNORMAL HIGH (ref 4.0–10.5)
nRBC: 0.2 % (ref 0.0–0.2)
nRBC: 0.1 % (ref 0.0–0.2)

## 2019-06-15 LAB — BASIC METABOLIC PANEL
Anion gap: 9 (ref 5–15)
Anion gap: 10 (ref 5–15)
BUN: 6 mg/dL (ref 6–20)
BUN: 7 mg/dL (ref 6–20)
CO2: 23 mmol/L (ref 22–32)
CO2: 23 mmol/L (ref 22–32)
Calcium: 7 mg/dL — ABNORMAL LOW (ref 8.9–10.3)
Calcium: 7.1 mg/dL — ABNORMAL LOW (ref 8.9–10.3)
Chloride: 106 mmol/L (ref 98–111)
Chloride: 102 mmol/L (ref 98–111)
Creatinine, Ser: 0.48 mg/dL (ref 0.44–1.00)
Creatinine, Ser: 0.53 mg/dL (ref 0.44–1.00)
GFR calc Af Amer: >60 mL/min (ref >60)
GFR calc Af Amer: >60 mL/min (ref >60)
GFR calc non Af Amer: >60 mL/min (ref >60)
GFR calc non Af Amer: >60 mL/min (ref >60)
Glucose, Bld: 87 mg/dL (ref 70–99)
Glucose, Bld: 153 mg/dL — ABNORMAL HIGH (ref 70–99)
Potassium: 3.7 mmol/L (ref 3.5–5.1)
Potassium: 3 mmol/L — ABNORMAL LOW (ref 3.5–5.1)
Sodium: 138 mmol/L (ref 135–145)
Sodium: 135 mmol/L (ref 135–145)

## 2019-06-15 LAB — IRON AND TIBC
Iron: 17 ug/dL — ABNORMAL LOW (ref 28–170)
Saturation Ratios: 10 % — ABNORMAL LOW (ref 10.4–31.8)
TIBC: 163 ug/dL — ABNORMAL LOW (ref 250–450)
UIBC: 146 ug/dL

## 2019-06-15 LAB — CULTURE, BLOOD (ROUTINE X 2): Special Requests: ADEQUATE

## 2019-06-15 LAB — MAGNESIUM: Magnesium: 2 mg/dL (ref 1.7–2.4)

## 2019-06-15 LAB — LACTIC ACID, PLASMA: Lactic Acid, Venous: 1.8 mmol/L (ref 0.5–1.9)

## 2019-06-15 LAB — VITAMIN B12: Vitamin B-12: 1260 pg/mL — ABNORMAL HIGH (ref 180–914)

## 2019-06-15 LAB — FERRITIN: Ferritin: 217 ng/mL (ref 11–307)

## 2019-06-15 LAB — PREPARE RBC (CROSSMATCH)

## 2019-06-15 LAB — ABO/RH: ABO/RH(D): O POS

## 2019-06-15 SURGERY — CANCELLED PROCEDURE

## 2019-06-15 MED ORDER — NICOTINE 21 MG/24HR TD PT24
21.0000 mg | MEDICATED_PATCH | Freq: Every day | TRANSDERMAL | Status: DC
  Administered 2019-06-16 – 2019-06-30 (×15): 21 mg via TRANSDERMAL
  Filled 2019-06-15 (×15): qty 1

## 2019-06-15 MED ORDER — LACTATED RINGERS IV SOLN
INTRAVENOUS | Status: DC | PRN
  Administered 2019-06-15: 1000 mL via INTRAVENOUS

## 2019-06-15 MED ORDER — SODIUM CHLORIDE 0.9 % IV SOLN: INTRAVENOUS | Status: DC

## 2019-06-15 MED ORDER — OXYCODONE-ACETAMINOPHEN 7.5-325 MG PO TABS
1.0000 | ORAL_TABLET | Freq: Once | ORAL | Status: AC
  Administered 2019-06-15: 15:00:00 1 via ORAL
  Filled 2019-06-15: qty 1

## 2019-06-15 MED ORDER — SODIUM CHLORIDE 0.9% IV SOLUTION: Freq: Once | INTRAVENOUS | Status: DC

## 2019-06-15 MED ORDER — SODIUM CHLORIDE 0.9% IV SOLUTION: Freq: Once | INTRAVENOUS | Status: AC

## 2019-06-15 MED ORDER — OXYCODONE-ACETAMINOPHEN 5-325 MG PO TABS
1.0000 | ORAL_TABLET | ORAL | Status: DC | PRN
  Administered 2019-06-16 – 2019-06-29 (×27): 2 via ORAL
  Administered 2019-06-29: 1 via ORAL
  Administered 2019-06-30 (×2): 2 via ORAL
  Filled 2019-06-15 (×34): qty 2

## 2019-06-15 MED ORDER — ENOXAPARIN SODIUM 40 MG/0.4ML ~~LOC~~ SOLN: 40.0000 mg | SUBCUTANEOUS | Status: DC

## 2019-06-15 MED ORDER — SODIUM CHLORIDE 0.9 % IV SOLN: 10.0000 mL/h | Freq: Once | INTRAVENOUS | Status: DC

## 2019-06-15 MED ORDER — MORPHINE SULFATE (PF) 4 MG/ML IV SOLN
4.0000 mg | Freq: Once | INTRAVENOUS | Status: AC
  Administered 2019-06-15: 4 mg via INTRAVENOUS
  Filled 2019-06-15: qty 1

## 2019-06-15 MED ORDER — SODIUM CHLORIDE 0.9 % IV BOLUS: 500.0000 mL | Freq: Once | INTRAVENOUS | Status: DC

## 2019-06-15 MED ORDER — LOPERAMIDE HCL 2 MG PO CAPS: 4.0000 mg | ORAL_CAPSULE | Freq: Four times a day (QID) | ORAL | Status: DC | PRN

## 2019-06-15 MED ORDER — SODIUM CHLORIDE 0.9 % IV BOLUS
1000.0000 mL | Freq: Once | INTRAVENOUS | Status: AC
  Administered 2019-06-15: 1000 mL via INTRAVENOUS

## 2019-06-15 MED ORDER — MORPHINE SULFATE (PF) 2 MG/ML IV SOLN
INTRAVENOUS | Status: AC
  Filled 2019-06-15: qty 2

## 2019-06-15 MED ORDER — QUETIAPINE FUMARATE 50 MG PO TABS
50.0000 mg | ORAL_TABLET | Freq: Every day | ORAL | Status: AC
  Administered 2019-06-15: 50 mg via ORAL
  Filled 2019-06-15: qty 1

## 2019-06-15 MED ORDER — SACCHAROMYCES BOULARDII 250 MG PO CAPS
250.0000 mg | ORAL_CAPSULE | Freq: Two times a day (BID) | ORAL | Status: DC
  Administered 2019-06-15 – 2019-06-25 (×20): 250 mg via ORAL
  Filled 2019-06-15 (×21): qty 1

## 2019-06-15 MED ORDER — SODIUM CHLORIDE 0.9 % IV SOLN
12.0000 g | INTRAVENOUS | Status: DC
  Administered 2019-06-15 – 2019-06-29 (×15): 12 g via INTRAVENOUS
  Filled 2019-06-15: qty 4000
  Filled 2019-06-15 (×10): qty 12000
  Filled 2019-06-15: qty 4000
  Filled 2019-06-15 (×2): qty 12000
  Filled 2019-06-15: qty 8000
  Filled 2019-06-15: qty 12000

## 2019-06-15 MED ORDER — MORPHINE SULFATE (PF) 2 MG/ML IV SOLN
2.0000 mg | INTRAVENOUS | Status: DC | PRN
  Administered 2019-06-15 – 2019-06-29 (×73): 2 mg via INTRAVENOUS
  Filled 2019-06-15 (×76): qty 1

## 2019-06-15 MED ORDER — METHADONE HCL 10 MG PO TABS
10.0000 mg | ORAL_TABLET | Freq: Once | ORAL | Status: AC
  Administered 2019-06-15: 10 mg via ORAL
  Filled 2019-06-15 (×2): qty 1

## 2019-06-15 MED ORDER — LOPERAMIDE HCL 2 MG PO CAPS
2.0000 mg | ORAL_CAPSULE | Freq: Once | ORAL | Status: AC
  Administered 2019-06-15: 2 mg via ORAL
  Filled 2019-06-15: qty 1

## 2019-06-15 MED ORDER — HYDROXYZINE HCL 25 MG PO TABS
25.0000 mg | ORAL_TABLET | Freq: Once | ORAL | Status: AC
  Administered 2019-06-15: 25 mg via ORAL
  Filled 2019-06-15: qty 1

## 2019-06-15 NOTE — Progress Notes (Signed)
    [facility] has been requested to perform a transesophageal echocardiogram on Michelle Doyle for endocarditis.  After careful review of history and examination, the risks and benefits of transesophageal echocardiogram have been explained including risks of esophageal damage, perforation (1:10,000 risk), bleeding, pharyngeal hematoma as well as other potential complications associated with conscious sedation including aspiration, arrhythmia, respiratory failure and death. Alternatives to treatment were discussed, questions were answered. Patient is willing to proceed.   [place] Ninfa Meeker, PA-C  06/15/2019 8:39 AM

## 2019-06-15 NOTE — Progress Notes (Signed)
RN from [facility] called and stated that patient left hospital AMA. Notified Peggyann Juba charge nurse.

## 2019-06-15 NOTE — Progress Notes (Signed)
Patient anxious and requesting PTA medication methadone 10mg . Notified on-call MD at 2018, new orders given for 1 time dose of medication.

## 2019-06-15 NOTE — ED Triage Notes (Signed)
States she was hospitalized at [facility] to [facility] for a procedure but it was cancelled due to low Hgb 6.5-states she just had left knee surgery-

## 2019-06-15 NOTE — ED Provider Notes (Signed)
[place] DEPT Provider Note   CSN: [id number] Arrival date & time: 06/15/19  1327     History Chief Complaint  Patient presents with  . Abnormal Lab    Michelle Doyle is a 29 y.o. female with a past medical history of continuous opioid abuse, hepatitis C left AMA approximately 3 hours ago who presents back today for readmission. Chart review shows that she was hospitalized here at [facility] and transferred to [facility] for a TEE however while over there signed out AMA.  Her hospital course was significant for septic shock, admitted for vasopressors but responded to IV fluid, on broad-spectrum antibiotics.  She had a open I&D of the left knee and was found to have MSSA bacteremia.  Labs from this morning show her hemoglobin was 6.5.  She reports that when she got over there she was told that her hemoglobin was too low for the TEE causing her to get upset and leave.  She states that she did not use while she was gone, she was standing outside asking for water when her [place] brought her back here for readmission.  She reportedly has splenic infarcts and pulmonary emboli.     HPI     Past Medical History:  Diagnosis Date  . Hepatitis C   . Opiate abuse, continuous [facility])     Patient Active Problem List   Diagnosis Date Noted  . Endocarditis 06/11/2019  . Sepsis ([place]) 06/11/2019    Past Surgical History:  Procedure Laterality Date  . IRRIGATION AND DEBRIDEMENT KNEE Left 06/11/2019   Procedure: IRRIGATION AND DEBRIDEMENT KNEE;  Surgeon: Rod Can, MD;  Location: [facility] ORS;  Service: Orthopedics;  Laterality: Left;     OB History   No obstetric history on file.     No family history on file.  Social History   Tobacco Use  . Smoking status: Current Every Day Smoker  . Smokeless tobacco: Never Used  Substance Use Topics  . Alcohol use: Not Currently  . Drug use: Yes    Types: Amphetamines    Comment: IV drug use daily (april  2021)    Home Medications Prior to Admission medications   Not on File    Allergies    Patient has no known allergies.  Review of Systems   Review of Systems  Constitutional: Positive for chills and fatigue. Negative for fever.  Respiratory: Negative for chest tightness and shortness of breath.   Cardiovascular: Negative for chest pain.  Gastrointestinal: Positive for diarrhea. Negative for abdominal pain, nausea and vomiting.  Musculoskeletal:       Pain in knee  Skin: Negative for color change.  Neurological: Negative for weakness and headaches.    Physical Exam Updated Vital Signs BP (!) 99/54   Pulse (!) 105   Temp 97.7 F (36.5 C) (Oral)   Resp 20   LMP 05/27/2019 Comment: negative HCG quantitative 06/11/19  SpO2 99%   Physical Exam Vitals and nursing note reviewed.  Constitutional:      General: She is not in acute distress.    Appearance: She is well-developed. She is not diaphoretic.  HENT:     Head: Normocephalic and atraumatic.  Eyes:     General: No scleral icterus.       Right eye: No discharge.        Left eye: No discharge.     Conjunctiva/sclera: Conjunctivae normal.  Cardiovascular:     Rate and Rhythm: Regular rhythm. Tachycardia present.  Pulses: Normal pulses.  Pulmonary:     Effort: Pulmonary effort is normal. No respiratory distress.     Breath sounds: Normal breath sounds. No stridor.  Abdominal:     General: There is no distension.     Tenderness: There is no abdominal tenderness.  Musculoskeletal:        General: No deformity.     Cervical back: Normal range of motion and neck supple.     Right lower leg: No edema.     Left lower leg: No edema.     Comments: Left knee is painful with any movement.  Dressing in place on left anterior knee.    Skin:    General: Skin is warm and dry.     Comments: Hands/feet with janeway lesions, osler nodes, and splinter hemorrhages bilaterally.  Neurological:     Mental Status: She is alert.      Cranial Nerves: No cranial nerve deficit.     Motor: No weakness or abnormal muscle tone.  Psychiatric:        Attention and Perception: Attention normal.        Mood and Affect: Mood normal. Affect is tearful.        Behavior: Behavior normal. Behavior is cooperative.        Judgment: Judgment is impulsive.     ED Results / Procedures / Treatments   Labs (all labs ordered are listed, but only abnormal results are displayed) Labs Reviewed  CBC WITH DIFFERENTIAL/PLATELET - Abnormal; Notable for the following components:      Result Value   WBC 19.5 (*)    RBC 2.60 (*)    Hemoglobin 6.8 (*)    HCT 20.9 (*)    Neutro Abs 16.3 (*)    Abs Immature Granulocytes 0.46 (*)    All other components within normal limits  BASIC METABOLIC PANEL  LACTIC ACID, PLASMA  LACTIC ACID, PLASMA  TYPE AND SCREEN  PREPARE RBC (CROSSMATCH)    EKG None  Radiology No results found.  Procedures .Critical Care Performed by: Lorin Glass, PA-C Authorized by: Lorin Glass, PA-C   Critical care provider statement:    Critical care time (minutes):  45   Critical care time was exclusive of:  Separately billable procedures and treating other patients and teaching time   Critical care was necessary to treat or prevent imminent or life-threatening deterioration of the following conditions:  Shock and sepsis   Critical care was time spent personally by me on the following activities:  Discussions with consultants, evaluation of patient's response to treatment, examination of patient, ordering and performing treatments and interventions, ordering and review of laboratory studies, ordering and review of radiographic studies, pulse oximetry, re-evaluation of patient's condition, obtaining history from patient or surrogate and review of old charts   (including critical care time)  Medications Ordered in ED Medications  nafcillin 12 g in sodium chloride 0.9 % 500 mL continuous infusion (12 g  Intravenous New Bag/Given 06/15/19 1651)  0.9 %  sodium chloride infusion ( Intravenous New Bag/Given (Non-Interop) 06/15/19 2020)  oxyCODONE-acetaminophen (PERCOCET) 7.5-325 MG per tablet 1 tablet (1 tablet Oral Given 06/15/19 1450)  loperamide (IMODIUM) capsule 2 mg (2 mg Oral Given 06/15/19 1544)  hydrOXYzine (ATARAX/VISTARIL) tablet 25 mg (25 mg Oral Given 06/15/19 1544)  sodium chloride 0.9 % bolus 1,000 mL (1,000 mLs Intravenous New Bag/Given (Non-Interop) 06/15/19 1548)  0.9 %  sodium chloride infusion (Manually program via Guardrails IV Fluids) ( Intravenous New Bag/Given  06/15/19 2018)  morphine 4 MG/ML injection 4 mg (4 mg Intravenous Given 06/15/19 1714)  1 unit PRBC.    ED Course  I have reviewed the triage vital signs and the nursing notes.  Pertinent labs & imaging results that were available during my care of the patient were reviewed by me and considered in my medical decision making (see chart for details).  Clinical Course as of Jun 14 2124  Mon Jun 15, 2019  1540 As patient has known MSSA Bactermia code sepsis was not called.  She is re-started on her antibiotics and fluids.      [EH]  7860 29 year old female history of IV drug use, hepatitis C, septic emboli, MSSA bacteremia on nafcillin, recent hospitalization who left AMA yesterday from the hospital pending TTE to evaluate for endocarditis, return to the emergency department with fevers chills fatigue.  On exam the patient is mildly tachycardic.  She has mild diffuse abdominal tenderness but no focal findings.  Do not suspect an acute abdomen.  Her lactate is normal here.  Her white blood cell count is 19,000 similar to yesterday.  Hgb 6.8, drop nearly 2 units over the past few days.  She will be transfusion while she is here in the emergency department.  I would hold off on IV blood thinners at this time.  She will need re-admission, and tells me she is willing to stay for her full workup this time.   [MT]    Clinical Course  User Index [EH] Lorin Glass, PA-C [MT] Langston Masker Carola Rhine, MD   MDM Rules/Calculators/A&P                     Patient is a 29 year old woman who presents today for reevaluation after she left AMA.  History obtained from patient and chart review.  She left AMA this morning after she reportedly got frustrated when she was too anemic to have a TEE.  She reports that she now recognizes that is a mistake and wishes to be readmitted for treatment.  Here she is tachycardic and tachypneic.  She has known MSSA bacteremia and does not appear to have missed any doses of her IV antibiotics.   Her blood pressures are soft, however that appears consistent with her baseline during this admission.  Her IV antibiotics were restarted.  As she has remarkably worsened and is only a hospital for about 3 hours repeat blood cultures were not obtained.   Lactic acid is not significantly elevated.  Given that she had blood cultures and was admitted until she had a made about 3 hours prior to arrival, repeat blood cultures were broad-spectrum antibiotics or not clinically indicated at this time.    She is anemic here and has been anemic this morning.  This appears to be a slow trend rather than an acute drop.  1 unit PRBCs was ordered.    This patient was seen as a shared visit with Dr. Langston Masker.   Note: Portions of this report may have been transcribed using voice recognition software. Every effort was made to ensure accuracy; however, inadvertent computerized transcription errors may be present  Final Clinical Impression(s) / ED Diagnoses Final diagnoses:  Bacteremia due to methicillin susceptible Staphylococcus aureus (MSSA)  IV drug abuse ([place])  Acute bacterial endocarditis    Rx / DC Orders ED Discharge Orders    None       Ollen Gross 06/15/19 2203    Wyvonnia Dusky, MD  06/16/19 2034  

## 2019-06-15 NOTE — Progress Notes (Addendum)
Pharmacy Antibiotic Note  Michelle Doyle is a 29 y.o. female admitted on 06/10/2019 & readmitted 06/15/2019  with sepsis.  Pharmacy has been consulted for Nafcillin dosing.  MSSA bacteremia with right-sided and likely left-sided endocarditis with, Right-sided septic embolization to the lungs, Splenic infarct, left septic knee status post open arthrotomy 4/8,  Stigmata of endocarditis with Osler's nodes on hands  Unable to do TEE today b/c Hg 6.5. Cards: "Since Tricuspid valve vegetation was seen on TTE, consider 6 weeks ABX via PICC line" D#5 abx for MSSA bacteremia 2nd IVDU- Repeat BCx 4/10 still + w/ MSSSA. ID rec MRI brain, elbow   Plan: Nafcillin 12 gm IV q24 continuous infusion F/u for negative BCx for abx end date     Temp (24hrs), Avg:99.3 F (37.4 C), Min:97.7 F (36.5 C), Max:100.5 F (38.1 C)  Recent Labs  Lab 06/11/19 0005 06/11/19 0232 06/12/19 0605 06/12/19 0849 06/13/19 1153 06/14/19 0838 06/15/19 0756  WBC 24.6*  --  17.3*  --  17.3* 17.9* 18.8*  CREATININE 0.76  --   --  0.70 0.66 0.52 0.48  LATICACIDVEN 2.6* 1.9  --  3.0* 2.2*  --   --     Estimated Creatinine Clearance: 83.9 mL/min (by C-G formula based on SCr of 0.48 mg/dL).    No Known Allergies  Antimicrobials this admission:  Vancomycin 06/11/2019 >>4/8 Cefepime 06/11/2019 >> 4/8 Cefazolin 4/8>>4/9 4/9 Nafcillin>>  Dose adjustments this admission: 4/12 change Nafcillin 2 gm q4h to 12 gm/24 hrs continuous infusion  Microbiology results: (no micro Hx in Epic) 4/8 BCx:2 MSSA F 4/8 UCx: >100K [place] sp, 10K MSSA 4/8 COVID/Flu neg 4/8 L knee synovial fluid: MSSA 4/8 MRSa PCR Neg, MSSA PCR + 4/8 HIV NR 4/10 BCx x2: MSSA F 4/11 Hep A/B NR, Hep C reactive  Thank you for allowing pharmacy to be a part of this patient's care.  Eudelia Bunch, Pharm.D [phone] 06/15/2019 2:24 PM

## 2019-06-15 NOTE — Progress Notes (Signed)
Patient requesting food this morning, RN discussed in depth with the patient that she is still NPO for her TEE scheduled for this morning. While nursing staff taking vitals this am she stated that if she could not get any food that she would leave the hospital. RN went to patient's room and educated patient about the importance of her scheduled procedure and purpose of remaining NPO until her procedure. Patient verbalizes understanding but request if her procedure could be cancelled or pushed back to a later time so she can eat.  Discussed in depth with the patient that her MD would have to make this decision but the RN would notify the on-call MD.  Patient also requesting pain medication, RN discussed in depth with patient that there are no PRNs available at this time and patient provided with information about the times of next available doses, patient reports understanding and request if she can have another dose of her methadone PTA medication.  RN made patient aware that she would also mention this request when notifying the on-call MD.  MD notified at [phone] and new order given for 1 time dose of methadone 10mg , MD recommends maintaining current NPO status until patient's schedule procedure this morning.  Patient made aware of new pending 1 time order for methadone.  RN will offer 1 time medication to patient when verified by pharmacy.  RN will continue to monitor patient. Patient resting in bed with call bell within reach and bed in lowest position.

## 2019-06-15 NOTE — Progress Notes (Addendum)
PROGRESS NOTE    Taura Lamarre  FFM:[id number] DOB: 1990/05/11 DOA: 06/10/2019 PCP: Patient, No Pcp Per   Brief Narrative:  29 year old female with history of IV heroin use, untreated hepatitis C who was initially admitted for acute heroin withdrawal and sepsis.  CT imaging showed showed pulmonary emboli, splenic infarction.  She was suspected to be in septic shock on presentation and was admitted under PCCM service for vasopressors but she responded to IV fluid.  Currently on broad-spectrum antibiotics.  She also has left knee effusion consistent with septic arthritis.  Orthopedics doing open I&D of the left knee.  ID following for MSSA bacteremia. Hospital course remarkable for noncompliance, refusing blood works.  Plan for TEE today   Assessment & Plan:   Active Problems:   Endocarditis   Sepsis ([place])   MSSA bacteremia: ID following.  Currently on nafcillin.  Still has significant leukocytosis.  Afebrile, hemodynamically stable.  Plan for 6 weeks of IV antibiotics.  Repeat blood culture sent. ID recommended to get MRI of the brain to rule out septic emboli.  Will get MRI when her pain is better so that she can sit on the MRI table.  Endocarditis: Echocardiogram showed ejection fraction of 60-65%, normal left ventricular function. Definitive 1.1 x1.7  tricuspid valve vegetation, possible mitral valve vegetation.  Plan for TEE  Left knee septic arthritis: Status post open I&D of the left knee.  Orthopedics following.  Continue pain management, supportive care.  Septic emboli/splenic infarcts: Associated with sepsis.  Currently hemodynamically stable.  Opiate abuse: Uses IV heroin.  On Percocet/morphine for pain.  Monitor for withdrawal.  Elevated ESR/CRP.  Positive Hep  C: We will check HCV RNA.Marland Kitchen  Elevated troponin: Likely from  endocarditis.  Denies any chest pain.  Thrombocytopenia: Continue to monitor  Homelessness:  SW following.  Severe hypokalemia: Patient refused  potassium tablets.  I have encouraged her to be compliant with our recommendation.  Ordered potassium again.  Smoker: Smokes 2 packs a day.  Continue nicotine patch   Addendum: Patient signed out AMA from endoscopy suite when she was transported there for TEE.  Discussed extensively on the phone that signing out AMA could be very dangerous action/potentially fatal.  Patient signed out AMA regardless.      DVT prophylaxis:Loevnox Code Status: Full Family Communication: None Status is: Inpatient  Remains inpatient appropriate because:Hemodynamically unstable and IV treatments appropriate due to intensity of illness or inability to take PO   Dispo: The patient is from: Home              Anticipated d/c is to: Home              Anticipated d/c date is: > 3 days              Patient currently is not medically stable to d/c.         Consultants: ID, PCCM  Procedures: I&D of the left knee.  Antimicrobials:  Anti-infectives (From admission, onward)   Start     Dose/Rate Route Frequency Ordered Stop   06/12/19 1400  nafcillin 2 g in sodium chloride 0.9 % 100 mL IVPB     2 g 200 mL/hr over 30 Minutes Intravenous Every 4 hours 06/12/19 1303     06/12/19 1300  nafcillin injection 2 g  Status:  Discontinued     2 g Intravenous Every 4 hours 06/12/19 1258 06/12/19 1302   06/11/19 1645  ceFAZolin (ANCEF) IVPB 2g/100 mL premix  Status:  Discontinued     2 g 200 mL/hr over 30 Minutes Intravenous On call to O.R. 06/11/19 1626 06/11/19 1955   06/11/19 1600  ceFAZolin (ANCEF) IVPB 2g/100 mL premix  Status:  Discontinued     2 g 200 mL/hr over 30 Minutes Intravenous Every 8 hours 06/11/19 1514 06/12/19 1258   06/11/19 1200  vancomycin (VANCOREADY) IVPB 750 mg/150 mL  Status:  Discontinued     750 mg 150 mL/hr over 60 Minutes Intravenous Every 12 hours 06/11/19 0642 06/11/19 1514   06/11/19 0630  ceFEPIme (MAXIPIME) 2 g in sodium chloride 0.9 % 100 mL IVPB  Status:  Discontinued     2  g 200 mL/hr over 30 Minutes Intravenous Every 8 hours 06/11/19 0622 06/11/19 1514   06/11/19 0615  vancomycin (VANCOCIN) IVPB 1000 mg/200 mL premix  Status:  Discontinued    Note to Pharmacy: pharmacy to dose   1,000 mg 200 mL/hr over 60 Minutes Intravenous Every 24 hours 06/11/19 0607 06/11/19 0621   06/11/19 0615  ceFEPIme (MAXIPIME) 1 g in sodium chloride 0.9 % 100 mL IVPB  Status:  Discontinued     1 g 200 mL/hr over 30 Minutes Intravenous Every 8 hours 06/11/19 0607 06/11/19 0622   06/11/19 0030  vancomycin (VANCOCIN) IVPB 1000 mg/200 mL premix     1,000 mg 200 mL/hr over 60 Minutes Intravenous  Once 06/11/19 0015 06/11/19 0238   06/11/19 0030  ceFEPIme (MAXIPIME) 2 g in sodium chloride 0.9 % 100 mL IVPB     2 g 200 mL/hr over 30 Minutes Intravenous  Once 06/11/19 0015 06/11/19 0121      Subjective: Patient seen and examined at the bedside this morning.  Hemodynamically stable.  Continues to complain of pain all over her body and demanding Dilaudid saying that morphine is not working.  I knew from the RN that she is  making up her mind to sign out AMA.  Objective: Vitals:   06/14/19 1827 06/14/19 2109 06/15/19 0118 06/15/19 0516  BP: 103/63 (!) 100/58 103/68 106/66  Pulse: 94 97 88 (!) 112  Resp: (!) '22 20 18 16  ' Temp: (!) 100.5 F (38.1 C) 99 F (37.2 C) 98.4 F (36.9 C) 100.2 F (37.9 C)  TempSrc: Oral Oral Oral Oral  SpO2: 100% 100% 100% 99%  Weight:      Height:        Intake/Output Summary (Last 24 hours) at 06/15/2019 0850 Last data filed at 06/15/2019 0517 Gross per 24 hour  Intake 1801.59 ml  Output 9 ml  Net 1792.59 ml   Filed Weights   06/10/19 2344  Weight: 49.9 kg    Examination:  General exam: Irritable, complaining of pain Respiratory system:no wheezes or crackles  Cardiovascular system: S1 & S2 heard, RRR. No JVD, murmurs, rubs, gallops or clicks. Gastrointestinal system: Abdomen is nondistended, soft and nontender. No organomegaly or masses  felt. Normal bowel sounds heard. Central nervous system: Alert and oriented. No focal neurological deficits. Extremities: No edema, no clubbing ,no cyanosis, left knee wrapped with dressing  skin: Needle marks on the legs   Data Reviewed: I have personally reviewed following labs and imaging studies  CBC: Recent Labs  Lab 06/11/19 0005 06/12/19 0605 06/13/19 1153 06/14/19 0838 06/15/19 0756  WBC 24.6* 17.3* 17.3* 17.9* 18.8*  NEUTROABS 21.1*  --  14.4* 14.2* 14.4*  HGB 9.4* 8.7* 9.0* 7.7* 6.5*  HCT 27.7* 26.3* 27.1* 24.8* 18.6*  MCV 78.2* 79.7* 78.3* 85.5 75.9*  PLT  135* 105* 183 188 563   Basic Metabolic Panel: Recent Labs  Lab 06/11/19 0005 06/12/19 0849 06/13/19 1153 06/14/19 0838  NA 127* 138 141 137  K 3.2* 3.3* 2.6* 2.6*  CL 89* 105 108 105  CO2 '25 24 24 23  ' GLUCOSE 109* 227* 88 163*  BUN 27* 22* 17 9  CREATININE 0.76 0.70 0.66 0.52  CALCIUM 7.8* 7.4* 7.6* 7.1*  MG  --   --  2.5*  --    GFR: Estimated Creatinine Clearance: 83.9 mL/min (by C-G formula based on SCr of 0.52 mg/dL). Liver Function Tests: Recent Labs  Lab 06/11/19 0005  AST 162*  ALT 47*  ALKPHOS 139*  BILITOT 1.3*  PROT 7.6  ALBUMIN 2.0*   No results for input(s): LIPASE, AMYLASE in the last 168 hours. No results for input(s): AMMONIA in the last 168 hours. Coagulation Profile: Recent Labs  Lab 06/11/19 0005  INR 1.2   Cardiac Enzymes: Recent Labs  Lab 06/11/19 0232  CKTOTAL 146   BNP (last 3 results) No results for input(s): PROBNP in the last 8760 hours. HbA1C: No results for input(s): HGBA1C in the last 72 hours. CBG: No results for input(s): GLUCAP in the last 168 hours. Lipid Profile: No results for input(s): CHOL, HDL, LDLCALC, TRIG, CHOLHDL, LDLDIRECT in the last 72 hours. Thyroid Function Tests: No results for input(s): TSH, T4TOTAL, FREET4, T3FREE, THYROIDAB in the last 72 hours. Anemia Panel: No results for input(s): VITAMINB12, FOLATE, FERRITIN, TIBC, IRON,  RETICCTPCT in the last 72 hours. Sepsis Labs: Recent Labs  Lab 06/11/19 0005 06/11/19 0232 06/12/19 0849 06/13/19 1153  LATICACIDVEN 2.6* 1.9 3.0* 2.2*    Recent Results (from the past 240 hour(s))  Culture, blood (Routine x 2)     Status: Abnormal   Collection Time: 06/11/19 12:05 AM   Specimen: BLOOD  Result Value Ref Range Status   Specimen Description   Final    BLOOD BLOOD LEFT HAND Performed at [facility] [address], [place], [place] [address]    Special Requests   Final    BOTTLES DRAWN AEROBIC ONLY Blood Culture adequate volume Performed at [facility] [address], [place], [place] [address]    Culture  Setup Time   Final    GRAM POSITIVE COCCI IN CLUSTERS AEROBIC BOTTLE ONLY CRITICAL VALUE NOTED.  VALUE IS CONSISTENT WITH PREVIOUSLY REPORTED AND CALLED VALUE.    Culture (A)  Final    STAPHYLOCOCCUS AUREUS SUSCEPTIBILITIES PERFORMED ON PREVIOUS CULTURE WITHIN THE LAST 5 DAYS. Performed at [facility], [place] [address], [place], [place] [address]    Report Status 06/13/2019 FINAL  Final  Urine culture     Status: Abnormal   Collection Time: 06/11/19 12:05 AM   Specimen: In/Out Cath Urine  Result Value Ref Range Status   Specimen Description   Final    IN/OUT CATH URINE Performed at [facility] [address] [place], [place] [address]    Special Requests   Final    NONE Performed at [facility], [place] [address] [place],  [address]    Culture (A)  Final    >=100,000 COLONIES/mL KOCURIA SPECIES 10,000 COLONIES/mL STAPHYLOCOCCUS AUREUS Standardized susceptibility testing for this organism is not available. FOR KOCURIA SPECIES Performed at [facility], [place] [address], [place],  [address]    Report Status 06/13/2019 FINAL  Final   Organism ID, Bacteria STAPHYLOCOCCUS AUREUS (A)  Final  Susceptibility   Staphylococcus aureus - MIC*     CIPROFLOXACIN >=8 RESISTANT Resistant     GENTAMICIN <=0.5 SENSITIVE Sensitive     NITROFURANTOIN <=16 SENSITIVE Sensitive     OXACILLIN 0.5 SENSITIVE Sensitive     TETRACYCLINE <=1 SENSITIVE Sensitive     VANCOMYCIN 1 SENSITIVE Sensitive     TRIMETH/SULFA <=10 SENSITIVE Sensitive     CLINDAMYCIN <=0.25 SENSITIVE Sensitive     RIFAMPIN <=0.5 SENSITIVE Sensitive     Inducible Clindamycin NEGATIVE Sensitive     * 10,000 COLONIES/mL STAPHYLOCOCCUS AUREUS  Culture, blood (Routine x 2)     Status: Abnormal   Collection Time: 06/11/19 12:10 AM   Specimen: BLOOD  Result Value Ref Range Status   Specimen Description   Final    BLOOD BLOOD RIGHT FOREARM Performed at [facility] [address] [place], [place] [address]    Special Requests   Final    BOTTLES DRAWN AEROBIC ONLY Blood Culture adequate volume Performed at [facility] [address] [place], [place] [address]    Culture  Setup Time   Final    GRAM POSITIVE COCCI IN CLUSTERS AEROBIC BOTTLE ONLY CRITICAL RESULT CALLED TO, READ BACK BY AND VERIFIED WITH: [place] [phone] FCP Performed at [facility], [place] [address], [place], [place] [address]    Culture STAPHYLOCOCCUS AUREUS (A)  Final   Report Status 06/13/2019 FINAL  Final   Organism ID, Bacteria STAPHYLOCOCCUS AUREUS  Final      Susceptibility   Staphylococcus aureus - MIC*    CIPROFLOXACIN >=8 RESISTANT Resistant     ERYTHROMYCIN >=8 RESISTANT Resistant     GENTAMICIN <=0.5 SENSITIVE Sensitive     OXACILLIN 0.5 SENSITIVE Sensitive     TETRACYCLINE <=1 SENSITIVE Sensitive     VANCOMYCIN 1 SENSITIVE Sensitive     TRIMETH/SULFA <=10 SENSITIVE Sensitive     CLINDAMYCIN <=0.25 SENSITIVE Sensitive     RIFAMPIN <=0.5 SENSITIVE Sensitive     Inducible Clindamycin NEGATIVE Sensitive     * STAPHYLOCOCCUS AUREUS  Blood Culture ID Panel (Reflexed)     Status: Abnormal   Collection Time: 06/11/19 12:10 AM  Result  Value Ref Range Status   Enterococcus species NOT DETECTED NOT DETECTED Final   Listeria monocytogenes NOT DETECTED NOT DETECTED Final   Staphylococcus species DETECTED (A) NOT DETECTED Final    Comment: CRITICAL RESULT CALLED TO, READ BACK BY AND VERIFIED WITH: PHARMD MICHELLE B. [phone] FCP    Staphylococcus aureus (BCID) DETECTED (A) NOT DETECTED Final    Comment: Methicillin (oxacillin) susceptible Staphylococcus aureus (MSSA). Preferred therapy is anti staphylococcal beta lactam antibiotic (Cefazolin or Nafcillin), unless clinically contraindicated. CRITICAL RESULT CALLED TO, READ BACK BY AND VERIFIED WITH: PHARMD MICHELLE B. [phone] FCP    Methicillin resistance NOT DETECTED NOT DETECTED Final   Streptococcus species NOT DETECTED NOT DETECTED Final   Streptococcus agalactiae NOT DETECTED NOT DETECTED Final   Streptococcus pneumoniae NOT DETECTED NOT DETECTED Final   Streptococcus pyogenes NOT DETECTED NOT DETECTED Final   Acinetobacter baumannii NOT DETECTED NOT DETECTED Final   Enterobacteriaceae species NOT DETECTED NOT DETECTED Final   Enterobacter cloacae complex NOT DETECTED NOT DETECTED Final   Escherichia coli NOT DETECTED NOT DETECTED Final   Klebsiella oxytoca NOT DETECTED NOT DETECTED Final   Klebsiella pneumoniae NOT DETECTED NOT DETECTED Final   Proteus species NOT DETECTED NOT DETECTED Final   Serratia marcescens NOT DETECTED NOT DETECTED  Final   Haemophilus influenzae NOT DETECTED NOT DETECTED Final   Neisseria meningitidis NOT DETECTED NOT DETECTED Final   Pseudomonas aeruginosa NOT DETECTED NOT DETECTED Final   Candida albicans NOT DETECTED NOT DETECTED Final   Candida glabrata NOT DETECTED NOT DETECTED Final   Candida krusei NOT DETECTED NOT DETECTED Final   Candida parapsilosis NOT DETECTED NOT DETECTED Final   Candida tropicalis NOT DETECTED NOT DETECTED Final    Comment: Performed at [facility], [place] [address] [place], [place] [address]    SARS CORONAVIRUS 2 (TAT 6-24 HRS) Nasopharyngeal Nasopharyngeal Swab     Status: None   Collection Time: 06/11/19 12:52 AM   Specimen: Nasopharyngeal Swab  Result Value Ref Range Status   SARS Coronavirus 2 NEGATIVE NEGATIVE Final    Comment: (NOTE) SARS-CoV-2 target nucleic acids are NOT DETECTED. The SARS-CoV-2 RNA is generally detectable in upper and lower respiratory specimens during the acute phase of infection. Negative results do not preclude SARS-CoV-2 infection, do not rule out co-infections with other pathogens, and should not be used as the sole basis for treatment or other patient management decisions. Negative results must be combined with clinical observations, patient history, and epidemiological information. The expected result is Negative. Fact Sheet for Patients: SugarRoll.be Fact Sheet for Healthcare Providers: https://www.woods-mathews.com/ This test is not yet approved or cleared by the [place] FDA and  has been authorized for detection and/or diagnosis of SARS-CoV-2 by FDA under an Emergency Use Authorization (EUA). This EUA will remain  in effect (meaning this test can be used) for the duration of the COVID-19 declaration under Section 56 4(b)(1) of the Act, 21 U.S.C. section 360bbb-3(b)(1), unless the authorization is terminated or revoked sooner. Performed at [facility], [place] [address], [place], [place] [address]   Respiratory Panel by RT PCR (Flu A&B, Covid) - Nasopharyngeal Swab     Status: None   Collection Time: 06/11/19  5:25 AM   Specimen: Nasopharyngeal Swab  Result Value Ref Range Status   SARS Coronavirus 2 by RT PCR NEGATIVE NEGATIVE Final    Comment: (NOTE) SARS-CoV-2 target nucleic acids are NOT DETECTED. The SARS-CoV-2 RNA is generally detectable in upper respiratoy specimens during the acute phase of infection. The lowest concentration of SARS-CoV-2 viral copies this assay can detect  is 131 copies/mL. A negative result does not preclude SARS-Cov-2 infection and should not be used as the sole basis for treatment or other patient management decisions. A negative result may occur with  improper specimen collection/handling, submission of specimen other than nasopharyngeal swab, presence of viral mutation(s) within the areas targeted by this assay, and inadequate number of viral copies (<131 copies/mL). A negative result must be combined with clinical observations, patient history, and epidemiological information. The expected result is Negative. Fact Sheet for Patients:  PinkCheek.be Fact Sheet for Healthcare Providers:  GravelBags.it This test is not yet ap proved or cleared by the [place] FDA and  has been authorized for detection and/or diagnosis of SARS-CoV-2 by FDA under an Emergency Use Authorization (EUA). This EUA will remain  in effect (meaning this test can be used) for the duration of the COVID-19 declaration under Section 564(b)(1) of the Act, 21 U.S.C. section 360bbb-3(b)(1), unless the authorization is terminated or revoked sooner.    Influenza A by PCR NEGATIVE NEGATIVE Final   Influenza B by PCR NEGATIVE NEGATIVE Final    Comment: (NOTE) The Xpert Xpress SARS-CoV-2/FLU/RSV assay is intended as an aid in  the  diagnosis of influenza from Nasopharyngeal swab specimens and  should not be used as a sole basis for treatment. Nasal washings and  aspirates are unacceptable for Xpert Xpress SARS-CoV-2/FLU/RSV  testing. Fact Sheet for Patients: PinkCheek.be Fact Sheet for Healthcare Providers: GravelBags.it This test is not yet approved or cleared by the [place] FDA and  has been authorized for detection and/or diagnosis of SARS-CoV-2 by  FDA under an Emergency Use Authorization (EUA). This EUA will remain  in effect (meaning this  test can be used) for the duration of the  Covid-19 declaration under Section 564(b)(1) of the Act, 21  U.S.C. section 360bbb-3(b)(1), unless the authorization is  terminated or revoked. Performed at [facility], [place] [address], [place], [place] [address]   Body fluid culture     Status: None   Collection Time: 06/11/19  2:03 PM   Specimen: Synovium; Synovial Fluid  Result Value Ref Range Status   Specimen Description SYNOVIAL FLUID KNEE LEFT  Final   Special Requests   Final    NONE Performed at [facility] [address] [place], [place] [address]    Gram Stain   Final    ABUNDANT WBC PRESENT,BOTH PMN AND MONONUCLEAR NO ORGANISMS SEEN Gram Stain Report Called to,Read Back By and Verified With: DR. Lyla Glassing AT 1512 ON 06/11/19 BY N.THOMPSON Performed at [facility], [place] [address] [place], [place] [address]    Culture   Final    NO GROWTH 3 DAYS Performed at [facility], [place] [address] [place], [place] [address]    Report Status 06/14/2019 FINAL  Final  Anaerobic culture     Status: None (Preliminary result)   Collection Time: 06/11/19  2:03 PM   Specimen: Synovium; Synovial Fluid  Result Value Ref Range Status   Specimen Description SYNOVIAL FLUID KNEE LEFT  Final   Special Requests   Final    NONE Performed at [facility] [address], [place], [place] [address]    Culture   Final    NO ANAEROBES ISOLATED; CULTURE IN PROGRESS FOR 5 DAYS   Report Status PENDING  Incomplete  Aerobic/Anaerobic Culture (surgical/deep wound)     Status: None (Preliminary result)   Collection Time: 06/11/19  6:30 PM   Specimen: Synovial, Left Knee; Body Fluid  Result Value Ref Range Status   Specimen Description SYNOVIAL KNEE LEFT  Final   Special Requests NONE  Final   Gram Stain   Final    RARE WBC PRESENT, PREDOMINANTLY PMN NO ORGANISMS SEEN    Culture   Final    RARE STAPHYLOCOCCUS AUREUS NO  ANAEROBES ISOLATED; CULTURE IN PROGRESS FOR 5 DAYS    Report Status PENDING  Incomplete   Organism ID, Bacteria STAPHYLOCOCCUS AUREUS  Final      Susceptibility   Staphylococcus aureus - MIC*    CIPROFLOXACIN >=8 RESISTANT Resistant     ERYTHROMYCIN >=8 RESISTANT Resistant     GENTAMICIN <=0.5 SENSITIVE Sensitive     OXACILLIN 0.5 SENSITIVE Sensitive     TETRACYCLINE <=1 SENSITIVE Sensitive     VANCOMYCIN <=0.5 SENSITIVE Sensitive     TRIMETH/SULFA <=10 SENSITIVE Sensitive     CLINDAMYCIN <=0.25 SENSITIVE Sensitive     RIFAMPIN <=0.5 SENSITIVE Sensitive     Inducible Clindamycin Value in next row Sensitive      NEGATIVEPerformed at [facility] [address], [place], [place] [address]    * RARE STAPHYLOCOCCUS  AUREUS  Aerobic/Anaerobic Culture (surgical/deep wound)     Status: None (Preliminary result)   Collection Time: 06/11/19  6:32 PM   Specimen: Synovium; Tissue  Result Value Ref Range Status   Specimen Description   Final    SYNOVIAL TISSUE KNEE LEFT Performed at [facility] [address] [place], [place] [address]    Special Requests   Final    NONE Performed at [facility], [place] [address], [place], [place] [address]    Gram Stain   Final    NO WBC SEEN NO ORGANISMS SEEN Performed at [facility], [place] [address], [place], [place] [address]    Culture   Final    RARE STAPHYLOCOCCUS AUREUS SUSCEPTIBILITIES TO FOLLOW NO ANAEROBES ISOLATED; CULTURE IN PROGRESS FOR 5 DAYS    Report Status PENDING  Incomplete  Surgical PCR screen     Status: Abnormal   Collection Time: 06/11/19  8:49 PM   Specimen: Nasal Mucosa; Nasal Swab  Result Value Ref Range Status   MRSA, PCR NEGATIVE NEGATIVE Final   Staphylococcus aureus POSITIVE (A) NEGATIVE Final    Comment: (NOTE) The Xpert SA Assay (FDA approved for NASAL specimens in patients 27 years of age and older), is one component of a comprehensive surveillance program. It  is not intended to diagnose infection nor to guide or monitor treatment. Performed at [facility], [place] [address], [place], [place] [address]   Culture, blood (routine x 2)     Status: None (Preliminary result)   Collection Time: 06/13/19 11:53 AM   Specimen: BLOOD LEFT HAND  Result Value Ref Range Status   Specimen Description   Final    BLOOD LEFT HAND Performed at [facility] [address] [place], [place] [address]    Special Requests   Final    BOTTLES DRAWN AEROBIC AND ANAEROBIC Blood Culture adequate volume Performed at [facility] [address] [place], [place] [address]    Culture  Setup Time   Final    GRAM POSITIVE COCCI IN CLUSTERS AEROBIC BOTTLE ONLY CRITICAL RESULT CALLED TO, READ BACK BY AND VERIFIED WITH: PHARMD C SHADE [id number] AT 12 AM BY CM    Culture   Final    NO GROWTH 2 DAYS Performed at [facility], [place] [address], [place], [place] [address]    Report Status PENDING  Incomplete  Culture, blood (routine x 2)     Status: None (Preliminary result)   Collection Time: 06/13/19 11:53 AM   Specimen: BLOOD RIGHT HAND  Result Value Ref Range Status   Specimen Description   Final    BLOOD RIGHT HAND Performed at [facility] [address], [place], [place] [address]    Special Requests   Final    BOTTLES DRAWN AEROBIC AND ANAEROBIC Blood Culture results may not be optimal due to an inadequate volume of blood received in culture bottles Performed at [facility] [address] [place], [place] [address]    Culture  Setup Time   Final    GRAM POSITIVE COCCI IN CLUSTERS AEROBIC BOTTLE ONLY CRITICAL RESULT CALLED TO, READ BACK BY AND VERIFIED WITH: PHARMD C SHADE [id number] AT 13 AM BY CM    Culture   Final    NO GROWTH 2 DAYS Performed at [facility], [place] [address] [place], [place] [address]    Report Status PENDING  Incomplete  Radiology Studies: No results found.      Scheduled Meds: . sodium chloride   Intravenous Once  . docusate sodium  100 mg Oral BID  . famotidine  20 mg Oral BID  . mupirocin ointment  1 application Nasal BID  . nicotine  21 mg Transdermal Daily   Continuous Infusions: . nafcillin IV 2 g (06/15/19 0849)     LOS: 4 days    Time spent: 35 mins.More than 50% of that time was spent in counseling and/or coordination of care.      Shelly Coss, MD [facility] P4/02/2020, 8:50 AM

## 2019-06-15 NOTE — H&P (Addendum)
History and Physical    Michelle Doyle [id number] DOB: 04/28/1991 DOA: 06/15/2019  PCP: Patient, No Pcp Per Patient coming from:left ama   Chief Complaint:  HPI: Michelle Doyle is a 29 y.o. female with medical history significant of IV heroin abuse, untreated hepatitis C initially admitted for acute heroin withdrawal and sepsis.  Patient was at [facility] until this morning.  She was sent to [facility] in order to obtain a transesophageal echo.  When she went to [facility] and was found out that they could not do the TEE because her hemoglobin was critically low.  Then patient decided to go [place] and then she comes back to [facility], ER to get admitted. CT scan showed pulmonary embolism and splenic infarction.  She is currently on IV nafcillin.  She was seen by infectious disease and recommended IV nafcillin.  She also has left knee septic arthritis status post I&D of the left knee by Ortho. No nausea vomiting asking for food however had diarrhea in the ER. Patient has been refusing medications, labs, PICC line placement during her hospital stay.  Patient has MSSA bacteremia.  She is homeless.  ED Course: Pressure 102/61 pulse is 98 respiration 1800% on room air temperature 97.7  Review of Systems: As per HPI otherwise all other systems reviewed and are negative  Ambulatory Status: Ambulatory at baseline  Past Medical History:  Diagnosis Date  . Hepatitis C   . Opiate abuse, continuous ([place])     Past Surgical History:  Procedure Laterality Date  . IRRIGATION AND DEBRIDEMENT KNEE Left 06/11/2019   Procedure: IRRIGATION AND DEBRIDEMENT KNEE;  Surgeon: Rod Can, MD;  Location: [facility];  Service: Orthopedics;  Laterality: Left;    Social History   Socioeconomic History  . Marital status: Single    Spouse name: Not on file  . Number of children: Not on file  . Years of education: Not on file  . Highest education level: Not on file  Occupational History  . Not  on file  Tobacco Use  . Smoking status: Current Every Day Smoker  . Smokeless tobacco: Never Used  Substance and Sexual Activity  . Alcohol use: Not Currently  . Drug use: Yes    Types: Amphetamines    Comment: IV drug use daily (april 2021)  . Sexual activity: Not on file  Other Topics Concern  . Not on file  Social History Narrative  . Not on file   Social Determinants of Health   Financial Resource Strain:   . Difficulty of Paying Living Expenses:   Food Insecurity:   . Worried About Charity fundraiser in the Last Year:   . Arboriculturist in the Last Year:   Transportation Needs:   . Film/video editor (Medical):   Marland Kitchen Lack of Transportation (Non-Medical):   Physical Activity:   . Days of Exercise per Week:   . Minutes of Exercise per Session:   Stress:   . Feeling of Stress :   Social Connections:   . Frequency of Communication with Friends and Family:   . Frequency of Social Gatherings with Friends and Family:   . Attends Religious Services:   . Active Member of Clubs or Organizations:   . Attends Archivist Meetings:   Marland Kitchen Marital Status:   Intimate Partner Violence:   . Fear of Current or Ex-Partner:   . Emotionally Abused:   Marland Kitchen Physically Abused:   . Sexually Abused:  No Known Allergies  No family history on file.    Prior to Admission medications   Not on File    Physical Exam: Vitals:   06/15/19 1350 06/15/19 1415 06/15/19 1447 06/15/19 1530  BP: (!) 141/74 (!) 100/56 (!) 105/52 (!) 99/54  Pulse: (!) 137 (!) 126 (!) 108 (!) 105  Resp: (!) 28 (!) 27 (!) 24 20  Temp: 97.7 F (36.5 C)     TempSrc: Oral     SpO2: 100% 100% 100% 99%     . General:  Appears calm and comfortable . Eyes: PERRL, EOMI, normal lids, iris . ENT: grossly normal hearing, lips & tongue, mmm . Neck:  no LAD, masses or thyromegaly . Cardiovascular: RRR, no m/r/g. No LE edema.  Marland Kitchen Respiratory: CTA bilaterally, no w/r/r. Normal respiratory effort. . Abdomen:   soft, ntnd, NABS . Skin: no rash or induration seen on limited exam . Musculoskeletal: Left knee covered with dressing, eschars noted over the left leg and right leg. Marland Kitchen Psychiatric: grossly normal mood and affect, speech fluent and appropriate, AOx3 . Neurologic:  CN 2-12 grossly intact, moves all extremities in coordinated fashion, sensation intact  Labs on Admission: I have personally reviewed following labs and imaging studies  CBC: Recent Labs  Lab 06/11/19 0005 06/11/19 0005 06/12/19 0605 06/13/19 1153 06/14/19 0838 06/15/19 0756 06/15/19 1421  WBC 24.6*   < > 17.3* 17.3* 17.9* 18.8* 19.5*  NEUTROABS 21.1*  --   --  14.4* 14.2* 14.4* 16.3*  HGB 9.4*   < > 8.7* 9.0* 7.7* 6.5* 6.8*  HCT 27.7*   < > 26.3* 27.1* 24.8* 18.6* 20.9*  MCV 78.2*   < > 79.7* 78.3* 85.5 75.9* 80.4  PLT 135*   < > 105* 183 188 216 232   < > = values in this interval not displayed.   Basic Metabolic Panel: Recent Labs  Lab 06/11/19 0005 06/12/19 0849 06/13/19 1153 06/14/19 0838 06/15/19 0756  NA 127* 138 141 137 138  K 3.2* 3.3* 2.6* 2.6* 3.7  CL 89* 105 108 105 106  CO2 25 24 24 23 23   GLUCOSE 109* 227* 88 163* 87  BUN 27* 22* 17 9 6   CREATININE 0.76 0.70 0.66 0.52 0.48  CALCIUM 7.8* 7.4* 7.6* 7.1* 7.0*  MG  --   --  2.5*  --   --    GFR: Estimated Creatinine Clearance: 83.9 mL/min (by C-G formula based on SCr of 0.48 mg/dL). Liver Function Tests: Recent Labs  Lab 06/11/19 0005  AST 162*  ALT 47*  ALKPHOS 139*  BILITOT 1.3*  PROT 7.6  ALBUMIN 2.0*   No results for input(s): LIPASE, AMYLASE in the last 168 hours. No results for input(s): AMMONIA in the last 168 hours. Coagulation Profile: Recent Labs  Lab 06/11/19 0005  INR 1.2   Cardiac Enzymes: Recent Labs  Lab 06/11/19 0232  CKTOTAL 146   BNP (last 3 results) No results for input(s): PROBNP in the last 8760 hours. HbA1C: No results for input(s): HGBA1C in the last 72 hours. CBG: No results for input(s): GLUCAP in  the last 168 hours. Lipid Profile: No results for input(s): CHOL, HDL, LDLCALC, TRIG, CHOLHDL, LDLDIRECT in the last 72 hours. Thyroid Function Tests: No results for input(s): TSH, T4TOTAL, FREET4, T3FREE, THYROIDAB in the last 72 hours. Anemia Panel: No results for input(s): VITAMINB12, FOLATE, FERRITIN, TIBC, IRON, RETICCTPCT in the last 72 hours. Urine analysis:    Component Value Date/Time   COLORURINE AMBER (  A) 06/11/2019 0005   APPEARANCEUR CLOUDY (A) 06/11/2019 0005   LABSPEC 1.008 06/11/2019 0005   PHURINE 5.0 06/11/2019 0005   GLUCOSEU NEGATIVE 06/11/2019 0005   HGBUR MODERATE (A) 06/11/2019 0005   BILIRUBINUR NEGATIVE 06/11/2019 0005   KETONESUR NEGATIVE 06/11/2019 0005   PROTEINUR 30 (A) 06/11/2019 0005   NITRITE NEGATIVE 06/11/2019 0005   LEUKOCYTESUR LARGE (A) 06/11/2019 0005    Creatinine Clearance: Estimated Creatinine Clearance: 83.9 mL/min (by C-G formula based on SCr of 0.48 mg/dL).  Sepsis Labs: @LABRCNTIP (procalcitonin:4,lacticidven:4) ) Recent Results (from the past 240 hour(s))  Culture, blood (Routine x 2)     Status: Abnormal   Collection Time: 06/11/19 12:05 AM   Specimen: BLOOD  Result Value Ref Range Status   Specimen Description   Final    BLOOD BLOOD LEFT HAND Performed at [facility] [address] [place], [place] [address]    Special Requests   Final    BOTTLES DRAWN AEROBIC ONLY Blood Culture adequate volume Performed at [facility] [address] [place], [place] [address]    Culture  Setup Time   Final    GRAM POSITIVE COCCI IN CLUSTERS AEROBIC BOTTLE ONLY CRITICAL VALUE NOTED.  VALUE IS CONSISTENT WITH PREVIOUSLY REPORTED AND CALLED VALUE.    Culture (A)  Final    STAPHYLOCOCCUS AUREUS SUSCEPTIBILITIES PERFORMED ON PREVIOUS CULTURE WITHIN THE LAST 5 DAYS. Performed at [facility], [place] [address] [place], [place] [address]    Report Status 06/13/2019 FINAL  Final  Urine culture      Status: Abnormal   Collection Time: 06/11/19 12:05 AM   Specimen: In/Out Cath Urine  Result Value Ref Range Status   Specimen Description   Final    IN/OUT CATH URINE Performed at [facility] [address] [place], [place] [address]    Special Requests   Final    NONE Performed at [facility], [place] [address] [place], [place] [address]    Culture (A)  Final    >=100,000 COLONIES/mL KOCURIA SPECIES 10,000 COLONIES/mL STAPHYLOCOCCUS AUREUS Standardized susceptibility testing for this organism is not available. FOR KOCURIA SPECIES Performed at [facility], [place] [address] [place], [place] [address]    Report Status 06/13/2019 FINAL  Final   Organism ID, Bacteria STAPHYLOCOCCUS AUREUS (A)  Final      Susceptibility   Staphylococcus aureus - MIC*    CIPROFLOXACIN >=8 RESISTANT Resistant     GENTAMICIN <=0.5 SENSITIVE Sensitive     NITROFURANTOIN <=16 SENSITIVE Sensitive     OXACILLIN 0.5 SENSITIVE Sensitive     TETRACYCLINE <=1 SENSITIVE Sensitive     VANCOMYCIN 1 SENSITIVE Sensitive     TRIMETH/SULFA <=10 SENSITIVE Sensitive     CLINDAMYCIN <=0.25 SENSITIVE Sensitive     RIFAMPIN <=0.5 SENSITIVE Sensitive     Inducible Clindamycin NEGATIVE Sensitive     * 10,000 COLONIES/mL STAPHYLOCOCCUS AUREUS  Culture, blood (Routine x 2)     Status: Abnormal   Collection Time: 06/11/19 12:10 AM   Specimen: BLOOD  Result Value Ref Range Status   Specimen Description   Final    BLOOD BLOOD RIGHT FOREARM Performed at [facility] [address], [place], [place] [address]    Special Requests   Final    BOTTLES DRAWN AEROBIC ONLY Blood Culture adequate volume Performed at [facility] [address], [place], [place] [address]    Culture  Setup  Time   Final    GRAM POSITIVE COCCI IN CLUSTERS AEROBIC BOTTLE ONLY CRITICAL RESULT CALLED TO, READ BACK BY AND VERIFIED WITH: Ferguson [id number]  [id number] FCP Performed at [facility], [place] [address] [place], [place] [address]    Culture STAPHYLOCOCCUS AUREUS (A)  Final   Report Status 06/13/2019 FINAL  Final   Organism ID, Bacteria STAPHYLOCOCCUS AUREUS  Final      Susceptibility   Staphylococcus aureus - MIC*    CIPROFLOXACIN >=8 RESISTANT Resistant     ERYTHROMYCIN >=8 RESISTANT Resistant     GENTAMICIN <=0.5 SENSITIVE Sensitive     OXACILLIN 0.5 SENSITIVE Sensitive     TETRACYCLINE <=1 SENSITIVE Sensitive     VANCOMYCIN 1 SENSITIVE Sensitive     TRIMETH/SULFA <=10 SENSITIVE Sensitive     CLINDAMYCIN <=0.25 SENSITIVE Sensitive     RIFAMPIN <=0.5 SENSITIVE Sensitive     Inducible Clindamycin NEGATIVE Sensitive     * STAPHYLOCOCCUS AUREUS  Blood Culture ID Panel (Reflexed)     Status: Abnormal   Collection Time: 06/11/19 12:10 AM  Result Value Ref Range Status   Enterococcus species NOT DETECTED NOT DETECTED Final   Listeria monocytogenes NOT DETECTED NOT DETECTED Final   Staphylococcus species DETECTED (A) NOT DETECTED Final    Comment: CRITICAL RESULT CALLED TO, READ BACK BY AND VERIFIED WITH: PHARMD MICHELLE B. [id number] TO:[id number] FCP    Staphylococcus aureus (BCID) DETECTED (A) NOT DETECTED Final    Comment: Methicillin (oxacillin) susceptible Staphylococcus aureus (MSSA). Preferred therapy is anti staphylococcal beta lactam antibiotic (Cefazolin or Nafcillin), unless clinically contraindicated. CRITICAL RESULT CALLED TO, READ BACK BY AND VERIFIED WITH: PHARMD MICHELLE B. [id number] TO:[id number] FCP    Methicillin resistance NOT DETECTED NOT DETECTED Final   Streptococcus species NOT DETECTED NOT DETECTED Final   Streptococcus agalactiae NOT DETECTED NOT DETECTED Final   Streptococcus pneumoniae NOT DETECTED NOT DETECTED Final   Streptococcus pyogenes NOT DETECTED NOT DETECTED Final   Acinetobacter baumannii NOT DETECTED NOT DETECTED Final   Enterobacteriaceae species NOT DETECTED NOT DETECTED Final   Enterobacter cloacae  complex NOT DETECTED NOT DETECTED Final   Escherichia coli NOT DETECTED NOT DETECTED Final   Klebsiella oxytoca NOT DETECTED NOT DETECTED Final   Klebsiella pneumoniae NOT DETECTED NOT DETECTED Final   Proteus species NOT DETECTED NOT DETECTED Final   Serratia marcescens NOT DETECTED NOT DETECTED Final   Haemophilus influenzae NOT DETECTED NOT DETECTED Final   Neisseria meningitidis NOT DETECTED NOT DETECTED Final   Pseudomonas aeruginosa NOT DETECTED NOT DETECTED Final   Candida albicans NOT DETECTED NOT DETECTED Final   Candida glabrata NOT DETECTED NOT DETECTED Final   Candida krusei NOT DETECTED NOT DETECTED Final   Candida parapsilosis NOT DETECTED NOT DETECTED Final   Candida tropicalis NOT DETECTED NOT DETECTED Final    Comment: Performed at [facility], [place] [address] [place], [place] [address]  SARS CORONAVIRUS 2 (TAT 6-24 HRS) Nasopharyngeal Nasopharyngeal Swab     Status: None   Collection Time: 06/11/19 12:52 AM   Specimen: Nasopharyngeal Swab  Result Value Ref Range Status   SARS Coronavirus 2 NEGATIVE NEGATIVE Final    Comment: (NOTE) SARS-CoV-2 target nucleic acids are NOT DETECTED. The SARS-CoV-2 RNA is generally detectable in upper and lower respiratory specimens during the acute phase of infection. Negative results do not preclude SARS-CoV-2 infection, do not rule out co-infections with other pathogens, and should not be used as the sole basis for treatment or  other patient management decisions. Negative results must be combined with clinical observations, patient history, and epidemiological information. The expected result is Negative. Fact Sheet for Patients: SugarRoll.be Fact Sheet for Healthcare Providers: https://www.woods-.com/ This test is not yet approved or cleared by the [place] FDA and  has been authorized for detection and/or diagnosis of SARS-CoV-2 by FDA under an Emergency Use  Authorization (EUA). This EUA will remain  in effect (meaning this test can be used) for the duration of the COVID-19 declaration under Section 56 4(b)(1) of the Act, 21 U.S.C. section 360bbb-3(b)(1), unless the authorization is terminated or revoked sooner. Performed at [facility], [place] [address] [place], [place] [address]   Respiratory Panel by RT PCR (Flu A&B, Covid) - Nasopharyngeal Swab     Status: None   Collection Time: 06/11/19  5:25 AM   Specimen: Nasopharyngeal Swab  Result Value Ref Range Status   SARS Coronavirus 2 by RT PCR NEGATIVE NEGATIVE Final    Comment: (NOTE) SARS-CoV-2 target nucleic acids are NOT DETECTED. The SARS-CoV-2 RNA is generally detectable in upper respiratoy specimens during the acute phase of infection. The lowest concentration of SARS-CoV-2 viral copies this assay can detect is 131 copies/mL. A negative result does not preclude SARS-Cov-2 infection and should not be used as the sole basis for treatment or other patient management decisions. A negative result may occur with  improper specimen collection/handling, submission of specimen other than nasopharyngeal swab, presence of viral mutation(s) within the areas targeted by this assay, and inadequate number of viral copies (<131 copies/mL). A negative result must be combined with clinical observations, patient history, and epidemiological information. The expected result is Negative. Fact Sheet for Patients:  PinkCheek.be Fact Sheet for Healthcare Providers:  GravelBags.it This test is not yet ap proved or cleared by the [place] FDA and  has been authorized for detection and/or diagnosis of SARS-CoV-2 by FDA under an Emergency Use Authorization (EUA). This EUA will remain  in effect (meaning this test can be used) for the duration of the COVID-19 declaration under Section 564(b)(1) of the Act, 21 U.S.C. section 360bbb-3(b)(1),  unless the authorization is terminated or revoked sooner.    Influenza A by PCR NEGATIVE NEGATIVE Final   Influenza B by PCR NEGATIVE NEGATIVE Final    Comment: (NOTE) The Xpert Xpress SARS-CoV-2/FLU/RSV assay is intended as an aid in  the diagnosis of influenza from Nasopharyngeal swab specimens and  should not be used as a sole basis for treatment. Nasal washings and  aspirates are unacceptable for Xpert Xpress SARS-CoV-2/FLU/RSV  testing. Fact Sheet for Patients: PinkCheek.be Fact Sheet for Healthcare Providers: GravelBags.it This test is not yet approved or cleared by the [place] FDA and  has been authorized for detection and/or diagnosis of SARS-CoV-2 by  FDA under an Emergency Use Authorization (EUA). This EUA will remain  in effect (meaning this test can be used) for the duration of the  Covid-19 declaration under Section 564(b)(1) of the Act, 21  U.S.C. section 360bbb-3(b)(1), unless the authorization is  terminated or revoked. Performed at [facility], [place] [address], [place], [place] [address]   Body fluid culture     Status: None   Collection Time: 06/11/19  2:03 PM   Specimen: Synovium; Synovial Fluid  Result Value Ref Range Status   Specimen Description SYNOVIAL FLUID KNEE LEFT  Final   Special Requests   Final    NONE Performed at [facility] [address] [place], [place] [address]    Gram Stain   Final    ABUNDANT WBC PRESENT,BOTH PMN AND MONONUCLEAR NO ORGANISMS SEEN Gram Stain Report Called to,Read Back By and Verified With: DR. Lyla Glassing AT 1512 ON 06/11/19 BY N.THOMPSON Performed at [facility], [place] [address] [place], [place] [address]    Culture   Final    NO GROWTH 3 DAYS Performed at [facility], [place] [address], [place], [place] [address]    Report Status 06/14/2019 FINAL  Final  Anaerobic culture     Status: None  (Preliminary result)   Collection Time: 06/11/19  2:03 PM   Specimen: Synovium; Synovial Fluid  Result Value Ref Range Status   Specimen Description SYNOVIAL FLUID KNEE LEFT  Final   Special Requests   Final    NONE Performed at [place] [address] [place], [place] [address]    Culture   Final    NO ANAEROBES ISOLATED; CULTURE IN PROGRESS FOR 5 DAYS   Report Status PENDING  Incomplete  Aerobic/Anaerobic Culture (surgical/deep wound)     Status: None (Preliminary result)   Collection Time: 06/11/19  6:30 PM   Specimen: Synovial, Left Knee; Body Fluid  Result Value Ref Range Status   Specimen Description SYNOVIAL KNEE LEFT  Final   Special Requests NONE  Final   Gram Stain   Final    RARE WBC PRESENT, PREDOMINANTLY PMN NO ORGANISMS SEEN    Culture   Final    RARE STAPHYLOCOCCUS AUREUS NO ANAEROBES ISOLATED; CULTURE IN PROGRESS FOR 5 DAYS    Report Status PENDING  Incomplete   Organism ID, Bacteria STAPHYLOCOCCUS AUREUS  Final      Susceptibility   Staphylococcus aureus - MIC*    CIPROFLOXACIN >=8 RESISTANT Resistant     ERYTHROMYCIN >=8 RESISTANT Resistant     GENTAMICIN <=0.5 SENSITIVE Sensitive     OXACILLIN 0.5 SENSITIVE Sensitive     TETRACYCLINE <=1 SENSITIVE Sensitive     VANCOMYCIN <=0.5 SENSITIVE Sensitive     TRIMETH/SULFA <=10 SENSITIVE Sensitive     CLINDAMYCIN <=0.25 SENSITIVE Sensitive     RIFAMPIN <=0.5 SENSITIVE Sensitive     Inducible Clindamycin Value in next row Sensitive      NEGATIVEPerformed at [facility] [address], [place], [place] [address]    * RARE STAPHYLOCOCCUS AUREUS  Aerobic/Anaerobic Culture (surgical/deep wound)     Status: None (Preliminary result)   Collection Time: 06/11/19  6:32 PM   Specimen: Synovium; Tissue  Result Value Ref Range Status   Specimen Description   Final    SYNOVIAL TISSUE KNEE LEFT Performed at [facility] [address] [place], [place] [address]     Special Requests   Final    NONE Performed at [facility], [place] [address] [place], [place] [address]    Gram Stain   Final    NO WBC SEEN NO ORGANISMS SEEN Performed at [facility], [place] [address], [place], [place] [address]    Culture   Final    RARE STAPHYLOCOCCUS AUREUS NO ANAEROBES ISOLATED; CULTURE IN PROGRESS FOR 5 DAYS    Report Status PENDING  Incomplete   Organism ID, Bacteria STAPHYLOCOCCUS AUREUS  Final      Susceptibility   Staphylococcus aureus - MIC*    CIPROFLOXACIN >=8 RESISTANT Resistant     ERYTHROMYCIN >=8 RESISTANT Resistant     GENTAMICIN <=0.5 SENSITIVE Sensitive  OXACILLIN 0.5 SENSITIVE Sensitive     TETRACYCLINE <=1 SENSITIVE Sensitive     VANCOMYCIN 1 SENSITIVE Sensitive     TRIMETH/SULFA <=10 SENSITIVE Sensitive     CLINDAMYCIN <=0.25 SENSITIVE Sensitive     RIFAMPIN <=0.5 SENSITIVE Sensitive     Inducible Clindamycin NEGATIVE Sensitive     * RARE STAPHYLOCOCCUS AUREUS  Surgical PCR screen     Status: Abnormal   Collection Time: 06/11/19  8:49 PM   Specimen: Nasal Mucosa; Nasal Swab  Result Value Ref Range Status   MRSA, PCR NEGATIVE NEGATIVE Final   Staphylococcus aureus POSITIVE (A) NEGATIVE Final    Comment: (NOTE) The Xpert SA Assay (FDA approved for NASAL specimens in patients 57 years of age and older), is one component of a comprehensive surveillance program. It is not intended to diagnose infection nor to guide or monitor treatment. Performed at [facility], [place] [address], [place], [place] [address]   Culture, blood (routine x 2)     Status: Abnormal   Collection Time: 06/13/19 11:53 AM   Specimen: BLOOD LEFT HAND  Result Value Ref Range Status   Specimen Description   Final    BLOOD LEFT HAND Performed at [facility] [address] [place], [place] [address]    Special Requests   Final    BOTTLES DRAWN AEROBIC AND ANAEROBIC Blood Culture adequate  volume Performed at [place] [address], [place], [place] [address]    Culture  Setup Time   Final    GRAM POSITIVE COCCI IN CLUSTERS AEROBIC BOTTLE ONLY CRITICAL RESULT CALLED TO, READ BACK BY AND VERIFIED WITH: PHARMD C SHADE [id number] AT 12 AM BY CM    Culture (A)  Final    STAPHYLOCOCCUS AUREUS SUSCEPTIBILITIES PERFORMED ON PREVIOUS CULTURE WITHIN THE LAST 5 DAYS. Performed at [facility], [place] [address], [place], [place] [address]    Report Status 06/15/2019 FINAL  Final  Culture, blood (routine x 2)     Status: Abnormal   Collection Time: 06/13/19 11:53 AM   Specimen: BLOOD RIGHT HAND  Result Value Ref Range Status   Specimen Description   Final    BLOOD RIGHT HAND Performed at [facility] [address] [place], [place] [address]    Special Requests   Final    BOTTLES DRAWN AEROBIC AND ANAEROBIC Blood Culture results may not be optimal due to an inadequate volume of blood received in culture bottles Performed at [facility] [address], [place], [place] [address]    Culture  Setup Time   Final    GRAM POSITIVE COCCI IN CLUSTERS AEROBIC BOTTLE ONLY CRITICAL RESULT CALLED TO, READ BACK BY AND VERIFIED WITH: PHARMD C SHADE [id number] AT 67 AM BY CM    Culture (A)  Final    STAPHYLOCOCCUS AUREUS SUSCEPTIBILITIES PERFORMED ON PREVIOUS CULTURE WITHIN THE LAST 5 DAYS. Performed at [facility], [place] [address] [place], [place] [address]    Report Status 06/15/2019 FINAL  Final     Radiological Exams on Admission: No results found.  EKG: Not done Assessment/Plan Active Problems:   * No active hospital problems. *   #1 MSSA bacteremia-continue IV nafcillin per infectious disease. We will have to postpone TEE after blood transfusion or consider continuing IV nafcillin for 6 weeks with findings of aortic valve endocarditis by transthoracic echo. Repeat blood cultures were sent during the  prior hospital stay. ID  recommended MRI of the brain to rule out septic emboli. 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  Continue IV antibiotics.  Echo with ejection fraction 60 to 65%.  #3 septic emboli/splenic infarcts continue current management  #4 polysubstance abuse-/IV drug use with amphetamine and heroin -monitor for withdrawal  #5 left knee septic arthritis patient complaining of 10 out of 10 left knee pain.  She was on morphine and Percocet during the hospital stay.  Her blood pressure is soft at this time will recheck vitals again if blood pressure improved I will put her back on morphine.  CRP 16.8.  Lactic acid 1.8.  #6 untreated hepatitis C follow-up HCVRNA follow-up with ID on discharge  #7 hypokalemia potassium was 2.6 yesterday up to 3.7 after replacement.  Check magnesium level.  #8 tobacco abuse nicotine patch  #9 thrombocytopenia resolved  #10 normocytic anemia likely anemia of chronic disease with normal MCV.  Patient has been always anemic and her hemoglobin has been dropping from the time of admission till today.  Her hemoglobin today 6.82 units of blood transfusion ordered we will check anemia panel and FOBT.  Severity of Illness: The appropriate patient status for this patient is INPATIENT. Inpatient status is judged to be reasonable and necessary in order to provide the required intensity of service to ensure the patient's safety. The patient's presenting symptoms, physical exam findings, and initial radiographic and laboratory data in the context of their chronic comorbidities is felt to place them at high risk for further clinical deterioration. Furthermore, it is not anticipated that the patient will be medically stable for discharge from the hospital within 2 midnights of admission. The following factors support the patient status of inpatient.   " The patient's presenting symptoms  include left knee pain. 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   * I certify that at the point of admission it is my clinical judgment that the patient will require inpatient hospital care spanning beyond 2 midnights from the point of admission due to high intensity of service, high risk for further deterioration and high frequency of surveillance required.*   Estimated body mass index is 17.23 kg/m as calculated from the following:   Height as of an earlier encounter on 06/15/19: 5\' 7"  (1.702 m).   Weight as of an earlier encounter on 06/15/19: 49.9 kg.   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

## 2019-06-15 NOTE — Progress Notes (Signed)
  Cancelled TEE. Unable to perform due to bleeding risks.  Hg 6  Discussed with hospitalist team.   Since Tricuspid valve vegetation was seen on TTE, consider 6 weeks ABX via PICC line.   Candee Furbish, MD

## 2019-06-15 NOTE — Progress Notes (Signed)
Pt had critical hemoglobin of 6.5. Notified Dr. Marlou Porch cardiologist and anesthesia. Procedure cancelled per Dr. Marlou Porch. Dr. Marlou Porch talked to patient and discussed reason for cancellation.

## 2019-06-15 NOTE — Significant Event (Signed)
I got a report that patient was willing to sign [place] from the endoscopy suite.  I called the RN, talked to the patient and said that leaving the hospital is very dangerous at this point.  She has bacteremia and endocarditis and she needs IV antibiotics and needs to be treated in the hospital. she cannot be discharged on oral antibiotics.  I clearly discussed that this decision could be fatal for her.  I had seen her this morning at [facility] and she is completely alert and oriented and able to make her decisions.

## 2019-06-15 NOTE — Anesthesia Preprocedure Evaluation (Deleted)
Anesthesia Evaluation  Patient identified by MRN, date of birth, ID band Patient awake    Reviewed: Allergy & Precautions, NPO status , Patient's Chart, lab work & pertinent test results  Airway        Dental   Pulmonary Current Smoker,           Cardiovascular      Neuro/Psych    GI/Hepatic   Endo/Other    Renal/GU      Musculoskeletal   Abdominal   Peds  Hematology   Anesthesia Other Findings   Reproductive/Obstetrics                             Anesthesia Physical Anesthesia Plan  ASA: III  Anesthesia Plan: MAC   Post-op Pain Management:    Induction: Intravenous  PONV Risk Score and Plan:   Airway Management Planned: Natural Airway and Simple Face Mask  Additional Equipment:   Intra-op Plan:   Post-operative Plan:   Informed Consent: I have reviewed the patients History and Physical, chart, labs and discussed the procedure including the risks, benefits and alternatives for the proposed anesthesia with the patient or authorized representative who has indicated his/her understanding and acceptance.       Plan Discussed with:   Anesthesia Plan Comments:         Anesthesia Quick Evaluation

## 2019-06-15 NOTE — Progress Notes (Signed)
Pt called Endo RN to room and stated she wanted to leave.  MD paged and notified of patient wanting to leave AMA.  MD spoke to pt via phone.  Pt still requested to leave AMA.  Endo RN told pt that she needed to have antibiotics to treat her infection in her heart and if she did not get treatment for her infection, she would not survive.  Pt verbalized understanding and requested to leave AMA.  PIV removed prior to pt leaving AMA.  AMA papers signed.  Pt ambulated off hospital property.  Security and [employer] RN notified of pt leaving AMA.

## 2019-06-15 NOTE — ED Notes (Signed)
Pt given coke and popsicle

## 2019-06-15 NOTE — Progress Notes (Signed)
CRITICAL VALUE STICKER  CRITICAL VALUE: Hemoglobin 6.5  RECEIVER (on-site recipient of call): Michelle Doyle   DATE & TIME NOTIFIED: 06/15/19 at [place]    MD NOTIFIED: Dr. Tawanna Solo by [facility] page  [place]: [phone]

## 2019-06-15 NOTE — Progress Notes (Deleted)
Patient anxious and requesting PTA medication methadone 10mg . Notified on-call MD at 2018, new orders given for 1 time dose of medication.

## 2019-06-16 ENCOUNTER — Inpatient Hospital Stay (HOSPITAL_COMMUNITY): Payer: Self-pay

## 2019-06-16 DIAGNOSIS — F1910 Other psychoactive substance abuse, uncomplicated: Secondary | ICD-10-CM

## 2019-06-16 LAB — COMPREHENSIVE METABOLIC PANEL
ALT: 11 U/L (ref 0–44)
AST: 20 U/L (ref 15–41)
Albumin: 1.8 g/dL — ABNORMAL LOW (ref 3.5–5.0)
Alkaline Phosphatase: 72 U/L (ref 38–126)
Anion gap: 10 (ref 5–15)
BUN: <5 mg/dL — ABNORMAL LOW (ref 6–20)
CO2: 22 mmol/L (ref 22–32)
Calcium: 7.5 mg/dL — ABNORMAL LOW (ref 8.9–10.3)
Chloride: 108 mmol/L (ref 98–111)
Creatinine, Ser: 0.51 mg/dL (ref 0.44–1.00)
GFR calc Af Amer: >60 mL/min (ref >60)
GFR calc non Af Amer: >60 mL/min (ref >60)
Glucose, Bld: 102 mg/dL — ABNORMAL HIGH (ref 70–99)
Potassium: 3.9 mmol/L (ref 3.5–5.1)
Sodium: 140 mmol/L (ref 135–145)
Total Bilirubin: 0.9 mg/dL (ref 0.3–1.2)
Total Protein: 6.7 g/dL (ref 6.5–8.1)

## 2019-06-16 LAB — AEROBIC/ANAEROBIC CULTURE W GRAM STAIN (SURGICAL/DEEP WOUND)

## 2019-06-16 LAB — CBC
HCT: 35.7 % — ABNORMAL LOW (ref 36.0–46.0)
Hemoglobin: 11.7 g/dL — ABNORMAL LOW (ref 12.0–15.0)
MCH: 27.8 pg (ref 26.0–34.0)
MCHC: 32.8 g/dL (ref 30.0–36.0)
MCV: 84.8 fL (ref 80.0–100.0)
Platelets: 268 10*3/uL (ref 150–400)
RBC: 4.21 MIL/uL (ref 3.87–5.11)
RDW: 15.5 % (ref 11.5–15.5)
WBC: 19.5 10*3/uL — ABNORMAL HIGH (ref 4.0–10.5)
nRBC: 0.2 % (ref 0.0–0.2)

## 2019-06-16 LAB — HCV RNA QUANT RFLX ULTRA OR GENOTYP

## 2019-06-16 LAB — ANAEROBIC CULTURE

## 2019-06-16 LAB — C-REACTIVE PROTEIN: CRP: 22.2 mg/dL — ABNORMAL HIGH (ref <1.0)

## 2019-06-16 MED ORDER — ONDANSETRON HCL 4 MG/2ML IJ SOLN
4.0000 mg | Freq: Once | INTRAMUSCULAR | Status: AC
  Administered 2019-06-16: 4 mg via INTRAVENOUS
  Filled 2019-06-16: qty 2

## 2019-06-16 MED ORDER — ENOXAPARIN SODIUM 40 MG/0.4ML ~~LOC~~ SOLN
40.0000 mg | SUBCUTANEOUS | Status: DC
  Administered 2019-06-16 – 2019-06-28 (×12): 40 mg via SUBCUTANEOUS
  Filled 2019-06-16 (×15): qty 0.4

## 2019-06-16 MED ORDER — METHADONE HCL 10 MG PO TABS
10.0000 mg | ORAL_TABLET | Freq: Two times a day (BID) | ORAL | Status: DC
  Administered 2019-06-16 – 2019-06-19 (×8): 10 mg via ORAL
  Filled 2019-06-16 (×8): qty 1

## 2019-06-16 NOTE — ED Notes (Signed)
Patient requested for a diaper to be applied.

## 2019-06-16 NOTE — Plan of Care (Signed)
Patient is requesting her daily dose of Methadone to be restarted, paged Dr Tyrell Antonio and she requested to confirm with [facility] that she gets meds from. Called and left message with secretary- waiting a call back. Will continue to monitor.  Problem: Education: Goal: Knowledge of General Education information will improve Description: Including pain rating scale, medication(s)/side effects and non-pharmacologic comfort measures Outcome: Progressing   Problem: Health Behavior/Discharge Planning: Goal: Ability to manage health-related needs will improve Outcome: Progressing   Problem: Clinical Measurements: Goal: Ability to maintain clinical measurements within normal limits will improve Outcome: Progressing Goal: Will remain free from infection Outcome: Progressing Goal: Diagnostic test results will improve Outcome: Progressing Goal: Respiratory complications will improve Outcome: Progressing Goal: Cardiovascular complication will be avoided Outcome: Progressing   Problem: Activity: Goal: Risk for activity intolerance will decrease Outcome: Progressing   Problem: Nutrition: Goal: Adequate nutrition will be maintained Outcome: Progressing   Problem: Coping: Goal: Level of anxiety will decrease Outcome: Progressing   Problem: Elimination: Goal: Will not experience complications related to bowel motility Outcome: Progressing Goal: Will not experience complications related to urinary retention Outcome: Progressing   Problem: Pain Managment: Goal: General experience of comfort will improve Outcome: Progressing   Problem: Safety: Goal: Ability to remain free from injury will improve Outcome: Progressing   Problem: Skin Integrity: Goal: Risk for impaired skin integrity will decrease Outcome: Progressing

## 2019-06-16 NOTE — Discharge Summary (Incomplete Revision)
Physician Discharge Summary  Michelle Doyle [id number] DOB: 1990-12-02 DOA: 06/10/2019  PCP: Patient, No Pcp Per  Admit date: 06/10/2019 Discharge date: 06/16/2019  Admitted From: Home Disposition:  Signed AMA  Patient signed AMA.  For full details see the progress note written on 06/15/2019.  Discharge Diagnoses:  Active Problems:   Endocarditis   Sepsis [facility])    Discharge Instructions   Allergies as of 06/15/2019   No Known Allergies     Medication List    You have not been prescribed any medications.     No Known Allergies   Procedures/Studies: CT Chest W Contrast  Result Date: 06/11/2019 CLINICAL DATA:  Nausea and vomiting.  Sepsis.  IV drug abuse EXAM: CT CHEST, ABDOMEN, AND PELVIS WITH CONTRAST TECHNIQUE: Multidetector CT imaging of the chest, abdomen and pelvis was performed following the standard protocol during bolus administration of intravenous contrast. CONTRAST:  68mL OMNIPAQUE IOHEXOL 300 MG/ML  SOLN COMPARISON:  None. FINDINGS: CT CHEST FINDINGS Cardiovascular: Normal heart size. No pericardial effusion. No acute vascular finding. Mediastinum/Nodes: Mild generalized lymph node enlargement considered reactive. Lungs/Pleura: Numerous and generalized nodular ground-glass and consolidative opacities with variable cavitation. Largest cavities are measured at the right apex (2.4 cm) and lateral right base (2.6 cm). Borderline interlobular septal thickening/edema. No effusion or pneumothorax. Musculoskeletal: No acute finding CT ABDOMEN PELVIS FINDINGS Hepatobiliary: No focal liver abnormality.No evidence of biliary obstruction or stone. Pancreas: Unremarkable. Spleen: Generous size with 2 hypoenhancing wedges within the subcapsular upper and lower spleen. Adrenals/Urinary Tract: Negative adrenals. No hydronephrosis or stone. Small volume gas in the bladder, possibly from sampling. No bladder wall thickening. Stomach/Bowel: Liquid small and large bowel contents without bowel  wall thickening or appendicitis. No appendicitis. Vascular/Lymphatic: No acute vascular abnormality. No mass or adenopathy. Reproductive:No pathologic findings. Other: No ascites or pneumoperitoneum. Musculoskeletal: No acute abnormalities. IMPRESSION: 1. Pattern of septic pulmonary emboli. 2. Two splenic infarcts. Electronically Signed   By: Monte Fantasia M.D.   On: 06/11/2019 04:09   CT ABDOMEN PELVIS W CONTRAST  Result Date: 06/11/2019 CLINICAL DATA:  Nausea and vomiting.  Sepsis.  IV drug abuse EXAM: CT CHEST, ABDOMEN, AND PELVIS WITH CONTRAST TECHNIQUE: Multidetector CT imaging of the chest, abdomen and pelvis was performed following the standard protocol during bolus administration of intravenous contrast. CONTRAST:  14mL OMNIPAQUE IOHEXOL 300 MG/ML  SOLN COMPARISON:  None. FINDINGS: CT CHEST FINDINGS Cardiovascular: Normal heart size. No pericardial effusion. No acute vascular finding. Mediastinum/Nodes: Mild generalized lymph node enlargement considered reactive. Lungs/Pleura: Numerous and generalized nodular ground-glass and consolidative opacities with variable cavitation. Largest cavities are measured at the right apex (2.4 cm) and lateral right base (2.6 cm). Borderline interlobular septal thickening/edema. No effusion or pneumothorax. Musculoskeletal: No acute finding CT ABDOMEN PELVIS FINDINGS Hepatobiliary: No focal liver abnormality.No evidence of biliary obstruction or stone. Pancreas: Unremarkable. Spleen: Generous size with 2 hypoenhancing wedges within the subcapsular upper and lower spleen. Adrenals/Urinary Tract: Negative adrenals. No hydronephrosis or stone. Small volume gas in the bladder, possibly from sampling. No bladder wall thickening. Stomach/Bowel: Liquid small and large bowel contents without bowel wall thickening or appendicitis. No appendicitis. Vascular/Lymphatic: No acute vascular abnormality. No mass or adenopathy. Reproductive:No pathologic findings. Other: No ascites or  pneumoperitoneum. Musculoskeletal: No acute abnormalities. IMPRESSION: 1. Pattern of septic pulmonary emboli. 2. Two splenic infarcts. Electronically Signed   By: Monte Fantasia M.D.   On: 06/11/2019 04:09   CT L-SPINE NO CHARGE  Result Date: 06/11/2019 CLINICAL DATA:  Initial evaluation for diffuse  pain all over, opiate withdrawal. EXAM: CT LUMBAR SPINE WITHOUT CONTRAST TECHNIQUE: Multidetector CT imaging of the lumbar spine was performed without intravenous contrast administration. Multiplanar CT image reconstructions were also generated. COMPARISON:  None available. FINDINGS: Segmentation: Standard. Lowest well-formed disc space labeled the L5-S1 level. Alignment: Physiologic with preservation of the normal lumbar lordosis. No listhesis. Vertebrae: Vertebral body height maintained without evidence for acute or chronic fracture. No discrete or worrisome osseous lesions. No findings to suggest osteomyelitis discitis or septic arthritis. SI joints approximated symmetric. Paraspinal and other soft tissues: Paraspinous soft tissues demonstrate no acute finding. Multifocal nodular and parenchymal opacity seen within the partially visualized lungs, few of which demonstrate cavitary change, suggesting septic emboli. Remainder the visualized visceral structures otherwise unremarkable. Disc levels: No significant disc pathology seen within the lumbar spine. No appreciable epidural abscess or other collection. Small focal calcification noted at the distal aspect of the thecal sac, of doubtful significance. No significant canal or foraminal stenosis. IMPRESSION: 1. No acute abnormality within the lumbar spine. No findings to suggest osteomyelitis discitis or septic arthritis. 2. Multifocal nodular and parenchymal opacity within the partially visualized lungs, few of which demonstrate cavitary change, suggesting septic emboli. Electronically Signed   By: Jeannine Boga M.D.   On: 06/11/2019 03:59   DG Chest  Portable 1 View  Result Date: 06/11/2019 CLINICAL DATA:  Fever and IV drug use. EXAM: PORTABLE CHEST 1 VIEW COMPARISON:  None. FINDINGS: There is scattered bilateral pulmonary opacities. Some of these opacities appear to be cavitary. There is no pneumothorax. There may be a small right-sided pleural effusion. No acute osseous abnormality. Heart size is normal. IMPRESSION: Diffuse bilateral pulmonary opacities, some of which may be cavitary, suggestive of diffuse septic emboli or multifocal pneumonia. Electronically Signed   By: Constance Holster M.D.   On: 06/11/2019 00:46   DG Knee Left Port  Result Date: 06/11/2019 CLINICAL DATA:  29 year old female with left knee pain and swelling. EXAM: PORTABLE LEFT KNEE - 1-2 VIEW COMPARISON:  None. FINDINGS: No evidence of fracture, dislocation, or joint effusion. No evidence of arthropathy or other focal bone abnormality. Soft tissues are unremarkable. IMPRESSION: Negative. Electronically Signed   By: Anner Crete M.D.   On: 06/11/2019 15:00   DG Foot 2 Views Right  Result Date: 06/11/2019 CLINICAL DATA:  Right foot pain and swelling.  IV drug abuse EXAM: RIGHT FOOT - 2 VIEW COMPARISON:  None. FINDINGS: There is no evidence of fracture or dislocation. There is no evidence of arthropathy or other focal bone abnormality. Soft tissues are unremarkable. IMPRESSION: Negative. Electronically Signed   By: Davina Poke D.O.   On: 06/11/2019 15:01   ECHOCARDIOGRAM COMPLETE  Result Date: 06/11/2019    ECHOCARDIOGRAM REPORT   Patient Name:   Jeannetta Ellis Date of Exam: 06/11/2019 Medical Rec #:  [id number]    Height:       67.0 in Accession #:    [id number]   Weight:       110.0 lb Date of Birth:  04/28/1991    BSA:          1.569 m Patient Age:    28 years     BP:           108/55 mmHg Patient Gender: F            HR:           95 bpm. Exam Location:  Inpatient Procedure: 2D Echo, Color Doppler and Cardiac Doppler Indications:  Endocarditis i38  History:         Patient has no prior history of Echocardiogram examinations.                 IVDU.  Sonographer:    Raquel Sarna Senior RDCS Referring Phys: [id number] [place]  1. Left ventricular ejection fraction, by estimation, is 60 to 65%. The left ventricle has normal function. The left ventricle has no regional wall motion abnormalities. Left ventricular diastolic parameters were normal.  2. Right ventricular systolic function is normal. The right ventricular size is normal. There is normal pulmonary artery systolic pressure.  3. Thickened mitral valve. Can not exclude vegetation. . The mitral valve is abnormal. Mild to moderate mitral valve regurgitation. No evidence of mitral stenosis.  4. Thickened tricuspid valve, 1.1 x 1.7 cm vegetation. . The tricuspid valve is abnormal. Tricuspid valve regurgitation is mild to moderate.  5. The aortic valve is normal in structure. Aortic valve regurgitation is not visualized. No aortic stenosis is present.  6. The inferior vena cava is normal in size with greater than 50% respiratory variability, suggesting right atrial pressure of 3 mmHg. Conclusion(s)/Recommendation(s): Findings concerning for tricuspid valve and possible mitral valve vegetation, would recommend a Transesophageal Echocardiogram for clarification. FINDINGS  Left Ventricle: Left ventricular ejection fraction, by estimation, is 60 to 65%. The left ventricle has normal function. The left ventricle has no regional wall motion abnormalities. The left ventricular internal cavity size was normal in size. There is  no left ventricular hypertrophy. Left ventricular diastolic parameters were normal. Right Ventricle: The right ventricular size is normal. No increase in right ventricular wall thickness. Right ventricular systolic function is normal. There is normal pulmonary artery systolic pressure. The tricuspid regurgitant velocity is 2.52 m/s, and  with an assumed right atrial pressure of 3 mmHg, the estimated  right ventricular systolic pressure is [id number] mmHg. Left Atrium: Left atrial size was normal in size. Right Atrium: Right atrial size was normal in size. Pericardium: There is no evidence of pericardial effusion. Mitral Valve: Thickened mitral valve. Can not exclude vegetation. The mitral valve is abnormal. Normal mobility of the mitral valve leaflets. Mild to moderate mitral valve regurgitation. No evidence of mitral valve stenosis. Tricuspid Valve: Thickened tricuspid valve, 1.1 x 1.7 cm vegetation. The tricuspid valve is abnormal. Tricuspid valve regurgitation is mild to moderate. No evidence of tricuspid stenosis. Aortic Valve: The aortic valve is normal in structure. Aortic valve regurgitation is not visualized. No aortic stenosis is present. Pulmonic Valve: The pulmonic valve was normal in structure. Pulmonic valve regurgitation is not visualized. No evidence of pulmonic stenosis. Aorta: The aortic root is normal in size and structure. Venous: The inferior vena cava is normal in size with greater than 50% respiratory variability, suggesting right atrial pressure of 3 mmHg. IAS/Shunts: No atrial level shunt detected by color flow Doppler.  LEFT VENTRICLE PLAX 2D LVIDd:         4.30 cm LVIDs:         3.10 cm LV PW:         0.95 cm LV IVS:        1.04 cm LVOT diam:     2.00 cm LVOT Area:     3.14 cm  RIGHT VENTRICLE RV S prime:     16.40 cm/s TAPSE (M-mode): 2.7 cm LEFT ATRIUM             Index       RIGHT ATRIUM  Index LA diam:        3.40 cm 2.17 cm/m  RA Area:     18.10 cm LA Vol (A2C):   43.3 ml 27.61 ml/m RA Volume:   55.20 ml  35.19 ml/m LA Vol (A4C):   24.6 ml 15.68 ml/m LA Biplane Vol: 33.1 ml 21.10 ml/m   AORTA Ao Root diam: 2.60 cm Ao Asc diam:  2.90 cm TRICUSPID VALVE TR Peak grad:   25.4 mmHg TR Vmax:        252.00 cm/s  SHUNTS Systemic Diam: 2.00 cm Candee Furbish MD Electronically signed by Candee Furbish MD Signature Date/Time: 06/11/2019/3:28:02 PM    Final           Discharge  Exam: Vitals:   06/15/19 0920 06/15/19 0952  BP: 101/67 110/66  Pulse: (!) 107 (!) 103  Resp: 20 (!) 25  Temp: 99.7 F (37.6 C) 99.6 F (37.6 C)  SpO2: 100% 98%   Vitals:   06/15/19 0118 06/15/19 0516 06/15/19 0920 06/15/19 0952  BP: 103/68 106/66 101/67 110/66  Pulse: 88 (!) 112 (!) 107 (!) 103  Resp: 18 16 20  (!) 25  Temp: 98.4 F (36.9 C) 100.2 F (37.9 C) 99.7 F (37.6 C) 99.6 F (37.6 C)  TempSrc: Oral Oral Oral Oral  SpO2: 100% 99% 100% 98%  Weight:    49.9 kg  Height:    5\' 7"  (1.702 m)     The results of significant diagnostics from this hospitalization (including imaging, microbiology, ancillary and laboratory) are listed below for reference.     Microbiology: Recent Results (from the past 240 hour(s))  Culture, blood (Routine x 2)     Status: Abnormal   Collection Time: 06/11/19 12:05 AM   Specimen: BLOOD  Result Value Ref Range Status   Specimen Description   Final    BLOOD BLOOD LEFT HAND Performed at [facility] [address] [place], [place] [address]    Special Requests   Final    BOTTLES DRAWN AEROBIC ONLY Blood Culture adequate volume Performed at [facility] [address] [place], [place] [address]    Culture  Setup Time   Final    GRAM POSITIVE COCCI IN CLUSTERS AEROBIC BOTTLE ONLY CRITICAL VALUE NOTED.  VALUE IS CONSISTENT WITH PREVIOUSLY REPORTED AND CALLED VALUE.    Culture (A)  Final    STAPHYLOCOCCUS AUREUS SUSCEPTIBILITIES PERFORMED ON PREVIOUS CULTURE WITHIN THE LAST 5 DAYS. Performed at [facility], [place] [address], [place], [place] [address]    Report Status 06/13/2019 FINAL  Final  Urine culture     Status: Abnormal   Collection Time: 06/11/19 12:05 AM   Specimen: In/Out Cath Urine  Result Value Ref Range Status   Specimen Description   Final    IN/OUT CATH URINE Performed at [facility] [address], [place], [place] [address]    Special Requests    Final    NONE Performed at [facility] [address], [place] [address] [place], [place] [address]    Culture (A)  Final    >=100,000 COLONIES/mL KOCURIA SPECIES 10,000 COLONIES/mL STAPHYLOCOCCUS AUREUS Standardized susceptibility testing for this organism is not available. FOR KOCURIA SPECIES Performed at [facility], [place] [address], [place], [place] [address]    Report Status 06/13/2019 FINAL  Final   Organism ID, Bacteria STAPHYLOCOCCUS AUREUS (A)  Final      Susceptibility   Staphylococcus aureus - MIC*  CIPROFLOXACIN >=8 RESISTANT Resistant     GENTAMICIN <=0.5 SENSITIVE Sensitive     NITROFURANTOIN <=16 SENSITIVE Sensitive     OXACILLIN 0.5 SENSITIVE Sensitive     TETRACYCLINE <=1 SENSITIVE Sensitive     VANCOMYCIN 1 SENSITIVE Sensitive     TRIMETH/SULFA <=10 SENSITIVE Sensitive     CLINDAMYCIN <=0.25 SENSITIVE Sensitive     RIFAMPIN <=0.5 SENSITIVE Sensitive     Inducible Clindamycin NEGATIVE Sensitive     * 10,000 COLONIES/mL STAPHYLOCOCCUS AUREUS  Culture, blood (Routine x 2)     Status: Abnormal   Collection Time: 06/11/19 12:10 AM   Specimen: BLOOD  Result Value Ref Range Status   Specimen Description   Final    BLOOD BLOOD RIGHT FOREARM Performed at [facility] [address] [place], [place] [address]    Special Requests   Final    BOTTLES DRAWN AEROBIC ONLY Blood Culture adequate volume Performed at [facility] [address] [place], [place] [address]    Culture  Setup Time   Final    GRAM POSITIVE COCCI IN CLUSTERS AEROBIC BOTTLE ONLY CRITICAL RESULT CALLED TO, READ BACK BY AND VERIFIED WITH: [place] [id number] FCP Performed at [facility], [place] [address], [place], [place] [address]    Culture STAPHYLOCOCCUS AUREUS (A)  Final   Report Status 06/13/2019 FINAL  Final   Organism ID, Bacteria STAPHYLOCOCCUS AUREUS  Final      Susceptibility   Staphylococcus aureus - MIC*     CIPROFLOXACIN >=8 RESISTANT Resistant     ERYTHROMYCIN >=8 RESISTANT Resistant     GENTAMICIN <=0.5 SENSITIVE Sensitive     OXACILLIN 0.5 SENSITIVE Sensitive     TETRACYCLINE <=1 SENSITIVE Sensitive     VANCOMYCIN 1 SENSITIVE Sensitive     TRIMETH/SULFA <=10 SENSITIVE Sensitive     CLINDAMYCIN <=0.25 SENSITIVE Sensitive     RIFAMPIN <=0.5 SENSITIVE Sensitive     Inducible Clindamycin NEGATIVE Sensitive     * STAPHYLOCOCCUS AUREUS  Blood Culture ID Panel (Reflexed)     Status: Abnormal   Collection Time: 06/11/19 12:10 AM  Result Value Ref Range Status   Enterococcus species NOT DETECTED NOT DETECTED Final   Listeria monocytogenes NOT DETECTED NOT DETECTED Final   Staphylococcus species DETECTED (A) NOT DETECTED Final    Comment: CRITICAL RESULT CALLED TO, READ BACK BY AND VERIFIED WITH: PHARMD MICHELLE B. [id number] TO:[id number] FCP    Staphylococcus aureus (BCID) DETECTED (A) NOT DETECTED Final    Comment: Methicillin (oxacillin) susceptible Staphylococcus aureus (MSSA). Preferred therapy is anti staphylococcal beta lactam antibiotic (Cefazolin or Nafcillin), unless clinically contraindicated. CRITICAL RESULT CALLED TO, READ BACK BY AND VERIFIED WITH: PHARMD MICHELLE B. [id number] TO:[id number] FCP    Methicillin resistance NOT DETECTED NOT DETECTED Final   Streptococcus species NOT DETECTED NOT DETECTED Final   Streptococcus agalactiae NOT DETECTED NOT DETECTED Final   Streptococcus pneumoniae NOT DETECTED NOT DETECTED Final   Streptococcus pyogenes NOT DETECTED NOT DETECTED Final   Acinetobacter baumannii NOT DETECTED NOT DETECTED Final   Enterobacteriaceae species NOT DETECTED NOT DETECTED Final   Enterobacter cloacae complex NOT DETECTED NOT DETECTED Final   Escherichia coli NOT DETECTED NOT DETECTED Final   Klebsiella oxytoca NOT DETECTED NOT DETECTED Final   Klebsiella pneumoniae NOT DETECTED NOT DETECTED Final   Proteus species NOT DETECTED NOT DETECTED Final   Serratia marcescens NOT DETECTED  NOT DETECTED Final   Haemophilus influenzae NOT DETECTED NOT DETECTED Final  Neisseria meningitidis NOT DETECTED NOT DETECTED Final   Pseudomonas aeruginosa NOT DETECTED NOT DETECTED Final   Candida albicans NOT DETECTED NOT DETECTED Final   Candida glabrata NOT DETECTED NOT DETECTED Final   Candida krusei NOT DETECTED NOT DETECTED Final   Candida parapsilosis NOT DETECTED NOT DETECTED Final   Candida tropicalis NOT DETECTED NOT DETECTED Final    Comment: Performed at [facility], [place] [address], [place], [place] [address]  SARS CORONAVIRUS 2 (TAT 6-24 HRS) Nasopharyngeal Nasopharyngeal Swab     Status: None   Collection Time: 06/11/19 12:52 AM   Specimen: Nasopharyngeal Swab  Result Value Ref Range Status   SARS Coronavirus 2 NEGATIVE NEGATIVE Final    Comment: (NOTE) SARS-CoV-2 target nucleic acids are NOT DETECTED. The SARS-CoV-2 RNA is generally detectable in upper and lower respiratory specimens during the acute phase of infection. Negative results do not preclude SARS-CoV-2 infection, do not rule out co-infections with other pathogens, and should not be used as the sole basis for treatment or other patient management decisions. Negative results must be combined with clinical observations, patient history, and epidemiological information. The expected result is Negative. Fact Sheet for Patients: SugarRoll.be Fact Sheet for Healthcare Providers: https://www.woods-mathews.com/ This test is not yet approved or cleared by the [place] FDA and  has been authorized for detection and/or diagnosis of SARS-CoV-2 by FDA under an Emergency Use Authorization (EUA). This EUA will remain  in effect (meaning this test can be used) for the duration of the COVID-19 declaration under Section 56 4(b)(1) of the Act, 21 U.S.C. section 360bbb-3(b)(1), unless the authorization is terminated or revoked sooner. Performed at [facility],  [place] [address], [place], [place] [address]   Respiratory Panel by RT PCR (Flu A&B, Covid) - Nasopharyngeal Swab     Status: None   Collection Time: 06/11/19  5:25 AM   Specimen: Nasopharyngeal Swab  Result Value Ref Range Status   SARS Coronavirus 2 by RT PCR NEGATIVE NEGATIVE Final    Comment: (NOTE) SARS-CoV-2 target nucleic acids are NOT DETECTED. The SARS-CoV-2 RNA is generally detectable in upper respiratoy specimens during the acute phase of infection. The lowest concentration of SARS-CoV-2 viral copies this assay can detect is 131 copies/mL. A negative result does not preclude SARS-Cov-2 infection and should not be used as the sole basis for treatment or other patient management decisions. A negative result may occur with  improper specimen collection/handling, submission of specimen other than nasopharyngeal swab, presence of viral mutation(s) within the areas targeted by this assay, and inadequate number of viral copies (<131 copies/mL). A negative result must be combined with clinical observations, patient history, and epidemiological information. The expected result is Negative. Fact Sheet for Patients:  PinkCheek.be Fact Sheet for Healthcare Providers:  GravelBags.it This test is not yet ap proved or cleared by the [place] FDA and  has been authorized for detection and/or diagnosis of SARS-CoV-2 by FDA under an Emergency Use Authorization (EUA). This EUA will remain  in effect (meaning this test can be used) for the duration of the COVID-19 declaration under Section 564(b)(1) of the Act, 21 U.S.C. section 360bbb-3(b)(1), unless the authorization is terminated or revoked sooner.    Influenza A by PCR NEGATIVE NEGATIVE Final   Influenza B by PCR NEGATIVE NEGATIVE Final    Comment: (NOTE) The Xpert Xpress SARS-CoV-2/FLU/RSV assay is intended as an aid in  the diagnosis of influenza from Nasopharyngeal swab  specimens and  should not be used  as a sole basis for treatment. Nasal washings and  aspirates are unacceptable for Xpert Xpress SARS-CoV-2/FLU/RSV  testing. Fact Sheet for Patients: PinkCheek.be Fact Sheet for Healthcare Providers: GravelBags.it This test is not yet approved or cleared by the [place] FDA and  has been authorized for detection and/or diagnosis of SARS-CoV-2 by  FDA under an Emergency Use Authorization (EUA). This EUA will remain  in effect (meaning this test can be used) for the duration of the  Covid-19 declaration under Section 564(b)(1) of the Act, 21  U.S.C. section 360bbb-3(b)(1), unless the authorization is  terminated or revoked. Performed at [facility], [place] [address] [place], [place] [address]   Body fluid culture     Status: None   Collection Time: 06/11/19  2:03 PM   Specimen: Synovium; Synovial Fluid  Result Value Ref Range Status   Specimen Description SYNOVIAL FLUID KNEE LEFT  Final   Special Requests   Final    NONE Performed at [facility] [address] [place], [place] [address]    Gram Stain   Final    ABUNDANT WBC PRESENT,BOTH PMN AND MONONUCLEAR NO ORGANISMS SEEN Gram Stain Report Called to,Read Back By and Verified With: DR. Lyla Glassing AT 1512 ON 06/11/19 BY N.THOMPSON Performed at [facility], [place] [address] [place], [place] [address]    Culture   Final    NO GROWTH 3 DAYS Performed at [facility], [place] [address], [place], [place] [address]    Report Status 06/14/2019 FINAL  Final  Anaerobic culture     Status: None   Collection Time: 06/11/19  2:03 PM   Specimen: Synovium; Synovial Fluid  Result Value Ref Range Status   Specimen Description SYNOVIAL FLUID KNEE LEFT  Final   Special Requests   Final    NONE Performed at [facility] [address] [place], [place] [address]     Culture   Final    NO ANAEROBES ISOLATED Performed at [facility], [place] [address] [place], [place] [address]    Report Status 06/16/2019 FINAL  Final  Aerobic/Anaerobic Culture (surgical/deep wound)     Status: None   Collection Time: 06/11/19  6:30 PM   Specimen: Synovial, Left Knee; Body Fluid  Result Value Ref Range Status   Specimen Description   Final    SYNOVIAL KNEE LEFT Performed at [facility] [address], [place], [place] [address]    Special Requests   Final    NONE Performed at [facility], [place] [address], [place], [place] [address]    Gram Stain   Final    RARE WBC PRESENT, PREDOMINANTLY PMN NO ORGANISMS SEEN    Culture   Final    RARE STAPHYLOCOCCUS AUREUS NO ANAEROBES ISOLATED Performed at [facility], [place] [address] [place], [place] [address]    Report Status 06/16/2019 FINAL  Final   Organism ID, Bacteria STAPHYLOCOCCUS AUREUS  Final      Susceptibility   Staphylococcus aureus - MIC*    CIPROFLOXACIN >=8 RESISTANT Resistant     ERYTHROMYCIN >=8 RESISTANT Resistant     GENTAMICIN <=0.5 SENSITIVE Sensitive     OXACILLIN 0.5 SENSITIVE Sensitive     TETRACYCLINE <=1 SENSITIVE Sensitive     VANCOMYCIN <=0.5 SENSITIVE Sensitive     TRIMETH/SULFA <=10 SENSITIVE Sensitive     CLINDAMYCIN <=0.25 SENSITIVE Sensitive     RIFAMPIN <=0.5 SENSITIVE Sensitive  Inducible Clindamycin NEGATIVE Sensitive     * RARE STAPHYLOCOCCUS AUREUS  Aerobic/Anaerobic Culture (surgical/deep wound)     Status: None (Preliminary result)   Collection Time: 06/11/19  6:32 PM   Specimen: Synovium; Tissue  Result Value Ref Range Status   Specimen Description   Final    SYNOVIAL TISSUE KNEE LEFT Performed at [facility] [address], [place], [place] [address]    Special Requests   Final    NONE Performed at [facility], [place] [address], [place], [place] [address]    Gram Stain    Final    NO WBC SEEN NO ORGANISMS SEEN Performed at [facility], [place] [address], [place], [place] [address]    Culture RARE STAPHYLOCOCCUS AUREUS  Final   Report Status PENDING  Incomplete   Organism ID, Bacteria STAPHYLOCOCCUS AUREUS  Final      Susceptibility   Staphylococcus aureus - MIC*    CIPROFLOXACIN >=8 RESISTANT Resistant     ERYTHROMYCIN >=8 RESISTANT Resistant     GENTAMICIN <=0.5 SENSITIVE Sensitive     OXACILLIN 0.5 SENSITIVE Sensitive     TETRACYCLINE <=1 SENSITIVE Sensitive     VANCOMYCIN 1 SENSITIVE Sensitive     TRIMETH/SULFA <=10 SENSITIVE Sensitive     CLINDAMYCIN <=0.25 SENSITIVE Sensitive     RIFAMPIN <=0.5 SENSITIVE Sensitive     Inducible Clindamycin NEGATIVE Sensitive     * RARE STAPHYLOCOCCUS AUREUS  Surgical PCR screen     Status: Abnormal   Collection Time: 06/11/19  8:49 PM   Specimen: Nasal Mucosa; Nasal Swab  Result Value Ref Range Status   MRSA, PCR NEGATIVE NEGATIVE Final   Staphylococcus aureus POSITIVE (A) NEGATIVE Final    Comment: (NOTE) The Xpert SA Assay (FDA approved for NASAL specimens in patients 80 years of age and older), is one component of a comprehensive surveillance program. It is not intended to diagnose infection nor to guide or monitor treatment. Performed at [facility], [place] [address] [place], [place] [address]   Culture, blood (routine x 2)     Status: Abnormal   Collection Time: 06/13/19 11:53 AM   Specimen: BLOOD LEFT HAND  Result Value Ref Range Status   Specimen Description   Final    BLOOD LEFT HAND Performed at [place] [address] [place], [place] [address]    Special Requests   Final    BOTTLES DRAWN AEROBIC AND ANAEROBIC Blood Culture adequate volume Performed at [facility] [address], [place], [place] [address]    Culture  Setup Time   Final    GRAM POSITIVE COCCI IN CLUSTERS AEROBIC BOTTLE ONLY CRITICAL RESULT CALLED TO,  READ BACK BY AND VERIFIED WITH: PHARMD C SHADE [id number] AT 46 AM BY CM    Culture (A)  Final    STAPHYLOCOCCUS AUREUS SUSCEPTIBILITIES PERFORMED ON PREVIOUS CULTURE WITHIN THE LAST 5 DAYS. Performed at [facility], [place] [address] [place], [place] [address]    Report Status 06/15/2019 FINAL  Final  Culture, blood (routine x 2)     Status: Abnormal   Collection Time: 06/13/19 11:53 AM   Specimen: BLOOD RIGHT HAND  Result Value Ref Range Status   Specimen Description   Final    BLOOD RIGHT HAND Performed at [place] [address] [place], [place] [address]    Special Requests   Final    BOTTLES DRAWN AEROBIC  AND ANAEROBIC Blood Culture results may not be optimal due to an inadequate volume of blood received in culture bottles Performed at [facility], [place] [address], [place], [place] [address]    Culture  Setup Time   Final    GRAM POSITIVE COCCI IN CLUSTERS AEROBIC BOTTLE ONLY CRITICAL RESULT CALLED TO, READ BACK BY AND VERIFIED WITH: PHARMD C SHADE [id number] AT 57 AM BY CM    Culture (A)  Final    STAPHYLOCOCCUS AUREUS SUSCEPTIBILITIES PERFORMED ON PREVIOUS CULTURE WITHIN THE LAST 5 DAYS. Performed at [facility], [place] [address], [place], [place] [address]    Report Status 06/15/2019 FINAL  Final     Labs: BNP (last 3 results) No results for input(s): BNP in the last 8760 hours. Basic Metabolic Panel: Recent Labs  Lab 06/13/19 1153 06/14/19 0838 06/15/19 0756 06/15/19 1421 06/15/19 1520 06/16/19 0500  NA 141 137 138 135  --  140  K 2.6* 2.6* 3.7 3.0*  --  3.9  CL 108 105 106 102  --  108  CO2 24 23 23 23   --  22  GLUCOSE 88 163* 87 153*  --  102*  BUN 17 9 6 7   --  <5*  CREATININE 0.66 0.52 0.48 0.53  --  0.51  CALCIUM 7.6* 7.1* 7.0* 7.1*  --  7.5*  MG 2.5*  --   --   --  2.0  --    Liver Function Tests: Recent Labs  Lab 06/11/19 0005 06/16/19 0500  AST 162* 20  ALT 47* 11  ALKPHOS 139* 72  BILITOT  1.3* 0.9  PROT 7.6 6.7  ALBUMIN 2.0* 1.8*   No results for input(s): LIPASE, AMYLASE in the last 168 hours. No results for input(s): AMMONIA in the last 168 hours. CBC: Recent Labs  Lab 06/11/19 0005 06/12/19 0605 06/13/19 1153 06/14/19 0838 06/15/19 0756 06/15/19 1421 06/16/19 0500  WBC 24.6*   < > 17.3* 17.9* 18.8* 19.5* 19.5*  NEUTROABS 21.1*  --  14.4* 14.2* 14.4* 16.3*  --   HGB 9.4*   < > 9.0* 7.7* 6.5* 6.8* 11.7*  HCT 27.7*   < > 27.1* 24.8* 18.6* 20.9* 35.7*  MCV 78.2*   < > 78.3* 85.5 75.9* 80.4 84.8  PLT 135*   < > 183 188 216 232 268   < > = values in this interval not displayed.   Cardiac Enzymes: Recent Labs  Lab 06/11/19 0232  CKTOTAL 146   BNP: Invalid input(s): POCBNP CBG: No results for input(s): GLUCAP in the last 168 hours. D-Dimer No results for input(s): DDIMER in the last 72 hours. Hgb A1c No results for input(s): HGBA1C in the last 72 hours. Lipid Profile No results for input(s): CHOL, HDL, LDLCALC, TRIG, CHOLHDL, LDLDIRECT in the last 72 hours. Thyroid function studies No results for input(s): TSH, T4TOTAL, T3FREE, THYROIDAB in the last 72 hours.  Invalid input(s): FREET3 Anemia work up Recent Labs    06/15/19 1716  VITAMINB12 1,260*  FOLATE 18.2  FERRITIN 217  TIBC 163*  IRON 17*  RETICCTPCT 2.5   Urinalysis    Component Value Date/Time   COLORURINE AMBER (A) 06/11/2019 0005   APPEARANCEUR CLOUDY (A) 06/11/2019 0005   LABSPEC 1.008 06/11/2019 0005   PHURINE 5.0 06/11/2019 0005   GLUCOSEU NEGATIVE 06/11/2019 0005   HGBUR MODERATE (A) 06/11/2019 0005   BILIRUBINUR NEGATIVE 06/11/2019 0005   KETONESUR NEGATIVE 06/11/2019 0005   PROTEINUR 30 (A) 06/11/2019 0005  NITRITE NEGATIVE 06/11/2019 0005   LEUKOCYTESUR LARGE (A) 06/11/2019 0005   Sepsis Labs Invalid input(s): PROCALCITONIN,  WBC,  LACTICIDVEN Microbiology Recent Results (from the past 240 hour(s))  Culture, blood (Routine x 2)     Status: Abnormal   Collection  Time: 06/11/19 12:05 AM   Specimen: BLOOD  Result Value Ref Range Status   Specimen Description   Final    BLOOD BLOOD LEFT HAND Performed at [facility], [place] [address] [place], [place] [address]    Special Requests   Final    BOTTLES DRAWN AEROBIC ONLY Blood Culture adequate volume Performed at [place] [address], [place], [place] [address]    Culture  Setup Time   Final    GRAM POSITIVE COCCI IN CLUSTERS AEROBIC BOTTLE ONLY CRITICAL VALUE NOTED.  VALUE IS CONSISTENT WITH PREVIOUSLY REPORTED AND CALLED VALUE.    Culture (A)  Final    STAPHYLOCOCCUS AUREUS SUSCEPTIBILITIES PERFORMED ON PREVIOUS CULTURE WITHIN THE LAST 5 DAYS. Performed at [facility], [place] [address], [place], [place] [address]    Report Status 06/13/2019 FINAL  Final  Urine culture     Status: Abnormal   Collection Time: 06/11/19 12:05 AM   Specimen: In/Out Cath Urine  Result Value Ref Range Status   Specimen Description   Final    IN/OUT CATH URINE Performed at [place] [address], [place], [place] [address]    Special Requests   Final    NONE Performed at [facility], [place] [address] [place], [place] [address]    Culture (A)  Final    >=100,000 COLONIES/mL KOCURIA SPECIES 10,000 COLONIES/mL STAPHYLOCOCCUS AUREUS Standardized susceptibility testing for this organism is not available. FOR KOCURIA SPECIES Performed at [facility], [place] [address], [place], [place] [address]    Report Status 06/13/2019 FINAL  Final   Organism ID, Bacteria STAPHYLOCOCCUS AUREUS (A)  Final      Susceptibility   Staphylococcus aureus - MIC*    CIPROFLOXACIN >=8 RESISTANT Resistant     GENTAMICIN <=0.5 SENSITIVE Sensitive     NITROFURANTOIN <=16 SENSITIVE Sensitive     OXACILLIN 0.5 SENSITIVE Sensitive     TETRACYCLINE <=1 SENSITIVE Sensitive     VANCOMYCIN 1 SENSITIVE Sensitive     TRIMETH/SULFA <=10 SENSITIVE  Sensitive     CLINDAMYCIN <=0.25 SENSITIVE Sensitive     RIFAMPIN <=0.5 SENSITIVE Sensitive     Inducible Clindamycin NEGATIVE Sensitive     * 10,000 COLONIES/mL STAPHYLOCOCCUS AUREUS  Culture, blood (Routine x 2)     Status: Abnormal   Collection Time: 06/11/19 12:10 AM   Specimen: BLOOD  Result Value Ref Range Status   Specimen Description   Final    BLOOD BLOOD RIGHT FOREARM Performed at [place] [address] [place], [place] [address]    Special Requests   Final    BOTTLES DRAWN AEROBIC ONLY Blood Culture adequate volume Performed at [facility] [address] [place], [place] [address]    Culture  Setup Time   Final    GRAM POSITIVE COCCI IN CLUSTERS AEROBIC BOTTLE ONLY CRITICAL RESULT CALLED TO, READ BACK BY AND VERIFIED WITH: Osino [id number] FCP Performed at [facility], [place] [address], [place], [place] [address]    Culture STAPHYLOCOCCUS AUREUS (A)  Final   Report Status 06/13/2019 FINAL  Final   Organism ID, Bacteria STAPHYLOCOCCUS  AUREUS  Final      Susceptibility   Staphylococcus aureus - MIC*    CIPROFLOXACIN >=8 RESISTANT Resistant     ERYTHROMYCIN >=8 RESISTANT Resistant     GENTAMICIN <=0.5 SENSITIVE Sensitive     OXACILLIN 0.5 SENSITIVE Sensitive     TETRACYCLINE <=1 SENSITIVE Sensitive     VANCOMYCIN 1 SENSITIVE Sensitive     TRIMETH/SULFA <=10 SENSITIVE Sensitive     CLINDAMYCIN <=0.25 SENSITIVE Sensitive     RIFAMPIN <=0.5 SENSITIVE Sensitive     Inducible Clindamycin NEGATIVE Sensitive     * STAPHYLOCOCCUS AUREUS  Blood Culture ID Panel (Reflexed)     Status: Abnormal   Collection Time: 06/11/19 12:10 AM  Result Value Ref Range Status   Enterococcus species NOT DETECTED NOT DETECTED Final   Listeria monocytogenes NOT DETECTED NOT DETECTED Final   Staphylococcus species DETECTED (A) NOT DETECTED Final    Comment: CRITICAL RESULT CALLED TO, READ BACK BY AND VERIFIED WITH: PHARMD  MICHELLE B. [id number] FCP    Staphylococcus aureus (BCID) DETECTED (A) NOT DETECTED Final    Comment: Methicillin (oxacillin) susceptible Staphylococcus aureus (MSSA). Preferred therapy is anti staphylococcal beta lactam antibiotic (Cefazolin or Nafcillin), unless clinically contraindicated. CRITICAL RESULT CALLED TO, READ BACK BY AND VERIFIED WITH: PHARMD MICHELLE B. [id number] FCP    Methicillin resistance NOT DETECTED NOT DETECTED Final   Streptococcus species NOT DETECTED NOT DETECTED Final   Streptococcus agalactiae NOT DETECTED NOT DETECTED Final   Streptococcus pneumoniae NOT DETECTED NOT DETECTED Final   Streptococcus pyogenes NOT DETECTED NOT DETECTED Final   Acinetobacter baumannii NOT DETECTED NOT DETECTED Final   Enterobacteriaceae species NOT DETECTED NOT DETECTED Final   Enterobacter cloacae complex NOT DETECTED NOT DETECTED Final   Escherichia coli NOT DETECTED NOT DETECTED Final   Klebsiella oxytoca NOT DETECTED NOT DETECTED Final   Klebsiella pneumoniae NOT DETECTED NOT DETECTED Final   Proteus species NOT DETECTED NOT DETECTED Final   Serratia marcescens NOT DETECTED NOT DETECTED Final   Haemophilus influenzae NOT DETECTED NOT DETECTED Final   Neisseria meningitidis NOT DETECTED NOT DETECTED Final   Pseudomonas aeruginosa NOT DETECTED NOT DETECTED Final   Candida albicans NOT DETECTED NOT DETECTED Final   Candida glabrata NOT DETECTED NOT DETECTED Final   Candida krusei NOT DETECTED NOT DETECTED Final   Candida parapsilosis NOT DETECTED NOT DETECTED Final   Candida tropicalis NOT DETECTED NOT DETECTED Final    Comment: Performed at [facility], [place] [address], [place], [place] [address]  SARS CORONAVIRUS 2 (TAT 6-24 HRS) Nasopharyngeal Nasopharyngeal Swab     Status: None   Collection Time: 06/11/19 12:52 AM   Specimen: Nasopharyngeal Swab  Result Value Ref Range Status   SARS Coronavirus 2 NEGATIVE NEGATIVE Final    Comment: (NOTE) SARS-CoV-2 target  nucleic acids are NOT DETECTED. The SARS-CoV-2 RNA is generally detectable in upper and lower respiratory specimens during the acute phase of infection. Negative results do not preclude SARS-CoV-2 infection, do not rule out co-infections with other pathogens, and should not be used as the sole basis for treatment or other patient management decisions. Negative results must be combined with clinical observations, patient history, and epidemiological information. The expected result is Negative. Fact Sheet for Patients: SugarRoll.be Fact Sheet for Healthcare Providers: https://www.woods-mathews.com/ This test is not yet approved or cleared by the [place] FDA and  has been authorized for detection and/or diagnosis of SARS-CoV-2 by FDA under an Emergency Use Authorization (EUA). This EUA  will remain  in effect (meaning this test can be used) for the duration of the COVID-19 declaration under Section 56 4(b)(1) of the Act, 21 U.S.C. section 360bbb-3(b)(1), unless the authorization is terminated or revoked sooner. Performed at [facility], [place] [address] [place], [place] [address]   Respiratory Panel by RT PCR (Flu A&B, Covid) - Nasopharyngeal Swab     Status: None   Collection Time: 06/11/19  5:25 AM   Specimen: Nasopharyngeal Swab  Result Value Ref Range Status   SARS Coronavirus 2 by RT PCR NEGATIVE NEGATIVE Final    Comment: (NOTE) SARS-CoV-2 target nucleic acids are NOT DETECTED. The SARS-CoV-2 RNA is generally detectable in upper respiratoy specimens during the acute phase of infection. The lowest concentration of SARS-CoV-2 viral copies this assay can detect is 131 copies/mL. A negative result does not preclude SARS-Cov-2 infection and should not be used as the sole basis for treatment or other patient management decisions. A negative result may occur with  improper specimen collection/handling, submission of specimen  other than nasopharyngeal swab, presence of viral mutation(s) within the areas targeted by this assay, and inadequate number of viral copies (<131 copies/mL). A negative result must be combined with clinical observations, patient history, and epidemiological information. The expected result is Negative. Fact Sheet for Patients:  PinkCheek.be Fact Sheet for Healthcare Providers:  GravelBags.it This test is not yet ap proved or cleared by the [place] FDA and  has been authorized for detection and/or diagnosis of SARS-CoV-2 by FDA under an Emergency Use Authorization (EUA). This EUA will remain  in effect (meaning this test can be used) for the duration of the COVID-19 declaration under Section 564(b)(1) of the Act, 21 U.S.C. section 360bbb-3(b)(1), unless the authorization is terminated or revoked sooner.    Influenza A by PCR NEGATIVE NEGATIVE Final   Influenza B by PCR NEGATIVE NEGATIVE Final    Comment: (NOTE) The Xpert Xpress SARS-CoV-2/FLU/RSV assay is intended as an aid in  the diagnosis of influenza from Nasopharyngeal swab specimens and  should not be used as a sole basis for treatment. Nasal washings and  aspirates are unacceptable for Xpert Xpress SARS-CoV-2/FLU/RSV  testing. Fact Sheet for Patients: PinkCheek.be Fact Sheet for Healthcare Providers: GravelBags.it This test is not yet approved or cleared by the [place] FDA and  has been authorized for detection and/or diagnosis of SARS-CoV-2 by  FDA under an Emergency Use Authorization (EUA). This EUA will remain  in effect (meaning this test can be used) for the duration of the  Covid-19 declaration under Section 564(b)(1) of the Act, 21  U.S.C. section 360bbb-3(b)(1), unless the authorization is  terminated or revoked. Performed at [facility], [place] [address] [place], [place] [address]   Body fluid culture     Status: None   Collection Time: 06/11/19  2:03 PM   Specimen: Synovium; Synovial Fluid  Result Value Ref Range Status   Specimen Description SYNOVIAL FLUID KNEE LEFT  Final   Special Requests   Final    NONE Performed at [place] [address], [place], [place] [address]    Gram Stain   Final    ABUNDANT WBC PRESENT,BOTH PMN AND MONONUCLEAR NO ORGANISMS SEEN Gram Stain Report Called to,Read Back By and Verified With: DR. Lyla Glassing AT 1512 ON 06/11/19 BY N.THOMPSON Performed at [facility], [place] [address], [place], [place] [address]    Culture   Final    NO GROWTH  3 DAYS Performed at [facility], [place] [address] [place], [place] [address]    Report Status 06/14/2019 FINAL  Final  Anaerobic culture     Status: None   Collection Time: 06/11/19  2:03 PM   Specimen: Synovium; Synovial Fluid  Result Value Ref Range Status   Specimen Description SYNOVIAL FLUID KNEE LEFT  Final   Special Requests   Final    NONE Performed at [place] [address], [place], [place] [address]    Culture   Final    NO ANAEROBES ISOLATED Performed at [facility], [place] [address] [place], [place] [address]    Report Status 06/16/2019 FINAL  Final  Aerobic/Anaerobic Culture (surgical/deep wound)     Status: None   Collection Time: 06/11/19  6:30 PM   Specimen: Synovial, Left Knee; Body Fluid  Result Value Ref Range Status   Specimen Description   Final    SYNOVIAL KNEE LEFT Performed at [place] [address], [place], [place] [address]    Special Requests   Final    NONE Performed at [facility], [place] [address], [place], [place] [address]    Gram Stain   Final    RARE WBC PRESENT, PREDOMINANTLY PMN NO ORGANISMS SEEN    Culture   Final    RARE STAPHYLOCOCCUS AUREUS NO ANAEROBES ISOLATED Performed at [facility], [place] [address] [place], [place] [address]    Report Status 06/16/2019 FINAL  Final   Organism ID, Bacteria STAPHYLOCOCCUS AUREUS  Final      Susceptibility   Staphylococcus aureus - MIC*    CIPROFLOXACIN >=8 RESISTANT Resistant     ERYTHROMYCIN >=8 RESISTANT Resistant     GENTAMICIN <=0.5 SENSITIVE Sensitive     OXACILLIN 0.5 SENSITIVE Sensitive     TETRACYCLINE <=1 SENSITIVE Sensitive     VANCOMYCIN <=0.5 SENSITIVE Sensitive     TRIMETH/SULFA <=10 SENSITIVE Sensitive     CLINDAMYCIN <=0.25 SENSITIVE Sensitive     RIFAMPIN <=0.5 SENSITIVE Sensitive     Inducible Clindamycin NEGATIVE Sensitive     * RARE STAPHYLOCOCCUS AUREUS  Aerobic/Anaerobic Culture (surgical/deep wound)     Status: None (Preliminary result)   Collection Time: 06/11/19  6:32 PM   Specimen: Synovium; Tissue  Result Value Ref Range Status   Specimen Description   Final    SYNOVIAL TISSUE KNEE LEFT Performed at [place] [address] [place], [place] [address]    Special Requests   Final    NONE Performed at [facility], [place] [address] [place], [place] [address]    Gram Stain   Final    NO WBC SEEN NO ORGANISMS SEEN Performed at [facility], [place] [address], [place],  [address]    Culture RARE STAPHYLOCOCCUS AUREUS  Final   Report Status PENDING  Incomplete   Organism ID, Bacteria STAPHYLOCOCCUS AUREUS  Final      Susceptibility   Staphylococcus aureus - MIC*    CIPROFLOXACIN >=8 RESISTANT Resistant     ERYTHROMYCIN >=8 RESISTANT Resistant     GENTAMICIN <=0.5 SENSITIVE Sensitive     OXACILLIN 0.5 SENSITIVE Sensitive     TETRACYCLINE <=1 SENSITIVE Sensitive     VANCOMYCIN 1 SENSITIVE Sensitive     TRIMETH/SULFA <=10 SENSITIVE Sensitive     CLINDAMYCIN <=0.25 SENSITIVE Sensitive     RIFAMPIN <=0.5 SENSITIVE Sensitive     Inducible Clindamycin  NEGATIVE Sensitive     * RARE STAPHYLOCOCCUS AUREUS  Surgical PCR screen     Status:  Abnormal   Collection Time: 06/11/19  8:49 PM   Specimen: Nasal Mucosa; Nasal Swab  Result Value Ref Range Status   MRSA, PCR NEGATIVE NEGATIVE Final   Staphylococcus aureus POSITIVE (A) NEGATIVE Final    Comment: (NOTE) The Xpert SA Assay (FDA approved for NASAL specimens in patients 61 years of age and older), is one component of a comprehensive surveillance program. It is not intended to diagnose infection nor to guide or monitor treatment. Performed at [facility], [place] [address] [place], [place] [address]   Culture, blood (routine x 2)     Status: Abnormal   Collection Time: 06/13/19 11:53 AM   Specimen: BLOOD LEFT HAND  Result Value Ref Range Status   Specimen Description   Final    BLOOD LEFT HAND Performed at [place] [address], [place], [place] [address]    Special Requests   Final    BOTTLES DRAWN AEROBIC AND ANAEROBIC Blood Culture adequate volume Performed at [place] [address], [place], [place] [address]    Culture  Setup Time   Final    GRAM POSITIVE COCCI IN CLUSTERS AEROBIC BOTTLE ONLY CRITICAL RESULT CALLED TO, READ BACK BY AND VERIFIED WITH: PHARMD C SHADE [id number] AT 18 AM BY CM    Culture (A)  Final    STAPHYLOCOCCUS AUREUS SUSCEPTIBILITIES PERFORMED ON PREVIOUS CULTURE WITHIN THE LAST 5 DAYS. Performed at [facility], [place] [address] [place], [place] [address]    Report Status 06/15/2019 FINAL  Final  Culture, blood (routine x 2)     Status: Abnormal   Collection Time: 06/13/19 11:53 AM   Specimen: BLOOD RIGHT HAND  Result Value Ref Range Status   Specimen Description   Final    BLOOD RIGHT HAND Performed at [facility] [address], [place], [place] [address]    Special Requests   Final    BOTTLES DRAWN AEROBIC AND ANAEROBIC Blood Culture results may not be optimal due to an inadequate volume of blood received in culture bottles Performed at  [place] [address] [place], [place] [address]    Culture  Setup Time   Final    GRAM POSITIVE COCCI IN CLUSTERS AEROBIC BOTTLE ONLY CRITICAL RESULT CALLED TO, READ BACK BY AND VERIFIED WITH: PHARMD C SHADE [id number] AT 64 AM BY CM    Culture (A)  Final    STAPHYLOCOCCUS AUREUS SUSCEPTIBILITIES PERFORMED ON PREVIOUS CULTURE WITHIN THE LAST 5 DAYS. Performed at [facility], [place] [address] [place], [place] [address]    Report Status 06/15/2019 FINAL  Final    Please note: You were cared for by a hospitalist during your hospital stay. Once you are discharged, your primary care physician will handle any further medical issues. Please note that NO REFILLS for any discharge medications will be authorized once you are discharged, as it is imperative that you return to your primary care physician (or establish a relationship with a primary care physician if you do not have one) for your post hospital discharge needs so that they can reassess your need for medications and monitor your lab values.    Time coordinating discharge: 40 minutes  SIGNED:   Shelly Coss, MD  [facility] 06/16/2019, 2:06 PM Pager [phone]  If 7PM-7AM, please contact night-coverage [email or web address]  Password [email or web address]

## 2019-06-16 NOTE — Progress Notes (Signed)
[name]    Date of Admission:  06/15/2019   Total days of antibiotics 7   ID: Michelle Doyle is a 29 y.o. 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 Received blood transfusion yesterday now hgb up to 11.7 from 6.8  Medications:  . enoxaparin (LOVENOX) injection  40 mg Subcutaneous Q24H  . methadone  10 mg Oral Q12H  . nicotine  21 mg Transdermal Daily  . saccharomyces boulardii  250 mg Oral BID    Objective: Vital signs in last 24 hours: Temp:  [98.1 F (36.7 C)-101.3 F (38.5 C)] 98.2 F (36.8 C) (04/13 1315) Pulse Rate:  [82-113] 108 (04/13 1315) Resp:  [13-27] 20 (04/13 1552) BP: (96-126)/(53-87) 106/85 (04/13 1315) SpO2:  [97 %-100 %] 100 % (04/13 1315) Weight:  [49.9 kg-65.2 kg] 65.2 kg (04/13 0816) Physical Exam  Constitutional:  oriented to person, place, and time. appears well-developed and well-nourished. No distress.  HENT: [place]/AT, PERRLA, no scleral icterus Mouth/Throat: Oropharynx is clear and moist. No oropharyngeal exudate.  Cardiovascular: Normal rate, regular rhythm and normal heart sounds. Exam reveals no gallop and no friction rub.  No murmur heard.  Pulmonary/Chest: Effort normal and breath sounds normal. No respiratory distress.  has no wheezes.  Neck = supple, no nuchal rigidity Abdominal: Soft. Bowel sounds are normal.  exhibits no distension. There is no tenderness.  Lymphadenopathy: no cervical adenopathy. No axillary adenopathy Neurological: alert and oriented to person, place, and time.    Skin: Skin is warm and dry. Left leg eschar, osler's node Psychiatric: a normal mood and affect.  behavior is normal.    Lab Results Recent Labs    06/15/19 1421 06/16/19 0500  WBC 19.5* 19.5*  HGB 6.8* 11.7*  HCT 20.9* 35.7*  NA 135 140  K 3.0* 3.9  CL 102 108  CO2 23 22  BUN 7 <5*  CREATININE 0.53 0.51   Liver Panel Recent Labs    06/16/19 0500  PROT 6.7  ALBUMIN 1.8*  AST 20  ALT 11  ALKPHOS 72  BILITOT 0.9   Sedimentation Rate Recent Labs    06/14/19 0737  ESRSEDRATE 63*   C-Reactive Protein Recent Labs    06/14/19 0737 06/16/19 0500  CRP 16.8* 22.2*    Microbiology: 4/8 blood cx MSSA 4/10 blood cx MSSA Studies/Results: TTE: 1. Left ventricular ejection fraction, by estimation, is 60 to 65%. The  left ventricle has normal function. The left ventricle has no regional  wall motion abnormalities. Left ventricular diastolic parameters were  normal.  2. Right ventricular systolic function is normal. The right ventricular  size is normal. There is normal pulmonary artery systolic pressure.  3. Thickened mitral valve. Can not exclude vegetation. . The mitral valve  is abnormal. Mild to moderate mitral valve regurgitation. No evidence of  mitral stenosis.  4. Thickened tricuspid valve, 1.1 x 1.7 cm vegetation. . The tricuspid  valve is abnormal. Tricuspid valve regurgitation is mild to moderate.  5. The aortic valve is normal in structure. Aortic valve regurgitation is  not visualized.  No aortic stenosis is present.  6. The inferior vena cava is normal in size with greater than 50%  respiratory variability, suggesting right atrial pressure of 3 mmHg.   CT of chest/abd: Lungs/Pleura: Numerous and generalized nodular ground-glass and consolidative opacities with variable cavitation. Largest cavities are measured at the right apex (2.4 cm) and lateral right base (2.6 cm). Borderline interlobular septal thickening/edema. No effusion  or pneumothorax.  Musculoskeletal: No acute finding  CT ABDOMEN PELVIS FINDINGS  Hepatobiliary: No focal liver abnormality.No evidence of biliary obstruction or stone.  Pancreas: Unremarkable.  Spleen: Generous size with 2 hypoenhancing wedges within the subcapsular upper and lower spleen. 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 No need to TEE at this time since don't want to risk her leaving AMA again. currently but would recommend repeat echo at end of therapy or sooner if persistently febrile/not clearing bacteremia. Will repeat blood cx today to see if she has cleared bacteremia.   Fevers = likely due to having large disease burden  Septic pulmonary emboli/cavitary pneumonia = 2/2 MSSA infection. Continue with nafcillin  Hx of opiate use = currently on methadone  left knee septic arthritis = would be covered with the current abtx course. Would need at least 4 wk of iv therapy  [facility] Cell: [phone] Pager: [phone]  06/16/2019, 5:20 PM

## 2019-06-16 NOTE — Progress Notes (Signed)
PROGRESS NOTE    Michelle Doyle  [id number] DOB: 04/28/1991 DOA: 06/15/2019 PCP: Patient, No Pcp Per   Brief Narrative: 29 year old with PMH significant for IV heroin abuse, Untreated Hepatitis C, initially admitted for acute heroin withdrawal and sepsis. Patient was transfer to [facility] 4/12 for TEE, but it was cancel due to severe anemia, Hb at 6. Patient Decided left AMA from [facility] Her Grandfather brought her back to [place] yesterday.   Patient was found to have MSSA Bacteremia.    Assessment & Plan:   Active Problems:   Endocarditis   Hypokalemia   Septic arthritis ([place])   Splenic infarct   1-MSSA Bacteremia; -Continue with IV Nafcillin.  -Will get MRI brain as recommend by ID to rule out septic emboli.  -ECHO showed: Thickened Tricuspid valve 1.1 x 1.7 cm vegetation, tricuspid valve mild to moderate rehabilitation, thickened mitral valve, cannot rule out vegetation.  Mitral valve is abnormal. -Blood cultures 4/8 growing Staph aureus. 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  3-Septic emboli/splenic infarct: Continue with antibiotics.  4-polysubstance abuse, IV heroin and amphetamine use: Patient report that she takes methadone 35mg  daily. Pharmacy will try to find if patient was on that dose. We will start methadone 10 mg twice daily  5-Left knee septic arthritis: Plan irrigation and debridement of the left knee on 10/11/2019 by Dr. Lyla Glassing.  Per ortho.   6-untreated hepatitis C: Follow-up hepatitis C RNA  Hypokalemia: Resolved.  Thrombocytopenia: Resolved.  Anemia: Iron deficiency anemia. received 2 units of packed red blood cells on 4/12. Hemoglobin increased to 11. Start iron supplement,  when infection is controlled  Estimated body mass index is 22.51 kg/m as calculated from the following:   Height as of this encounter: 5\' 7"  (1.702 m).   Weight as of this encounter: 65.2 kg.   DVT prophylaxis: Lovenox Code Status: Full code Family Communication: Grandfather at bedside Disposition Plan:  Patient is from: Home Anticipated d/c date: unknown, she will required 6 weeks IV antibiotics, anticipate difficult placement.  Barriers to d/c or necessity for inpatient status: remain in the  Hospital for IV antibiotics.   Consultants:   ID  Ortho  Procedures:   ECHO  Antimicrobials:  Nafcicillin  Subjective: She is having pain.  She needs her methadone  Objective: Vitals:   06/16/19 0450 06/16/19 0723 06/16/19 0740 06/16/19 0816  BP: 112/76  122/80 126/83  Pulse: 82  89 91  Resp: 17  16 20   Temp: 98.7 F (37.1 C) 98.1 F (36.7 C)  99.6 F (37.6 C)  TempSrc: Oral Oral  Oral  SpO2: 99%  98% 99%  Weight:    65.2 kg  Height:    5\' 7"  (1.702 m)    Intake/Output Summary (Last 24 hours) at 06/16/2019 1122 Last data filed at 06/16/2019 0830 Gross per 24 hour  Intake 2803.23 ml  Output -  Net 2803.23 ml   Filed Weights   06/16/19 0110 06/16/19 0816  Weight: 49.9 kg 65.2 kg    Examination:  General exam: Appears calm and comfortable  Respiratory system: Clear to auscultation. Respiratory effort normal. Cardiovascular system: S1 & S2 heard, RRR. No JVD, murmurs, rubs, gallops or clicks. No pedal edema. Gastrointestinal system: Abdomen is nondistended, soft  and nontender. No organomegaly or masses felt. Normal bowel sounds heard. Central nervous system: Alert and oriented. No focal neurological deficits. Extremities: Symmetric 5 x 5 power.   Data Reviewed: I have personally reviewed following labs and imaging studies  CBC: Recent Labs  Lab 06/11/19 0005 06/12/19 0605 06/13/19 1153 06/14/19 0838 06/15/19 0756 06/15/19 1421 06/16/19 0500  WBC  24.6*   < > 17.3* 17.9* 18.8* 19.5* 19.5*  NEUTROABS 21.1*  --  14.4* 14.2* 14.4* 16.3*  --   HGB 9.4*   < > 9.0* 7.7* 6.5* 6.8* 11.7*  HCT 27.7*   < > 27.1* 24.8* 18.6* 20.9* 35.7*  MCV 78.2*   < > 78.3* 85.5 75.9* 80.4 84.8  PLT 135*   < > 183 188 216 232 268   < > = values in this interval not displayed.   Basic Metabolic Panel: Recent Labs  Lab 06/13/19 1153 06/14/19 0838 06/15/19 0756 06/15/19 1421 06/15/19 1520 06/16/19 0500  NA 141 137 138 135  --  140  K 2.6* 2.6* 3.7 3.0*  --  3.9  CL 108 105 106 102  --  108  CO2 24 23 23 23   --  22  GLUCOSE 88 163* 87 153*  --  102*  BUN 17 9 6 7   --  <5*  CREATININE 0.66 0.52 0.48 0.53  --  0.51  CALCIUM 7.6* 7.1* 7.0* 7.1*  --  7.5*  MG 2.5*  --   --   --  2.0  --    GFR: Estimated Creatinine Clearance: 103.6 mL/min (by C-G formula based on SCr of 0.51 mg/dL). Liver Function Tests: Recent Labs  Lab 06/11/19 0005 06/16/19 0500  AST 162* 20  ALT 47* 11  ALKPHOS 139* 72  BILITOT 1.3* 0.9  PROT 7.6 6.7  ALBUMIN 2.0* 1.8*   No results for input(s): LIPASE, AMYLASE in the last 168 hours. No results for input(s): AMMONIA in the last 168 hours. Coagulation Profile: Recent Labs  Lab 06/11/19 0005  INR 1.2   Cardiac Enzymes: Recent Labs  Lab 06/11/19 0232  CKTOTAL 146   BNP (last 3 results) No results for input(s): PROBNP in the last 8760 hours. HbA1C: No results for input(s): HGBA1C in the last 72 hours. CBG: No results for input(s): GLUCAP in the last 168 hours. Lipid Profile: No results for input(s): CHOL, HDL, LDLCALC, TRIG, CHOLHDL, LDLDIRECT in the last 72 hours. Thyroid Function Tests: No results for input(s): TSH, T4TOTAL, FREET4, T3FREE, THYROIDAB in the last 72 hours. Anemia Panel: Recent Labs    06/15/19 1716  VITAMINB12 1,260*  FOLATE 18.2  FERRITIN 217  TIBC 163*  IRON 17*  RETICCTPCT 2.5   Sepsis Labs: Recent Labs  Lab 06/11/19 0232 06/12/19 0849 06/13/19 1153 06/15/19 1540   LATICACIDVEN 1.9 3.0* 2.2* 1.8    Recent Results (from the past 240 hour(s))  Culture, blood (Routine x 2)     Status: Abnormal   Collection Time: 06/11/19 12:05 AM   Specimen: BLOOD  Result Value Ref Range Status   Specimen Description   Final    BLOOD BLOOD LEFT HAND Performed at [facility] [address] [place], [place] [address]    Special Requests   Final    BOTTLES DRAWN AEROBIC ONLY Blood Culture adequate volume Performed at [facility] [address], [place], [place] [address]    Culture  Setup Time   Final    GRAM POSITIVE COCCI IN CLUSTERS AEROBIC  BOTTLE ONLY CRITICAL VALUE NOTED.  VALUE IS CONSISTENT WITH PREVIOUSLY REPORTED AND CALLED VALUE.    Culture (A)  Final    STAPHYLOCOCCUS AUREUS SUSCEPTIBILITIES PERFORMED ON PREVIOUS CULTURE WITHIN THE LAST 5 DAYS. Performed at [facility], [place] [address], [place], [place] [address]    Report Status 06/13/2019 FINAL  Final  Urine culture     Status: Abnormal   Collection Time: 06/11/19 12:05 AM   Specimen: In/Out Cath Urine  Result Value Ref Range Status   Specimen Description   Final    IN/OUT CATH URINE Performed at [facility] [address] [place], [place] [address]    Special Requests   Final    NONE Performed at [facility], [place] [address], [place], [place] [address]    Culture (A)  Final    >=100,000 COLONIES/mL KOCURIA SPECIES 10,000 COLONIES/mL STAPHYLOCOCCUS AUREUS Standardized susceptibility testing for this organism is not available. FOR KOCURIA SPECIES Performed at [facility], [place] [address], [place], [place] [address]    Report Status 06/13/2019 FINAL  Final   Organism ID, Bacteria STAPHYLOCOCCUS AUREUS (A)  Final      Susceptibility   Staphylococcus aureus - MIC*    CIPROFLOXACIN >=8 RESISTANT Resistant     GENTAMICIN <=0.5 SENSITIVE Sensitive     NITROFURANTOIN <=16 SENSITIVE Sensitive      OXACILLIN 0.5 SENSITIVE Sensitive     TETRACYCLINE <=1 SENSITIVE Sensitive     VANCOMYCIN 1 SENSITIVE Sensitive     TRIMETH/SULFA <=10 SENSITIVE Sensitive     CLINDAMYCIN <=0.25 SENSITIVE Sensitive     RIFAMPIN <=0.5 SENSITIVE Sensitive     Inducible Clindamycin NEGATIVE Sensitive     * 10,000 COLONIES/mL STAPHYLOCOCCUS AUREUS  Culture, blood (Routine x 2)     Status: Abnormal   Collection Time: 06/11/19 12:10 AM   Specimen: BLOOD  Result Value Ref Range Status   Specimen Description   Final    BLOOD BLOOD RIGHT FOREARM Performed at [facility] [address] [place], [place] [address]    Special Requests   Final    BOTTLES DRAWN AEROBIC ONLY Blood Culture adequate volume Performed at [facility] [address], [place], [place] [address]    Culture  Setup Time   Final    GRAM POSITIVE COCCI IN CLUSTERS AEROBIC BOTTLE ONLY CRITICAL RESULT CALLED TO, READ BACK BY AND VERIFIED WITH: [place] [id number] FCP Performed at [facility], [place] [address], [place],  [address]    Culture STAPHYLOCOCCUS AUREUS (A)  Final   Report Status 06/13/2019 FINAL  Final   Organism ID, Bacteria STAPHYLOCOCCUS AUREUS  Final      Susceptibility   Staphylococcus aureus - MIC*    CIPROFLOXACIN >=8 RESISTANT Resistant     ERYTHROMYCIN >=8 RESISTANT Resistant     GENTAMICIN <=0.5 SENSITIVE Sensitive     OXACILLIN 0.5 SENSITIVE Sensitive     TETRACYCLINE <=1 SENSITIVE Sensitive     VANCOMYCIN 1 SENSITIVE Sensitive     TRIMETH/SULFA <=10 SENSITIVE Sensitive     CLINDAMYCIN <=0.25 SENSITIVE Sensitive     RIFAMPIN <=0.5 SENSITIVE Sensitive     Inducible Clindamycin NEGATIVE Sensitive     * STAPHYLOCOCCUS AUREUS  Blood Culture ID Panel (Reflexed)     Status: Abnormal   Collection Time: 06/11/19 12:10 AM  Result Value Ref Range Status   Enterococcus species NOT DETECTED NOT DETECTED Final   Listeria  monocytogenes NOT DETECTED NOT DETECTED  Final   Staphylococcus species DETECTED (A) NOT DETECTED Final    Comment: CRITICAL RESULT CALLED TO, READ BACK BY AND VERIFIED WITH: PHARMD MICHELLE B. [id number] FCP    Staphylococcus aureus (BCID) DETECTED (A) NOT DETECTED Final    Comment: Methicillin (oxacillin) susceptible Staphylococcus aureus (MSSA). Preferred therapy is anti staphylococcal beta lactam antibiotic (Cefazolin or Nafcillin), unless clinically contraindicated. CRITICAL RESULT CALLED TO, READ BACK BY AND VERIFIED WITH: PHARMD MICHELLE B. [id number] FCP    Methicillin resistance NOT DETECTED NOT DETECTED Final   Streptococcus species NOT DETECTED NOT DETECTED Final   Streptococcus agalactiae NOT DETECTED NOT DETECTED Final   Streptococcus pneumoniae NOT DETECTED NOT DETECTED Final   Streptococcus pyogenes NOT DETECTED NOT DETECTED Final   Acinetobacter baumannii NOT DETECTED NOT DETECTED Final   Enterobacteriaceae species NOT DETECTED NOT DETECTED Final   Enterobacter cloacae complex NOT DETECTED NOT DETECTED Final   Escherichia coli NOT DETECTED NOT DETECTED Final   Klebsiella oxytoca NOT DETECTED NOT DETECTED Final   Klebsiella pneumoniae NOT DETECTED NOT DETECTED Final   Proteus species NOT DETECTED NOT DETECTED Final   Serratia marcescens NOT DETECTED NOT DETECTED Final   Haemophilus influenzae NOT DETECTED NOT DETECTED Final   Neisseria meningitidis NOT DETECTED NOT DETECTED Final   Pseudomonas aeruginosa NOT DETECTED NOT DETECTED Final   Candida albicans NOT DETECTED NOT DETECTED Final   Candida glabrata NOT DETECTED NOT DETECTED Final   Candida krusei NOT DETECTED NOT DETECTED Final   Candida parapsilosis NOT DETECTED NOT DETECTED Final   Candida tropicalis NOT DETECTED NOT DETECTED Final    Comment: Performed at [facility], [place] [address] [place], [place] [address]  SARS CORONAVIRUS 2 (TAT 6-24 HRS) Nasopharyngeal Nasopharyngeal Swab     Status: None   Collection Time: 06/11/19 12:52 AM    Specimen: Nasopharyngeal Swab  Result Value Ref Range Status   SARS Coronavirus 2 NEGATIVE NEGATIVE Final    Comment: (NOTE) SARS-CoV-2 target nucleic acids are NOT DETECTED. The SARS-CoV-2 RNA is generally detectable in upper and lower respiratory specimens during the acute phase of infection. Negative results do not preclude SARS-CoV-2 infection, do not rule out co-infections with other pathogens, and should not be used as the sole basis for treatment or other patient management decisions. Negative results must be combined with clinical observations, patient history, and epidemiological information. The expected result is Negative. Fact Sheet for Patients: SugarRoll.be Fact Sheet for Healthcare Providers: https://www.woods-mathews.com/ This test is not yet approved or cleared by the [place] FDA and  has been authorized for detection and/or diagnosis of SARS-CoV-2 by FDA under an Emergency Use Authorization (EUA). This EUA will remain  in effect (meaning this test can be used) for the duration of the COVID-19 declaration under Section 56 4(b)(1) of the Act, 21 U.S.C. section 360bbb-3(b)(1), unless the authorization is terminated or revoked sooner. Performed at [facility], [place] [address], [place], [place] [address]   Respiratory Panel by RT PCR (Flu A&B, Covid) - Nasopharyngeal Swab     Status: None   Collection Time: 06/11/19  5:25 AM   Specimen: Nasopharyngeal Swab  Result Value Ref Range Status   SARS Coronavirus 2 by RT PCR NEGATIVE NEGATIVE Final    Comment: (NOTE) SARS-CoV-2 target nucleic acids are NOT DETECTED. The SARS-CoV-2 RNA is generally detectable in upper respiratoy specimens during the acute phase of infection. The lowest concentration of SARS-CoV-2 viral copies this assay can detect  is 131 copies/mL. A negative result does not preclude SARS-Cov-2 infection and should not be used as the sole basis for  treatment or other patient management decisions. A negative result may occur with  improper specimen collection/handling, submission of specimen other than nasopharyngeal swab, presence of viral mutation(s) within the areas targeted by this assay, and inadequate number of viral copies (<131 copies/mL). A negative result must be combined with clinical observations, patient history, and epidemiological information. The expected result is Negative. Fact Sheet for Patients:  PinkCheek.be Fact Sheet for Healthcare Providers:  GravelBags.it This test is not yet ap proved or cleared by the [place] FDA and  has been authorized for detection and/or diagnosis of SARS-CoV-2 by FDA under an Emergency Use Authorization (EUA). This EUA will remain  in effect (meaning this test can be used) for the duration of the COVID-19 declaration under Section 564(b)(1) of the Act, 21 U.S.C. section 360bbb-3(b)(1), unless the authorization is terminated or revoked sooner.    Influenza A by PCR NEGATIVE NEGATIVE Final   Influenza B by PCR NEGATIVE NEGATIVE Final    Comment: (NOTE) The Xpert Xpress SARS-CoV-2/FLU/RSV assay is intended as an aid in  the diagnosis of influenza from Nasopharyngeal swab specimens and  should not be used as a sole basis for treatment. Nasal washings and  aspirates are unacceptable for Xpert Xpress SARS-CoV-2/FLU/RSV  testing. Fact Sheet for Patients: PinkCheek.be Fact Sheet for Healthcare Providers: GravelBags.it This test is not yet approved or cleared by the [place] FDA and  has been authorized for detection and/or diagnosis of SARS-CoV-2 by  FDA under an Emergency Use Authorization (EUA). This EUA will remain  in effect (meaning this test can be used) for the duration of the  Covid-19 declaration under Section 564(b)(1) of the Act, 21  U.S.C. section  360bbb-3(b)(1), unless the authorization is  terminated or revoked. Performed at [facility], [place] [address], [place], [place] [address]   Body fluid culture     Status: None   Collection Time: 06/11/19  2:03 PM   Specimen: Synovium; Synovial Fluid  Result Value Ref Range Status   Specimen Description SYNOVIAL FLUID KNEE LEFT  Final   Special Requests   Final    NONE Performed at [facility] [address] [place], [place] [address]    Gram Stain   Final    ABUNDANT WBC PRESENT,BOTH PMN AND MONONUCLEAR NO ORGANISMS SEEN Gram Stain Report Called to,Read Back By and Verified With: DR. Lyla Glassing AT 1512 ON 06/11/19 BY N.THOMPSON Performed at [facility], [place] [address] [place], [place] [address]    Culture   Final    NO GROWTH 3 DAYS Performed at [facility], [place] [address], [place], [place] [address]    Report Status 06/14/2019 FINAL  Final  Anaerobic culture     Status: None (Preliminary result)   Collection Time: 06/11/19  2:03 PM   Specimen: Synovium; Synovial Fluid  Result Value Ref Range Status   Specimen Description SYNOVIAL FLUID KNEE LEFT  Final   Special Requests   Final    NONE Performed at [facility] [address] [place], [place] [address]    Culture   Final    NO ANAEROBES ISOLATED; CULTURE IN PROGRESS FOR 5 DAYS   Report Status PENDING  Incomplete  Aerobic/Anaerobic Culture (surgical/deep wound)     Status: None (Preliminary result)   Collection Time: 06/11/19  6:30 PM  Specimen: Synovial, Left Knee; Body Fluid  Result Value Ref Range Status   Specimen Description SYNOVIAL KNEE LEFT  Final   Special Requests NONE  Final   Gram Stain   Final    RARE WBC PRESENT, PREDOMINANTLY PMN NO ORGANISMS SEEN    Culture   Final    RARE STAPHYLOCOCCUS AUREUS NO ANAEROBES ISOLATED; CULTURE IN PROGRESS FOR 5 DAYS    Report Status PENDING  Incomplete   Organism ID, Bacteria  STAPHYLOCOCCUS AUREUS  Final      Susceptibility   Staphylococcus aureus - MIC*    CIPROFLOXACIN >=8 RESISTANT Resistant     ERYTHROMYCIN >=8 RESISTANT Resistant     GENTAMICIN <=0.5 SENSITIVE Sensitive     OXACILLIN 0.5 SENSITIVE Sensitive     TETRACYCLINE <=1 SENSITIVE Sensitive     VANCOMYCIN <=0.5 SENSITIVE Sensitive     TRIMETH/SULFA <=10 SENSITIVE Sensitive     CLINDAMYCIN <=0.25 SENSITIVE Sensitive     RIFAMPIN <=0.5 SENSITIVE Sensitive     Inducible Clindamycin Value in next row Sensitive      NEGATIVEPerformed at [facility] [address], [place], [place] [address]    * RARE STAPHYLOCOCCUS AUREUS  Aerobic/Anaerobic Culture (surgical/deep wound)     Status: None (Preliminary result)   Collection Time: 06/11/19  6:32 PM   Specimen: Synovium; Tissue  Result Value Ref Range Status   Specimen Description   Final    SYNOVIAL TISSUE KNEE LEFT Performed at [facility] [address] [place], [place] [address]    Special Requests   Final    NONE Performed at [facility], [place] [address], [place], [place] [address]    Gram Stain   Final    NO WBC SEEN NO ORGANISMS SEEN Performed at [facility], [place] [address] [place], [place] [address]    Culture   Final    RARE STAPHYLOCOCCUS AUREUS NO ANAEROBES ISOLATED; CULTURE IN PROGRESS FOR 5 DAYS    Report Status PENDING  Incomplete   Organism ID, Bacteria STAPHYLOCOCCUS AUREUS  Final      Susceptibility   Staphylococcus aureus - MIC*    CIPROFLOXACIN >=8 RESISTANT Resistant     ERYTHROMYCIN >=8 RESISTANT Resistant     GENTAMICIN <=0.5 SENSITIVE Sensitive     OXACILLIN 0.5 SENSITIVE Sensitive     TETRACYCLINE <=1 SENSITIVE Sensitive     VANCOMYCIN 1 SENSITIVE Sensitive     TRIMETH/SULFA <=10 SENSITIVE Sensitive     CLINDAMYCIN <=0.25 SENSITIVE Sensitive     RIFAMPIN <=0.5 SENSITIVE Sensitive     Inducible Clindamycin NEGATIVE Sensitive     * RARE STAPHYLOCOCCUS AUREUS   Surgical PCR screen     Status: Abnormal   Collection Time: 06/11/19  8:49 PM   Specimen: Nasal Mucosa; Nasal Swab  Result Value Ref Range Status   MRSA, PCR NEGATIVE NEGATIVE Final   Staphylococcus aureus POSITIVE (A) NEGATIVE Final    Comment: (NOTE) The Xpert SA Assay (FDA approved for NASAL specimens in patients 42 years of age and older), is one component of a comprehensive surveillance program. It is not intended to diagnose infection nor to guide or monitor treatment. Performed at [facility], [place] [address] [place], [place] [address]   Culture, blood (routine x 2)     Status: Abnormal   Collection Time: 06/13/19 11:53 AM   Specimen: BLOOD LEFT HAND  Result Value Ref Range Status   Specimen Description   Final  BLOOD LEFT HAND Performed at [facility] [address], [place], [place] [address]    Special Requests   Final    BOTTLES DRAWN AEROBIC AND ANAEROBIC Blood Culture adequate volume Performed at [facility] [address], [place], [place] [address]    Culture  Setup Time   Final    GRAM POSITIVE COCCI IN CLUSTERS AEROBIC BOTTLE ONLY CRITICAL RESULT CALLED TO, READ BACK BY AND VERIFIED WITH: PHARMD C SHADE [id number] AT 23 AM BY CM    Culture (A)  Final    STAPHYLOCOCCUS AUREUS SUSCEPTIBILITIES PERFORMED ON PREVIOUS CULTURE WITHIN THE LAST 5 DAYS. Performed at [facility], [place] [address] [place], [place] [address]    Report Status 06/15/2019 FINAL  Final  Culture, blood (routine x 2)     Status: Abnormal   Collection Time: 06/13/19 11:53 AM   Specimen: BLOOD RIGHT HAND  Result Value Ref Range Status   Specimen Description   Final    BLOOD RIGHT HAND Performed at [facility] [address], [place], [place] [address]    Special Requests   Final    BOTTLES DRAWN AEROBIC AND ANAEROBIC Blood Culture results may not be optimal due to an inadequate volume of blood received  in culture bottles Performed at [facility] [address], [place], [place] [address]    Culture  Setup Time   Final    GRAM POSITIVE COCCI IN CLUSTERS AEROBIC BOTTLE ONLY CRITICAL RESULT CALLED TO, READ BACK BY AND VERIFIED WITH: PHARMD C SHADE [id number] AT 65 AM BY CM    Culture (A)  Final    STAPHYLOCOCCUS AUREUS SUSCEPTIBILITIES PERFORMED ON PREVIOUS CULTURE WITHIN THE LAST 5 DAYS. Performed at [facility], [place] [address] [place], [place] [address]    Report Status 06/15/2019 FINAL  Final         Radiology Studies: No results found.      Scheduled Meds: . nicotine  21 mg Transdermal Daily  . saccharomyces boulardii  250 mg Oral BID   Continuous Infusions: . sodium chloride 150 mL/hr at 06/16/19 0950  . nafcillin (NAFCIL) continuous infusion 20.8 mL/hr at 06/16/19 0109     LOS: 1 day    Time spent: 35 minutes    Belkys A Regalado, MD [facility]   If 7PM-7AM, please contact night-coverage [email or web address]  06/16/2019, 11:22 AM

## 2019-06-16 NOTE — Progress Notes (Signed)
Patient pain is unrelieved by PRN's ordered. She states that her Avis Epley who is at bedside brings her everyday to [facility] in [place] to get her medication- methadone daily. Called facility and they report she does not come to the clinic. Grandpa confirmed that he takes her everyday and he states he brought her back into hospital cause she was not suppose to leave.  Paged Dr Tyrell Antonio. Patient is anxious and upset why we can't find out why they are stating she isn't a patient there. Will continue to try and find out more information.  Requested MD to speak with patient

## 2019-06-16 NOTE — Discharge Summary (Addendum)
Physician Discharge Summary  Michelle Doyle [id number] DOB: 1990-12-02 DOA: 06/10/2019  PCP: Patient, No Pcp Per  Admit date: 06/10/2019 Discharge date: 06/16/2019  Admitted From: Home Disposition:  Signed AMA  Patient signed AMA.  For full details see the progress note written on 06/15/2019.  Discharge Diagnoses:  Active Problems:   Endocarditis   Sepsis [facility])    Discharge Instructions   Allergies as of 06/15/2019   No Known Allergies     Medication List    You have not been prescribed any medications.     No Known Allergies   Procedures/Studies: CT Chest W Contrast  Result Date: 06/11/2019 CLINICAL DATA:  Nausea and vomiting.  Sepsis.  IV drug abuse EXAM: CT CHEST, ABDOMEN, AND PELVIS WITH CONTRAST TECHNIQUE: Multidetector CT imaging of the chest, abdomen and pelvis was performed following the standard protocol during bolus administration of intravenous contrast. CONTRAST:  68mL OMNIPAQUE IOHEXOL 300 MG/ML  SOLN COMPARISON:  None. FINDINGS: CT CHEST FINDINGS Cardiovascular: Normal heart size. No pericardial effusion. No acute vascular finding. Mediastinum/Nodes: Mild generalized lymph node enlargement considered reactive. Lungs/Pleura: Numerous and generalized nodular ground-glass and consolidative opacities with variable cavitation. Largest cavities are measured at the right apex (2.4 cm) and lateral right base (2.6 cm). Borderline interlobular septal thickening/edema. No effusion or pneumothorax. Musculoskeletal: No acute finding CT ABDOMEN PELVIS FINDINGS Hepatobiliary: No focal liver abnormality.No evidence of biliary obstruction or stone. Pancreas: Unremarkable. Spleen: Generous size with 2 hypoenhancing wedges within the subcapsular upper and lower spleen. Adrenals/Urinary Tract: Negative adrenals. No hydronephrosis or stone. Small volume gas in the bladder, possibly from sampling. No bladder wall thickening. Stomach/Bowel: Liquid small and large bowel contents without bowel  wall thickening or appendicitis. No appendicitis. Vascular/Lymphatic: No acute vascular abnormality. No mass or adenopathy. Reproductive:No pathologic findings. Other: No ascites or pneumoperitoneum. Musculoskeletal: No acute abnormalities. IMPRESSION: 1. Pattern of septic pulmonary emboli. 2. Two splenic infarcts. Electronically Signed   By: Monte Fantasia M.D.   On: 06/11/2019 04:09   CT ABDOMEN PELVIS W CONTRAST  Result Date: 06/11/2019 CLINICAL DATA:  Nausea and vomiting.  Sepsis.  IV drug abuse EXAM: CT CHEST, ABDOMEN, AND PELVIS WITH CONTRAST TECHNIQUE: Multidetector CT imaging of the chest, abdomen and pelvis was performed following the standard protocol during bolus administration of intravenous contrast. CONTRAST:  14mL OMNIPAQUE IOHEXOL 300 MG/ML  SOLN COMPARISON:  None. FINDINGS: CT CHEST FINDINGS Cardiovascular: Normal heart size. No pericardial effusion. No acute vascular finding. Mediastinum/Nodes: Mild generalized lymph node enlargement considered reactive. Lungs/Pleura: Numerous and generalized nodular ground-glass and consolidative opacities with variable cavitation. Largest cavities are measured at the right apex (2.4 cm) and lateral right base (2.6 cm). Borderline interlobular septal thickening/edema. No effusion or pneumothorax. Musculoskeletal: No acute finding CT ABDOMEN PELVIS FINDINGS Hepatobiliary: No focal liver abnormality.No evidence of biliary obstruction or stone. Pancreas: Unremarkable. Spleen: Generous size with 2 hypoenhancing wedges within the subcapsular upper and lower spleen. Adrenals/Urinary Tract: Negative adrenals. No hydronephrosis or stone. Small volume gas in the bladder, possibly from sampling. No bladder wall thickening. Stomach/Bowel: Liquid small and large bowel contents without bowel wall thickening or appendicitis. No appendicitis. Vascular/Lymphatic: No acute vascular abnormality. No mass or adenopathy. Reproductive:No pathologic findings. Other: No ascites or  pneumoperitoneum. Musculoskeletal: No acute abnormalities. IMPRESSION: 1. Pattern of septic pulmonary emboli. 2. Two splenic infarcts. Electronically Signed   By: Monte Fantasia M.D.   On: 06/11/2019 04:09   CT L-SPINE NO CHARGE  Result Date: 06/11/2019 CLINICAL DATA:  Initial evaluation for diffuse  pain all over, opiate withdrawal. EXAM: CT LUMBAR SPINE WITHOUT CONTRAST TECHNIQUE: Multidetector CT imaging of the lumbar spine was performed without intravenous contrast administration. Multiplanar CT image reconstructions were also generated. COMPARISON:  None available. FINDINGS: Segmentation: Standard. Lowest well-formed disc space labeled the L5-S1 level. Alignment: Physiologic with preservation of the normal lumbar lordosis. No listhesis. Vertebrae: Vertebral body height maintained without evidence for acute or chronic fracture. No discrete or worrisome osseous lesions. No findings to suggest osteomyelitis discitis or septic arthritis. SI joints approximated symmetric. Paraspinal and other soft tissues: Paraspinous soft tissues demonstrate no acute finding. Multifocal nodular and parenchymal opacity seen within the partially visualized lungs, few of which demonstrate cavitary change, suggesting septic emboli. Remainder the visualized visceral structures otherwise unremarkable. Disc levels: No significant disc pathology seen within the lumbar spine. No appreciable epidural abscess or other collection. Small focal calcification noted at the distal aspect of the thecal sac, of doubtful significance. No significant canal or foraminal stenosis. IMPRESSION: 1. No acute abnormality within the lumbar spine. No findings to suggest osteomyelitis discitis or septic arthritis. 2. Multifocal nodular and parenchymal opacity within the partially visualized lungs, few of which demonstrate cavitary change, suggesting septic emboli. Electronically Signed   By: Jeannine Boga M.D.   On: 06/11/2019 03:59   DG Chest  Portable 1 View  Result Date: 06/11/2019 CLINICAL DATA:  Fever and IV drug use. EXAM: PORTABLE CHEST 1 VIEW COMPARISON:  None. FINDINGS: There is scattered bilateral pulmonary opacities. Some of these opacities appear to be cavitary. There is no pneumothorax. There may be a small right-sided pleural effusion. No acute osseous abnormality. Heart size is normal. IMPRESSION: Diffuse bilateral pulmonary opacities, some of which may be cavitary, suggestive of diffuse septic emboli or multifocal pneumonia. Electronically Signed   By: Constance Holster M.D.   On: 06/11/2019 00:46   DG Knee Left Port  Result Date: 06/11/2019 CLINICAL DATA:  29 year old female with left knee pain and swelling. EXAM: PORTABLE LEFT KNEE - 1-2 VIEW COMPARISON:  None. FINDINGS: No evidence of fracture, dislocation, or joint effusion. No evidence of arthropathy or other focal bone abnormality. Soft tissues are unremarkable. IMPRESSION: Negative. Electronically Signed   By: Anner Crete M.D.   On: 06/11/2019 15:00   DG Foot 2 Views Right  Result Date: 06/11/2019 CLINICAL DATA:  Right foot pain and swelling.  IV drug abuse EXAM: RIGHT FOOT - 2 VIEW COMPARISON:  None. FINDINGS: There is no evidence of fracture or dislocation. There is no evidence of arthropathy or other focal bone abnormality. Soft tissues are unremarkable. IMPRESSION: Negative. Electronically Signed   By: Davina Poke D.O.   On: 06/11/2019 15:01   ECHOCARDIOGRAM COMPLETE  Result Date: 06/11/2019    ECHOCARDIOGRAM REPORT   Patient Name:   Jeannetta Ellis Date of Exam: 06/11/2019 Medical Rec #:  [id number]    Height:       67.0 in Accession #:    [id number]   Weight:       110.0 lb Date of Birth:  04/28/1991    BSA:          1.569 m Patient Age:    28 years     BP:           108/55 mmHg Patient Gender: F            HR:           95 bpm. Exam Location:  Inpatient Procedure: 2D Echo, Color Doppler and Cardiac Doppler Indications:  Endocarditis i38  History:         Patient has no prior history of Echocardiogram examinations.                 IVDU.  Sonographer:    Raquel Sarna Senior RDCS Referring Phys: [id number] [place]  1. Left ventricular ejection fraction, by estimation, is 60 to 65%. The left ventricle has normal function. The left ventricle has no regional wall motion abnormalities. Left ventricular diastolic parameters were normal.  2. Right ventricular systolic function is normal. The right ventricular size is normal. There is normal pulmonary artery systolic pressure.  3. Thickened mitral valve. Can not exclude vegetation. . The mitral valve is abnormal. Mild to moderate mitral valve regurgitation. No evidence of mitral stenosis.  4. Thickened tricuspid valve, 1.1 x 1.7 cm vegetation. . The tricuspid valve is abnormal. Tricuspid valve regurgitation is mild to moderate.  5. The aortic valve is normal in structure. Aortic valve regurgitation is not visualized. No aortic stenosis is present.  6. The inferior vena cava is normal in size with greater than 50% respiratory variability, suggesting right atrial pressure of 3 mmHg. Conclusion(s)/Recommendation(s): Findings concerning for tricuspid valve and possible mitral valve vegetation, would recommend a Transesophageal Echocardiogram for clarification. FINDINGS  Left Ventricle: Left ventricular ejection fraction, by estimation, is 60 to 65%. The left ventricle has normal function. The left ventricle has no regional wall motion abnormalities. The left ventricular internal cavity size was normal in size. There is  no left ventricular hypertrophy. Left ventricular diastolic parameters were normal. Right Ventricle: The right ventricular size is normal. No increase in right ventricular wall thickness. Right ventricular systolic function is normal. There is normal pulmonary artery systolic pressure. The tricuspid regurgitant velocity is 2.52 m/s, and  with an assumed right atrial pressure of 3 mmHg, the estimated  right ventricular systolic pressure is [id number] mmHg. Left Atrium: Left atrial size was normal in size. Right Atrium: Right atrial size was normal in size. Pericardium: There is no evidence of pericardial effusion. Mitral Valve: Thickened mitral valve. Can not exclude vegetation. The mitral valve is abnormal. Normal mobility of the mitral valve leaflets. Mild to moderate mitral valve regurgitation. No evidence of mitral valve stenosis. Tricuspid Valve: Thickened tricuspid valve, 1.1 x 1.7 cm vegetation. The tricuspid valve is abnormal. Tricuspid valve regurgitation is mild to moderate. No evidence of tricuspid stenosis. Aortic Valve: The aortic valve is normal in structure. Aortic valve regurgitation is not visualized. No aortic stenosis is present. Pulmonic Valve: The pulmonic valve was normal in structure. Pulmonic valve regurgitation is not visualized. No evidence of pulmonic stenosis. Aorta: The aortic root is normal in size and structure. Venous: The inferior vena cava is normal in size with greater than 50% respiratory variability, suggesting right atrial pressure of 3 mmHg. IAS/Shunts: No atrial level shunt detected by color flow Doppler.  LEFT VENTRICLE PLAX 2D LVIDd:         4.30 cm LVIDs:         3.10 cm LV PW:         0.95 cm LV IVS:        1.04 cm LVOT diam:     2.00 cm LVOT Area:     3.14 cm  RIGHT VENTRICLE RV S prime:     16.40 cm/s TAPSE (M-mode): 2.7 cm LEFT ATRIUM             Index       RIGHT ATRIUM  Index LA diam:        3.40 cm 2.17 cm/m  RA Area:     18.10 cm LA Vol (A2C):   43.3 ml 27.61 ml/m RA Volume:   55.20 ml  35.19 ml/m LA Vol (A4C):   24.6 ml 15.68 ml/m LA Biplane Vol: 33.1 ml 21.10 ml/m   AORTA Ao Root diam: 2.60 cm Ao Asc diam:  2.90 cm TRICUSPID VALVE TR Peak grad:   25.4 mmHg TR Vmax:        252.00 cm/s  SHUNTS Systemic Diam: 2.00 cm Candee Furbish MD Electronically signed by Candee Furbish MD Signature Date/Time: 06/11/2019/3:28:02 PM    Final           Discharge  Exam: Vitals:   06/15/19 0920 06/15/19 0952  BP: 101/67 110/66  Pulse: (!) 107 (!) 103  Resp: 20 (!) 25  Temp: 99.7 F (37.6 C) 99.6 F (37.6 C)  SpO2: 100% 98%   Vitals:   06/15/19 0118 06/15/19 0516 06/15/19 0920 06/15/19 0952  BP: 103/68 106/66 101/67 110/66  Pulse: 88 (!) 112 (!) 107 (!) 103  Resp: 18 16 20  (!) 25  Temp: 98.4 F (36.9 C) 100.2 F (37.9 C) 99.7 F (37.6 C) 99.6 F (37.6 C)  TempSrc: Oral Oral Oral Oral  SpO2: 100% 99% 100% 98%  Weight:    49.9 kg  Height:    5\' 7"  (1.702 m)     The results of significant diagnostics from this hospitalization (including imaging, microbiology, ancillary and laboratory) are listed below for reference.     Microbiology: Recent Results (from the past 240 hour(s))  Culture, blood (Routine x 2)     Status: Abnormal   Collection Time: 06/11/19 12:05 AM   Specimen: BLOOD  Result Value Ref Range Status   Specimen Description   Final    BLOOD BLOOD LEFT HAND Performed at [facility] [address] [place], [place] [address]    Special Requests   Final    BOTTLES DRAWN AEROBIC ONLY Blood Culture adequate volume Performed at [facility] [address] [place], [place] [address]    Culture  Setup Time   Final    GRAM POSITIVE COCCI IN CLUSTERS AEROBIC BOTTLE ONLY CRITICAL VALUE NOTED.  VALUE IS CONSISTENT WITH PREVIOUSLY REPORTED AND CALLED VALUE.    Culture (A)  Final    STAPHYLOCOCCUS AUREUS SUSCEPTIBILITIES PERFORMED ON PREVIOUS CULTURE WITHIN THE LAST 5 DAYS. Performed at [facility], [place] [address], [place], [place] [address]    Report Status 06/13/2019 FINAL  Final  Urine culture     Status: Abnormal   Collection Time: 06/11/19 12:05 AM   Specimen: In/Out Cath Urine  Result Value Ref Range Status   Specimen Description   Final    IN/OUT CATH URINE Performed at [facility] [address], [place], [place] [address]    Special Requests    Final    NONE Performed at [facility] [address], [place] [address] [place], [place] [address]    Culture (A)  Final    >=100,000 COLONIES/mL KOCURIA SPECIES 10,000 COLONIES/mL STAPHYLOCOCCUS AUREUS Standardized susceptibility testing for this organism is not available. FOR KOCURIA SPECIES Performed at [facility], [place] [address], [place], [place] [address]    Report Status 06/13/2019 FINAL  Final   Organism ID, Bacteria STAPHYLOCOCCUS AUREUS (A)  Final      Susceptibility   Staphylococcus aureus - MIC*  CIPROFLOXACIN >=8 RESISTANT Resistant     GENTAMICIN <=0.5 SENSITIVE Sensitive     NITROFURANTOIN <=16 SENSITIVE Sensitive     OXACILLIN 0.5 SENSITIVE Sensitive     TETRACYCLINE <=1 SENSITIVE Sensitive     VANCOMYCIN 1 SENSITIVE Sensitive     TRIMETH/SULFA <=10 SENSITIVE Sensitive     CLINDAMYCIN <=0.25 SENSITIVE Sensitive     RIFAMPIN <=0.5 SENSITIVE Sensitive     Inducible Clindamycin NEGATIVE Sensitive     * 10,000 COLONIES/mL STAPHYLOCOCCUS AUREUS  Culture, blood (Routine x 2)     Status: Abnormal   Collection Time: 06/11/19 12:10 AM   Specimen: BLOOD  Result Value Ref Range Status   Specimen Description   Final    BLOOD BLOOD RIGHT FOREARM Performed at [facility] [address] [place], [place] [address]    Special Requests   Final    BOTTLES DRAWN AEROBIC ONLY Blood Culture adequate volume Performed at [facility] [address] [place], [place] [address]    Culture  Setup Time   Final    GRAM POSITIVE COCCI IN CLUSTERS AEROBIC BOTTLE ONLY CRITICAL RESULT CALLED TO, READ BACK BY AND VERIFIED WITH: [place] [id number] FCP Performed at [facility], [place] [address], [place], [place] [address]    Culture STAPHYLOCOCCUS AUREUS (A)  Final   Report Status 06/13/2019 FINAL  Final   Organism ID, Bacteria STAPHYLOCOCCUS AUREUS  Final      Susceptibility   Staphylococcus aureus - MIC*     CIPROFLOXACIN >=8 RESISTANT Resistant     ERYTHROMYCIN >=8 RESISTANT Resistant     GENTAMICIN <=0.5 SENSITIVE Sensitive     OXACILLIN 0.5 SENSITIVE Sensitive     TETRACYCLINE <=1 SENSITIVE Sensitive     VANCOMYCIN 1 SENSITIVE Sensitive     TRIMETH/SULFA <=10 SENSITIVE Sensitive     CLINDAMYCIN <=0.25 SENSITIVE Sensitive     RIFAMPIN <=0.5 SENSITIVE Sensitive     Inducible Clindamycin NEGATIVE Sensitive     * STAPHYLOCOCCUS AUREUS  Blood Culture ID Panel (Reflexed)     Status: Abnormal   Collection Time: 06/11/19 12:10 AM  Result Value Ref Range Status   Enterococcus species NOT DETECTED NOT DETECTED Final   Listeria monocytogenes NOT DETECTED NOT DETECTED Final   Staphylococcus species DETECTED (A) NOT DETECTED Final    Comment: CRITICAL RESULT CALLED TO, READ BACK BY AND VERIFIED WITH: PHARMD MICHELLE B. [id number] TO:[id number] FCP    Staphylococcus aureus (BCID) DETECTED (A) NOT DETECTED Final    Comment: Methicillin (oxacillin) susceptible Staphylococcus aureus (MSSA). Preferred therapy is anti staphylococcal beta lactam antibiotic (Cefazolin or Nafcillin), unless clinically contraindicated. CRITICAL RESULT CALLED TO, READ BACK BY AND VERIFIED WITH: PHARMD MICHELLE B. [id number] TO:[id number] FCP    Methicillin resistance NOT DETECTED NOT DETECTED Final   Streptococcus species NOT DETECTED NOT DETECTED Final   Streptococcus agalactiae NOT DETECTED NOT DETECTED Final   Streptococcus pneumoniae NOT DETECTED NOT DETECTED Final   Streptococcus pyogenes NOT DETECTED NOT DETECTED Final   Acinetobacter baumannii NOT DETECTED NOT DETECTED Final   Enterobacteriaceae species NOT DETECTED NOT DETECTED Final   Enterobacter cloacae complex NOT DETECTED NOT DETECTED Final   Escherichia coli NOT DETECTED NOT DETECTED Final   Klebsiella oxytoca NOT DETECTED NOT DETECTED Final   Klebsiella pneumoniae NOT DETECTED NOT DETECTED Final   Proteus species NOT DETECTED NOT DETECTED Final   Serratia marcescens NOT DETECTED  NOT DETECTED Final   Haemophilus influenzae NOT DETECTED NOT DETECTED Final  Neisseria meningitidis NOT DETECTED NOT DETECTED Final   Pseudomonas aeruginosa NOT DETECTED NOT DETECTED Final   Candida albicans NOT DETECTED NOT DETECTED Final   Candida glabrata NOT DETECTED NOT DETECTED Final   Candida krusei NOT DETECTED NOT DETECTED Final   Candida parapsilosis NOT DETECTED NOT DETECTED Final   Candida tropicalis NOT DETECTED NOT DETECTED Final    Comment: Performed at [facility], [place] [address], [place], [place] [address]  SARS CORONAVIRUS 2 (TAT 6-24 HRS) Nasopharyngeal Nasopharyngeal Swab     Status: None   Collection Time: 06/11/19 12:52 AM   Specimen: Nasopharyngeal Swab  Result Value Ref Range Status   SARS Coronavirus 2 NEGATIVE NEGATIVE Final    Comment: (NOTE) SARS-CoV-2 target nucleic acids are NOT DETECTED. The SARS-CoV-2 RNA is generally detectable in upper and lower respiratory specimens during the acute phase of infection. Negative results do not preclude SARS-CoV-2 infection, do not rule out co-infections with other pathogens, and should not be used as the sole basis for treatment or other patient management decisions. Negative results must be combined with clinical observations, patient history, and epidemiological information. The expected result is Negative. Fact Sheet for Patients: SugarRoll.be Fact Sheet for Healthcare Providers: https://www.woods-mathews.com/ This test is not yet approved or cleared by the [place] FDA and  has been authorized for detection and/or diagnosis of SARS-CoV-2 by FDA under an Emergency Use Authorization (EUA). This EUA will remain  in effect (meaning this test can be used) for the duration of the COVID-19 declaration under Section 56 4(b)(1) of the Act, 21 U.S.C. section 360bbb-3(b)(1), unless the authorization is terminated or revoked sooner. Performed at [facility],  [place] [address], [place], [place] [address]   Respiratory Panel by RT PCR (Flu A&B, Covid) - Nasopharyngeal Swab     Status: None   Collection Time: 06/11/19  5:25 AM   Specimen: Nasopharyngeal Swab  Result Value Ref Range Status   SARS Coronavirus 2 by RT PCR NEGATIVE NEGATIVE Final    Comment: (NOTE) SARS-CoV-2 target nucleic acids are NOT DETECTED. The SARS-CoV-2 RNA is generally detectable in upper respiratoy specimens during the acute phase of infection. The lowest concentration of SARS-CoV-2 viral copies this assay can detect is 131 copies/mL. A negative result does not preclude SARS-Cov-2 infection and should not be used as the sole basis for treatment or other patient management decisions. A negative result may occur with  improper specimen collection/handling, submission of specimen other than nasopharyngeal swab, presence of viral mutation(s) within the areas targeted by this assay, and inadequate number of viral copies (<131 copies/mL). A negative result must be combined with clinical observations, patient history, and epidemiological information. The expected result is Negative. Fact Sheet for Patients:  PinkCheek.be Fact Sheet for Healthcare Providers:  GravelBags.it This test is not yet ap proved or cleared by the [place] FDA and  has been authorized for detection and/or diagnosis of SARS-CoV-2 by FDA under an Emergency Use Authorization (EUA). This EUA will remain  in effect (meaning this test can be used) for the duration of the COVID-19 declaration under Section 564(b)(1) of the Act, 21 U.S.C. section 360bbb-3(b)(1), unless the authorization is terminated or revoked sooner.    Influenza A by PCR NEGATIVE NEGATIVE Final   Influenza B by PCR NEGATIVE NEGATIVE Final    Comment: (NOTE) The Xpert Xpress SARS-CoV-2/FLU/RSV assay is intended as an aid in  the diagnosis of influenza from Nasopharyngeal swab  specimens and  should not be used  as a sole basis for treatment. Nasal washings and  aspirates are unacceptable for Xpert Xpress SARS-CoV-2/FLU/RSV  testing. Fact Sheet for Patients: PinkCheek.be Fact Sheet for Healthcare Providers: GravelBags.it This test is not yet approved or cleared by the [place] FDA and  has been authorized for detection and/or diagnosis of SARS-CoV-2 by  FDA under an Emergency Use Authorization (EUA). This EUA will remain  in effect (meaning this test can be used) for the duration of the  Covid-19 declaration under Section 564(b)(1) of the Act, 21  U.S.C. section 360bbb-3(b)(1), unless the authorization is  terminated or revoked. Performed at [facility], [place] [address] [place], [place] [address]   Body fluid culture     Status: None   Collection Time: 06/11/19  2:03 PM   Specimen: Synovium; Synovial Fluid  Result Value Ref Range Status   Specimen Description SYNOVIAL FLUID KNEE LEFT  Final   Special Requests   Final    NONE Performed at [facility] [address] [place], [place] [address]    Gram Stain   Final    ABUNDANT WBC PRESENT,BOTH PMN AND MONONUCLEAR NO ORGANISMS SEEN Gram Stain Report Called to,Read Back By and Verified With: DR. Lyla Glassing AT 1512 ON 06/11/19 BY N.THOMPSON Performed at [facility], [place] [address] [place], [place] [address]    Culture   Final    NO GROWTH 3 DAYS Performed at [facility], [place] [address], [place], [place] [address]    Report Status 06/14/2019 FINAL  Final  Anaerobic culture     Status: None   Collection Time: 06/11/19  2:03 PM   Specimen: Synovium; Synovial Fluid  Result Value Ref Range Status   Specimen Description SYNOVIAL FLUID KNEE LEFT  Final   Special Requests   Final    NONE Performed at [facility] [address] [place], [place] [address]     Culture   Final    NO ANAEROBES ISOLATED Performed at [facility], [place] [address] [place], [place] [address]    Report Status 06/16/2019 FINAL  Final  Aerobic/Anaerobic Culture (surgical/deep wound)     Status: None   Collection Time: 06/11/19  6:30 PM   Specimen: Synovial, Left Knee; Body Fluid  Result Value Ref Range Status   Specimen Description   Final    SYNOVIAL KNEE LEFT Performed at [facility] [address], [place], [place] [address]    Special Requests   Final    NONE Performed at [facility], [place] [address], [place], [place] [address]    Gram Stain   Final    RARE WBC PRESENT, PREDOMINANTLY PMN NO ORGANISMS SEEN    Culture   Final    RARE STAPHYLOCOCCUS AUREUS NO ANAEROBES ISOLATED Performed at [facility], [place] [address] [place], [place] [address]    Report Status 06/16/2019 FINAL  Final   Organism ID, Bacteria STAPHYLOCOCCUS AUREUS  Final      Susceptibility   Staphylococcus aureus - MIC*    CIPROFLOXACIN >=8 RESISTANT Resistant     ERYTHROMYCIN >=8 RESISTANT Resistant     GENTAMICIN <=0.5 SENSITIVE Sensitive     OXACILLIN 0.5 SENSITIVE Sensitive     TETRACYCLINE <=1 SENSITIVE Sensitive     VANCOMYCIN <=0.5 SENSITIVE Sensitive     TRIMETH/SULFA <=10 SENSITIVE Sensitive     CLINDAMYCIN <=0.25 SENSITIVE Sensitive     RIFAMPIN <=0.5 SENSITIVE Sensitive  Inducible Clindamycin NEGATIVE Sensitive     * RARE STAPHYLOCOCCUS AUREUS  Aerobic/Anaerobic Culture (surgical/deep wound)     Status: None (Preliminary result)   Collection Time: 06/11/19  6:32 PM   Specimen: Synovium; Tissue  Result Value Ref Range Status   Specimen Description   Final    SYNOVIAL TISSUE KNEE LEFT Performed at [facility] [address], [place], [place] [address]    Special Requests   Final    NONE Performed at [facility], [place] [address], [place], [place] [address]    Gram Stain    Final    NO WBC SEEN NO ORGANISMS SEEN Performed at [facility], [place] [address], [place], [place] [address]    Culture RARE STAPHYLOCOCCUS AUREUS  Final   Report Status PENDING  Incomplete   Organism ID, Bacteria STAPHYLOCOCCUS AUREUS  Final      Susceptibility   Staphylococcus aureus - MIC*    CIPROFLOXACIN >=8 RESISTANT Resistant     ERYTHROMYCIN >=8 RESISTANT Resistant     GENTAMICIN <=0.5 SENSITIVE Sensitive     OXACILLIN 0.5 SENSITIVE Sensitive     TETRACYCLINE <=1 SENSITIVE Sensitive     VANCOMYCIN 1 SENSITIVE Sensitive     TRIMETH/SULFA <=10 SENSITIVE Sensitive     CLINDAMYCIN <=0.25 SENSITIVE Sensitive     RIFAMPIN <=0.5 SENSITIVE Sensitive     Inducible Clindamycin NEGATIVE Sensitive     * RARE STAPHYLOCOCCUS AUREUS  Surgical PCR screen     Status: Abnormal   Collection Time: 06/11/19  8:49 PM   Specimen: Nasal Mucosa; Nasal Swab  Result Value Ref Range Status   MRSA, PCR NEGATIVE NEGATIVE Final   Staphylococcus aureus POSITIVE (A) NEGATIVE Final    Comment: (NOTE) The Xpert SA Assay (FDA approved for NASAL specimens in patients 80 years of age and older), is one component of a comprehensive surveillance program. It is not intended to diagnose infection nor to guide or monitor treatment. Performed at [facility], [place] [address] [place], [place] [address]   Culture, blood (routine x 2)     Status: Abnormal   Collection Time: 06/13/19 11:53 AM   Specimen: BLOOD LEFT HAND  Result Value Ref Range Status   Specimen Description   Final    BLOOD LEFT HAND Performed at [place] [address] [place], [place] [address]    Special Requests   Final    BOTTLES DRAWN AEROBIC AND ANAEROBIC Blood Culture adequate volume Performed at [facility] [address], [place], [place] [address]    Culture  Setup Time   Final    GRAM POSITIVE COCCI IN CLUSTERS AEROBIC BOTTLE ONLY CRITICAL RESULT CALLED TO,  READ BACK BY AND VERIFIED WITH: PHARMD C SHADE [id number] AT 46 AM BY CM    Culture (A)  Final    STAPHYLOCOCCUS AUREUS SUSCEPTIBILITIES PERFORMED ON PREVIOUS CULTURE WITHIN THE LAST 5 DAYS. Performed at [facility], [place] [address] [place], [place] [address]    Report Status 06/15/2019 FINAL  Final  Culture, blood (routine x 2)     Status: Abnormal   Collection Time: 06/13/19 11:53 AM   Specimen: BLOOD RIGHT HAND  Result Value Ref Range Status   Specimen Description   Final    BLOOD RIGHT HAND Performed at [place] [address] [place], [place] [address]    Special Requests   Final    BOTTLES DRAWN AEROBIC  AND ANAEROBIC Blood Culture results may not be optimal due to an inadequate volume of blood received in culture bottles Performed at [facility], [place] [address], [place], [place] [address]    Culture  Setup Time   Final    GRAM POSITIVE COCCI IN CLUSTERS AEROBIC BOTTLE ONLY CRITICAL RESULT CALLED TO, READ BACK BY AND VERIFIED WITH: PHARMD C SHADE [id number] AT 57 AM BY CM    Culture (A)  Final    STAPHYLOCOCCUS AUREUS SUSCEPTIBILITIES PERFORMED ON PREVIOUS CULTURE WITHIN THE LAST 5 DAYS. Performed at [facility], [place] [address], [place], [place] [address]    Report Status 06/15/2019 FINAL  Final     Labs: BNP (last 3 results) No results for input(s): BNP in the last 8760 hours. Basic Metabolic Panel: Recent Labs  Lab 06/13/19 1153 06/14/19 0838 06/15/19 0756 06/15/19 1421 06/15/19 1520 06/16/19 0500  NA 141 137 138 135  --  140  K 2.6* 2.6* 3.7 3.0*  --  3.9  CL 108 105 106 102  --  108  CO2 24 23 23 23   --  22  GLUCOSE 88 163* 87 153*  --  102*  BUN 17 9 6 7   --  <5*  CREATININE 0.66 0.52 0.48 0.53  --  0.51  CALCIUM 7.6* 7.1* 7.0* 7.1*  --  7.5*  MG 2.5*  --   --   --  2.0  --    Liver Function Tests: Recent Labs  Lab 06/11/19 0005 06/16/19 0500  AST 162* 20  ALT 47* 11  ALKPHOS 139* 72  BILITOT  1.3* 0.9  PROT 7.6 6.7  ALBUMIN 2.0* 1.8*   No results for input(s): LIPASE, AMYLASE in the last 168 hours. No results for input(s): AMMONIA in the last 168 hours. CBC: Recent Labs  Lab 06/11/19 0005 06/12/19 0605 06/13/19 1153 06/14/19 0838 06/15/19 0756 06/15/19 1421 06/16/19 0500  WBC 24.6*   < > 17.3* 17.9* 18.8* 19.5* 19.5*  NEUTROABS 21.1*  --  14.4* 14.2* 14.4* 16.3*  --   HGB 9.4*   < > 9.0* 7.7* 6.5* 6.8* 11.7*  HCT 27.7*   < > 27.1* 24.8* 18.6* 20.9* 35.7*  MCV 78.2*   < > 78.3* 85.5 75.9* 80.4 84.8  PLT 135*   < > 183 188 216 232 268   < > = values in this interval not displayed.   Cardiac Enzymes: Recent Labs  Lab 06/11/19 0232  CKTOTAL 146   BNP: Invalid input(s): POCBNP CBG: No results for input(s): GLUCAP in the last 168 hours. D-Dimer No results for input(s): DDIMER in the last 72 hours. Hgb A1c No results for input(s): HGBA1C in the last 72 hours. Lipid Profile No results for input(s): CHOL, HDL, LDLCALC, TRIG, CHOLHDL, LDLDIRECT in the last 72 hours. Thyroid function studies No results for input(s): TSH, T4TOTAL, T3FREE, THYROIDAB in the last 72 hours.  Invalid input(s): FREET3 Anemia work up Recent Labs    06/15/19 1716  VITAMINB12 1,260*  FOLATE 18.2  FERRITIN 217  TIBC 163*  IRON 17*  RETICCTPCT 2.5   Urinalysis    Component Value Date/Time   COLORURINE AMBER (A) 06/11/2019 0005   APPEARANCEUR CLOUDY (A) 06/11/2019 0005   LABSPEC 1.008 06/11/2019 0005   PHURINE 5.0 06/11/2019 0005   GLUCOSEU NEGATIVE 06/11/2019 0005   HGBUR MODERATE (A) 06/11/2019 0005   BILIRUBINUR NEGATIVE 06/11/2019 0005   KETONESUR NEGATIVE 06/11/2019 0005   PROTEINUR 30 (A) 06/11/2019 0005  NITRITE NEGATIVE 06/11/2019 0005   LEUKOCYTESUR LARGE (A) 06/11/2019 0005   Sepsis Labs Invalid input(s): PROCALCITONIN,  WBC,  LACTICIDVEN Microbiology Recent Results (from the past 240 hour(s))  Culture, blood (Routine x 2)     Status: Abnormal   Collection  Time: 06/11/19 12:05 AM   Specimen: BLOOD  Result Value Ref Range Status   Specimen Description   Final    BLOOD BLOOD LEFT HAND Performed at [facility], [place] [address] [place], [place] [address]    Special Requests   Final    BOTTLES DRAWN AEROBIC ONLY Blood Culture adequate volume Performed at [place] [address], [place], [place] [address]    Culture  Setup Time   Final    GRAM POSITIVE COCCI IN CLUSTERS AEROBIC BOTTLE ONLY CRITICAL VALUE NOTED.  VALUE IS CONSISTENT WITH PREVIOUSLY REPORTED AND CALLED VALUE.    Culture (A)  Final    STAPHYLOCOCCUS AUREUS SUSCEPTIBILITIES PERFORMED ON PREVIOUS CULTURE WITHIN THE LAST 5 DAYS. Performed at [facility], [place] [address], [place], [place] [address]    Report Status 06/13/2019 FINAL  Final  Urine culture     Status: Abnormal   Collection Time: 06/11/19 12:05 AM   Specimen: In/Out Cath Urine  Result Value Ref Range Status   Specimen Description   Final    IN/OUT CATH URINE Performed at [place] [address], [place], [place] [address]    Special Requests   Final    NONE Performed at [facility], [place] [address] [place], [place] [address]    Culture (A)  Final    >=100,000 COLONIES/mL KOCURIA SPECIES 10,000 COLONIES/mL STAPHYLOCOCCUS AUREUS Standardized susceptibility testing for this organism is not available. FOR KOCURIA SPECIES Performed at [facility], [place] [address], [place], [place] [address]    Report Status 06/13/2019 FINAL  Final   Organism ID, Bacteria STAPHYLOCOCCUS AUREUS (A)  Final      Susceptibility   Staphylococcus aureus - MIC*    CIPROFLOXACIN >=8 RESISTANT Resistant     GENTAMICIN <=0.5 SENSITIVE Sensitive     NITROFURANTOIN <=16 SENSITIVE Sensitive     OXACILLIN 0.5 SENSITIVE Sensitive     TETRACYCLINE <=1 SENSITIVE Sensitive     VANCOMYCIN 1 SENSITIVE Sensitive     TRIMETH/SULFA <=10 SENSITIVE  Sensitive     CLINDAMYCIN <=0.25 SENSITIVE Sensitive     RIFAMPIN <=0.5 SENSITIVE Sensitive     Inducible Clindamycin NEGATIVE Sensitive     * 10,000 COLONIES/mL STAPHYLOCOCCUS AUREUS  Culture, blood (Routine x 2)     Status: Abnormal   Collection Time: 06/11/19 12:10 AM   Specimen: BLOOD  Result Value Ref Range Status   Specimen Description   Final    BLOOD BLOOD RIGHT FOREARM Performed at [place] [address] [place], [place] [address]    Special Requests   Final    BOTTLES DRAWN AEROBIC ONLY Blood Culture adequate volume Performed at [place] [address] [place], [place] [address]    Culture  Setup Time   Final    GRAM POSITIVE COCCI IN CLUSTERS AEROBIC BOTTLE ONLY CRITICAL RESULT CALLED TO, READ BACK BY AND VERIFIED WITH: Osino [id number] FCP Performed at [facility], [place] [address], [place], [place] [address]    Culture STAPHYLOCOCCUS AUREUS (A)  Final   Report Status 06/13/2019 FINAL  Final   Organism ID, Bacteria STAPHYLOCOCCUS  AUREUS  Final      Susceptibility   Staphylococcus aureus - MIC*    CIPROFLOXACIN >=8 RESISTANT Resistant     ERYTHROMYCIN >=8 RESISTANT Resistant     GENTAMICIN <=0.5 SENSITIVE Sensitive     OXACILLIN 0.5 SENSITIVE Sensitive     TETRACYCLINE <=1 SENSITIVE Sensitive     VANCOMYCIN 1 SENSITIVE Sensitive     TRIMETH/SULFA <=10 SENSITIVE Sensitive     CLINDAMYCIN <=0.25 SENSITIVE Sensitive     RIFAMPIN <=0.5 SENSITIVE Sensitive     Inducible Clindamycin NEGATIVE Sensitive     * STAPHYLOCOCCUS AUREUS  Blood Culture ID Panel (Reflexed)     Status: Abnormal   Collection Time: 06/11/19 12:10 AM  Result Value Ref Range Status   Enterococcus species NOT DETECTED NOT DETECTED Final   Listeria monocytogenes NOT DETECTED NOT DETECTED Final   Staphylococcus species DETECTED (A) NOT DETECTED Final    Comment: CRITICAL RESULT CALLED TO, READ BACK BY AND VERIFIED WITH: PHARMD  MICHELLE B. [id number] FCP    Staphylococcus aureus (BCID) DETECTED (A) NOT DETECTED Final    Comment: Methicillin (oxacillin) susceptible Staphylococcus aureus (MSSA). Preferred therapy is anti staphylococcal beta lactam antibiotic (Cefazolin or Nafcillin), unless clinically contraindicated. CRITICAL RESULT CALLED TO, READ BACK BY AND VERIFIED WITH: PHARMD MICHELLE B. [id number] FCP    Methicillin resistance NOT DETECTED NOT DETECTED Final   Streptococcus species NOT DETECTED NOT DETECTED Final   Streptococcus agalactiae NOT DETECTED NOT DETECTED Final   Streptococcus pneumoniae NOT DETECTED NOT DETECTED Final   Streptococcus pyogenes NOT DETECTED NOT DETECTED Final   Acinetobacter baumannii NOT DETECTED NOT DETECTED Final   Enterobacteriaceae species NOT DETECTED NOT DETECTED Final   Enterobacter cloacae complex NOT DETECTED NOT DETECTED Final   Escherichia coli NOT DETECTED NOT DETECTED Final   Klebsiella oxytoca NOT DETECTED NOT DETECTED Final   Klebsiella pneumoniae NOT DETECTED NOT DETECTED Final   Proteus species NOT DETECTED NOT DETECTED Final   Serratia marcescens NOT DETECTED NOT DETECTED Final   Haemophilus influenzae NOT DETECTED NOT DETECTED Final   Neisseria meningitidis NOT DETECTED NOT DETECTED Final   Pseudomonas aeruginosa NOT DETECTED NOT DETECTED Final   Candida albicans NOT DETECTED NOT DETECTED Final   Candida glabrata NOT DETECTED NOT DETECTED Final   Candida krusei NOT DETECTED NOT DETECTED Final   Candida parapsilosis NOT DETECTED NOT DETECTED Final   Candida tropicalis NOT DETECTED NOT DETECTED Final    Comment: Performed at [facility], [place] [address], [place], [place] [address]  SARS CORONAVIRUS 2 (TAT 6-24 HRS) Nasopharyngeal Nasopharyngeal Swab     Status: None   Collection Time: 06/11/19 12:52 AM   Specimen: Nasopharyngeal Swab  Result Value Ref Range Status   SARS Coronavirus 2 NEGATIVE NEGATIVE Final    Comment: (NOTE) SARS-CoV-2 target  nucleic acids are NOT DETECTED. The SARS-CoV-2 RNA is generally detectable in upper and lower respiratory specimens during the acute phase of infection. Negative results do not preclude SARS-CoV-2 infection, do not rule out co-infections with other pathogens, and should not be used as the sole basis for treatment or other patient management decisions. Negative results must be combined with clinical observations, patient history, and epidemiological information. The expected result is Negative. Fact Sheet for Patients: SugarRoll.be Fact Sheet for Healthcare Providers: https://www.woods-mathews.com/ This test is not yet approved or cleared by the [place] FDA and  has been authorized for detection and/or diagnosis of SARS-CoV-2 by FDA under an Emergency Use Authorization (EUA). This EUA  will remain  in effect (meaning this test can be used) for the duration of the COVID-19 declaration under Section 56 4(b)(1) of the Act, 21 U.S.C. section 360bbb-3(b)(1), unless the authorization is terminated or revoked sooner. Performed at [facility], [place] [address] [place], [place] [address]   Respiratory Panel by RT PCR (Flu A&B, Covid) - Nasopharyngeal Swab     Status: None   Collection Time: 06/11/19  5:25 AM   Specimen: Nasopharyngeal Swab  Result Value Ref Range Status   SARS Coronavirus 2 by RT PCR NEGATIVE NEGATIVE Final    Comment: (NOTE) SARS-CoV-2 target nucleic acids are NOT DETECTED. The SARS-CoV-2 RNA is generally detectable in upper respiratoy specimens during the acute phase of infection. The lowest concentration of SARS-CoV-2 viral copies this assay can detect is 131 copies/mL. A negative result does not preclude SARS-Cov-2 infection and should not be used as the sole basis for treatment or other patient management decisions. A negative result may occur with  improper specimen collection/handling, submission of specimen  other than nasopharyngeal swab, presence of viral mutation(s) within the areas targeted by this assay, and inadequate number of viral copies (<131 copies/mL). A negative result must be combined with clinical observations, patient history, and epidemiological information. The expected result is Negative. Fact Sheet for Patients:  PinkCheek.be Fact Sheet for Healthcare Providers:  GravelBags.it This test is not yet ap proved or cleared by the [place] FDA and  has been authorized for detection and/or diagnosis of SARS-CoV-2 by FDA under an Emergency Use Authorization (EUA). This EUA will remain  in effect (meaning this test can be used) for the duration of the COVID-19 declaration under Section 564(b)(1) of the Act, 21 U.S.C. section 360bbb-3(b)(1), unless the authorization is terminated or revoked sooner.    Influenza A by PCR NEGATIVE NEGATIVE Final   Influenza B by PCR NEGATIVE NEGATIVE Final    Comment: (NOTE) The Xpert Xpress SARS-CoV-2/FLU/RSV assay is intended as an aid in  the diagnosis of influenza from Nasopharyngeal swab specimens and  should not be used as a sole basis for treatment. Nasal washings and  aspirates are unacceptable for Xpert Xpress SARS-CoV-2/FLU/RSV  testing. Fact Sheet for Patients: PinkCheek.be Fact Sheet for Healthcare Providers: GravelBags.it This test is not yet approved or cleared by the [place] FDA and  has been authorized for detection and/or diagnosis of SARS-CoV-2 by  FDA under an Emergency Use Authorization (EUA). This EUA will remain  in effect (meaning this test can be used) for the duration of the  Covid-19 declaration under Section 564(b)(1) of the Act, 21  U.S.C. section 360bbb-3(b)(1), unless the authorization is  terminated or revoked. Performed at [facility], [place] [address] [place], [place] [address]   Body fluid culture     Status: None   Collection Time: 06/11/19  2:03 PM   Specimen: Synovium; Synovial Fluid  Result Value Ref Range Status   Specimen Description SYNOVIAL FLUID KNEE LEFT  Final   Special Requests   Final    NONE Performed at [place] [address], [place], [place] [address]    Gram Stain   Final    ABUNDANT WBC PRESENT,BOTH PMN AND MONONUCLEAR NO ORGANISMS SEEN Gram Stain Report Called to,Read Back By and Verified With: DR. Lyla Glassing AT 1512 ON 06/11/19 BY N.THOMPSON Performed at [facility], [place] [address], [place], [place] [address]    Culture   Final    NO GROWTH  3 DAYS Performed at [facility], [place] [address] [place], [place] [address]    Report Status 06/14/2019 FINAL  Final  Anaerobic culture     Status: None   Collection Time: 06/11/19  2:03 PM   Specimen: Synovium; Synovial Fluid  Result Value Ref Range Status   Specimen Description SYNOVIAL FLUID KNEE LEFT  Final   Special Requests   Final    NONE Performed at [place] [address], [place], [place] [address]    Culture   Final    NO ANAEROBES ISOLATED Performed at [facility], [place] [address] [place], [place] [address]    Report Status 06/16/2019 FINAL  Final  Aerobic/Anaerobic Culture (surgical/deep wound)     Status: None   Collection Time: 06/11/19  6:30 PM   Specimen: Synovial, Left Knee; Body Fluid  Result Value Ref Range Status   Specimen Description   Final    SYNOVIAL KNEE LEFT Performed at [place] [address], [place], [place] [address]    Special Requests   Final    NONE Performed at [facility], [place] [address], [place], [place] [address]    Gram Stain   Final    RARE WBC PRESENT, PREDOMINANTLY PMN NO ORGANISMS SEEN    Culture   Final    RARE STAPHYLOCOCCUS AUREUS NO ANAEROBES ISOLATED Performed at [facility], [place] [address] [place], [place] [address]    Report Status 06/16/2019 FINAL  Final   Organism ID, Bacteria STAPHYLOCOCCUS AUREUS  Final      Susceptibility   Staphylococcus aureus - MIC*    CIPROFLOXACIN >=8 RESISTANT Resistant     ERYTHROMYCIN >=8 RESISTANT Resistant     GENTAMICIN <=0.5 SENSITIVE Sensitive     OXACILLIN 0.5 SENSITIVE Sensitive     TETRACYCLINE <=1 SENSITIVE Sensitive     VANCOMYCIN <=0.5 SENSITIVE Sensitive     TRIMETH/SULFA <=10 SENSITIVE Sensitive     CLINDAMYCIN <=0.25 SENSITIVE Sensitive     RIFAMPIN <=0.5 SENSITIVE Sensitive     Inducible Clindamycin NEGATIVE Sensitive     * RARE STAPHYLOCOCCUS AUREUS  Aerobic/Anaerobic Culture (surgical/deep wound)     Status: None (Preliminary result)   Collection Time: 06/11/19  6:32 PM   Specimen: Synovium; Tissue  Result Value Ref Range Status   Specimen Description   Final    SYNOVIAL TISSUE KNEE LEFT Performed at [place] [address] [place], [place] [address]    Special Requests   Final    NONE Performed at [facility], [place] [address] [place], [place] [address]    Gram Stain   Final    NO WBC SEEN NO ORGANISMS SEEN Performed at [facility], [place] [address], [place],  [address]    Culture RARE STAPHYLOCOCCUS AUREUS  Final   Report Status PENDING  Incomplete   Organism ID, Bacteria STAPHYLOCOCCUS AUREUS  Final      Susceptibility   Staphylococcus aureus - MIC*    CIPROFLOXACIN >=8 RESISTANT Resistant     ERYTHROMYCIN >=8 RESISTANT Resistant     GENTAMICIN <=0.5 SENSITIVE Sensitive     OXACILLIN 0.5 SENSITIVE Sensitive     TETRACYCLINE <=1 SENSITIVE Sensitive     VANCOMYCIN 1 SENSITIVE Sensitive     TRIMETH/SULFA <=10 SENSITIVE Sensitive     CLINDAMYCIN <=0.25 SENSITIVE Sensitive     RIFAMPIN <=0.5 SENSITIVE Sensitive     Inducible Clindamycin  NEGATIVE Sensitive     * RARE STAPHYLOCOCCUS AUREUS  Surgical PCR screen     Status:  Abnormal   Collection Time: 06/11/19  8:49 PM   Specimen: Nasal Mucosa; Nasal Swab  Result Value Ref Range Status   MRSA, PCR NEGATIVE NEGATIVE Final   Staphylococcus aureus POSITIVE (A) NEGATIVE Final    Comment: (NOTE) The Xpert SA Assay (FDA approved for NASAL specimens in patients 61 years of age and older), is one component of a comprehensive surveillance program. It is not intended to diagnose infection nor to guide or monitor treatment. Performed at [facility], [place] [address] [place], [place] [address]   Culture, blood (routine x 2)     Status: Abnormal   Collection Time: 06/13/19 11:53 AM   Specimen: BLOOD LEFT HAND  Result Value Ref Range Status   Specimen Description   Final    BLOOD LEFT HAND Performed at [place] [address], [place], [place] [address]    Special Requests   Final    BOTTLES DRAWN AEROBIC AND ANAEROBIC Blood Culture adequate volume Performed at [place] [address], [place], [place] [address]    Culture  Setup Time   Final    GRAM POSITIVE COCCI IN CLUSTERS AEROBIC BOTTLE ONLY CRITICAL RESULT CALLED TO, READ BACK BY AND VERIFIED WITH: PHARMD C SHADE [id number] AT 18 AM BY CM    Culture (A)  Final    STAPHYLOCOCCUS AUREUS SUSCEPTIBILITIES PERFORMED ON PREVIOUS CULTURE WITHIN THE LAST 5 DAYS. Performed at [facility], [place] [address] [place], [place] [address]    Report Status 06/15/2019 FINAL  Final  Culture, blood (routine x 2)     Status: Abnormal   Collection Time: 06/13/19 11:53 AM   Specimen: BLOOD RIGHT HAND  Result Value Ref Range Status   Specimen Description   Final    BLOOD RIGHT HAND Performed at [facility] [address], [place], [place] [address]    Special Requests   Final    BOTTLES DRAWN AEROBIC AND ANAEROBIC Blood Culture results may not be optimal due to an inadequate volume of blood received in culture bottles Performed at  [place] [address] [place], [place] [address]    Culture  Setup Time   Final    GRAM POSITIVE COCCI IN CLUSTERS AEROBIC BOTTLE ONLY CRITICAL RESULT CALLED TO, READ BACK BY AND VERIFIED WITH: PHARMD C SHADE [id number] AT 64 AM BY CM    Culture (A)  Final    STAPHYLOCOCCUS AUREUS SUSCEPTIBILITIES PERFORMED ON PREVIOUS CULTURE WITHIN THE LAST 5 DAYS. Performed at [facility], [place] [address] [place], [place] [address]    Report Status 06/15/2019 FINAL  Final    Please note: You were cared for by a hospitalist during your hospital stay. Once you are discharged, your primary care physician will handle any further medical issues. Please note that NO REFILLS for any discharge medications will be authorized once you are discharged, as it is imperative that you return to your primary care physician (or establish a relationship with a primary care physician if you do not have one) for your post hospital discharge needs so that they can reassess your need for medications and monitor your lab values.    Time coordinating discharge: 40 minutes  SIGNED:   Shelly Coss, MD  [facility] 06/16/2019, 2:06 PM Pager [phone]  If 7PM-7AM, please contact night-coverage [email or web address]  Password [email or web address]

## 2019-06-17 DIAGNOSIS — R768 Other specified abnormal immunological findings in serum: Secondary | ICD-10-CM

## 2019-06-17 DIAGNOSIS — Z720 Tobacco use: Secondary | ICD-10-CM

## 2019-06-17 DIAGNOSIS — D735 Infarction of spleen: Secondary | ICD-10-CM

## 2019-06-17 DIAGNOSIS — E876 Hypokalemia: Secondary | ICD-10-CM

## 2019-06-17 DIAGNOSIS — B9561 Methicillin susceptible Staphylococcus aureus infection as the cause of diseases classified elsewhere: Secondary | ICD-10-CM

## 2019-06-17 DIAGNOSIS — M00062 Staphylococcal arthritis, left knee: Secondary | ICD-10-CM

## 2019-06-17 DIAGNOSIS — R7881 Bacteremia: Secondary | ICD-10-CM

## 2019-06-17 DIAGNOSIS — F1910 Other psychoactive substance abuse, uncomplicated: Secondary | ICD-10-CM

## 2019-06-17 LAB — TYPE AND SCREEN
ABO/RH(D): O POS
Antibody Screen: NEGATIVE
Unit division: 0
Unit division: 0
Unit division: 0

## 2019-06-17 LAB — COMPREHENSIVE METABOLIC PANEL
ALT: 12 U/L (ref 0–44)
AST: 18 U/L (ref 15–41)
Albumin: 1.6 g/dL — ABNORMAL LOW (ref 3.5–5.0)
Alkaline Phosphatase: 61 U/L (ref 38–126)
Anion gap: 8 (ref 5–15)
BUN: <5 mg/dL — ABNORMAL LOW (ref 6–20)
CO2: 23 mmol/L (ref 22–32)
Calcium: 7.3 mg/dL — ABNORMAL LOW (ref 8.9–10.3)
Chloride: 108 mmol/L (ref 98–111)
Creatinine, Ser: 0.45 mg/dL (ref 0.44–1.00)
GFR calc Af Amer: >60 mL/min (ref >60)
GFR calc non Af Amer: >60 mL/min (ref >60)
Glucose, Bld: 91 mg/dL (ref 70–99)
Potassium: 3 mmol/L — ABNORMAL LOW (ref 3.5–5.1)
Sodium: 139 mmol/L (ref 135–145)
Total Bilirubin: 0.8 mg/dL (ref 0.3–1.2)
Total Protein: 6.4 g/dL — ABNORMAL LOW (ref 6.5–8.1)

## 2019-06-17 LAB — BPAM RBC
Blood Product Expiration Date: 202105092359
Blood Product Expiration Date: 202105102359
Blood Product Expiration Date: 202105102359
ISSUE DATE / TIME: 202104122046
ISSUE DATE / TIME: 202104130125
ISSUE DATE / TIME: 202104130412
Unit Type and Rh: 5100
Unit Type and Rh: 5100
Unit Type and Rh: 5100

## 2019-06-17 LAB — CBC WITH DIFFERENTIAL/PLATELET
Abs Immature Granulocytes: 0.26 10*3/uL — ABNORMAL HIGH (ref 0.00–0.07)
Basophils Absolute: 0.1 10*3/uL (ref 0.0–0.1)
Basophils Relative: 0 %
Eosinophils Absolute: 0.1 10*3/uL (ref 0.0–0.5)
Eosinophils Relative: 0 %
HCT: 30.8 % — ABNORMAL LOW (ref 36.0–46.0)
Hemoglobin: 10.5 g/dL — ABNORMAL LOW (ref 12.0–15.0)
Immature Granulocytes: 1 %
Lymphocytes Relative: 15 %
Lymphs Abs: 3 10*3/uL (ref 0.7–4.0)
MCH: 27.5 pg (ref 26.0–34.0)
MCHC: 34.1 g/dL (ref 30.0–36.0)
MCV: 80.6 fL (ref 80.0–100.0)
Monocytes Absolute: 1.1 10*3/uL — ABNORMAL HIGH (ref 0.1–1.0)
Monocytes Relative: 6 %
Neutro Abs: 15.5 10*3/uL — ABNORMAL HIGH (ref 1.7–7.7)
Neutrophils Relative %: 78 %
Platelets: 293 10*3/uL (ref 150–400)
RBC: 3.82 MIL/uL — ABNORMAL LOW (ref 3.87–5.11)
RDW: 15.7 % — ABNORMAL HIGH (ref 11.5–15.5)
WBC: 20 10*3/uL — ABNORMAL HIGH (ref 4.0–10.5)
nRBC: 0.1 % (ref 0.0–0.2)

## 2019-06-17 LAB — AEROBIC/ANAEROBIC CULTURE W GRAM STAIN (SURGICAL/DEEP WOUND): Gram Stain: NONE SEEN

## 2019-06-17 LAB — MAGNESIUM: Magnesium: 1.7 mg/dL (ref 1.7–2.4)

## 2019-06-17 MED ORDER — POTASSIUM CHLORIDE CRYS ER 20 MEQ PO TBCR
40.0000 meq | EXTENDED_RELEASE_TABLET | ORAL | Status: AC
  Administered 2019-06-17 – 2019-06-18 (×2): 40 meq via ORAL
  Filled 2019-06-17 (×2): qty 2

## 2019-06-17 MED ORDER — QUETIAPINE FUMARATE 50 MG PO TABS
50.0000 mg | ORAL_TABLET | Freq: Every day | ORAL | Status: DC
  Administered 2019-06-17: 50 mg via ORAL
  Filled 2019-06-17: qty 1

## 2019-06-17 MED ORDER — LOPERAMIDE HCL 2 MG PO CAPS
2.0000 mg | ORAL_CAPSULE | ORAL | Status: DC | PRN
  Administered 2019-06-17: 4 mg via ORAL
  Administered 2019-06-18: 2 mg via ORAL
  Filled 2019-06-17: qty 2
  Filled 2019-06-17: qty 1

## 2019-06-17 MED ORDER — DICYCLOMINE HCL 20 MG PO TABS
20.0000 mg | ORAL_TABLET | Freq: Four times a day (QID) | ORAL | Status: DC | PRN
  Administered 2019-06-20 – 2019-06-22 (×2): 20 mg via ORAL
  Filled 2019-06-17 (×4): qty 1

## 2019-06-17 MED ORDER — ONDANSETRON 4 MG PO TBDP: 4.0000 mg | ORAL_TABLET | Freq: Four times a day (QID) | ORAL | Status: DC | PRN

## 2019-06-17 MED ORDER — MAGNESIUM SULFATE 4 GM/100ML IV SOLN
4.0000 g | Freq: Once | INTRAVENOUS | Status: AC
  Administered 2019-06-17: 4 g via INTRAVENOUS
  Filled 2019-06-17: qty 100

## 2019-06-17 MED ORDER — HYDROXYZINE HCL 25 MG PO TABS
25.0000 mg | ORAL_TABLET | Freq: Four times a day (QID) | ORAL | Status: DC | PRN
  Administered 2019-06-17 – 2019-06-22 (×6): 25 mg via ORAL
  Filled 2019-06-17 (×6): qty 1

## 2019-06-17 MED ORDER — LIP MEDEX EX OINT
TOPICAL_OINTMENT | CUTANEOUS | Status: DC | PRN
  Filled 2019-06-17: qty 7

## 2019-06-17 MED ORDER — NAPROXEN 250 MG PO TABS
500.0000 mg | ORAL_TABLET | Freq: Two times a day (BID) | ORAL | Status: DC | PRN
  Filled 2019-06-17: qty 2

## 2019-06-17 MED ORDER — METHOCARBAMOL 500 MG PO TABS
500.0000 mg | ORAL_TABLET | Freq: Three times a day (TID) | ORAL | Status: DC | PRN
  Administered 2019-06-18 – 2019-06-22 (×8): 500 mg via ORAL
  Filled 2019-06-17 (×8): qty 1

## 2019-06-17 MED ORDER — HYDROXYZINE HCL 25 MG PO TABS: 25.0000 mg | ORAL_TABLET | Freq: Three times a day (TID) | ORAL | Status: DC | PRN

## 2019-06-17 MED ORDER — ACETAMINOPHEN 325 MG PO TABS
650.0000 mg | ORAL_TABLET | Freq: Four times a day (QID) | ORAL | Status: DC | PRN
  Administered 2019-06-18 – 2019-06-29 (×10): 650 mg via ORAL
  Filled 2019-06-17 (×12): qty 2

## 2019-06-17 NOTE — Progress Notes (Signed)
Patient is requesting Seroquel 150 mg nightly for sleep, states she has a prescription, and was previously receiving this during her last admit. Patient is requesting Vistaril nightly for anxiety, patient is unsure of prescription dose. RN unable to locate any previous prescriptions or administrations in chart, will request medications from on call M. Sharlet Salina FNP.

## 2019-06-17 NOTE — Plan of Care (Signed)
  Problem: Education: Goal: Knowledge of General Education information will improve Description Including pain rating scale, medication(s)/side effects and non-pharmacologic comfort measures Outcome: Progressing   

## 2019-06-17 NOTE — Plan of Care (Signed)
  Problem: Education: Goal: Knowledge of General Education information will improve Description: Including pain rating scale, medication(s)/side effects and non-pharmacologic comfort measures 06/17/2019 1836 by Zadie Rhine, RN Outcome: Progressing 06/17/2019 1511 by Zadie Rhine, RN Outcome: Progressing

## 2019-06-17 NOTE — Progress Notes (Signed)
PROGRESS NOTE    Michelle Doyle  [id number] DOB: 04/28/1991 DOA: 06/15/2019 PCP: Patient, No Pcp Per    Chief Complaint  Patient presents with  . Abnormal Lab    Brief Narrative: (Start on day 1 of progress note - keep it brief and live) 29 year old with PMH significant for IV heroin abuse, Untreated Hepatitis C, initially admitted for acute heroin withdrawal and sepsis. Patient was transfer to [facility] 4/12 for TEE, but it was cancel due to severe anemia, Hb at 6. Patient Decided left AMA from [facility] Her Grandfather brought her back to [place].   Patient was found to have disseminated MSSA Bacteremia, mitral valve and tricuspid valve endocarditis, pulmonary septic emboli, splenic emboli, probable septic arthritis of the left knee.  ID following.    Assessment & Plan:   Active Problems:   Endocarditis   Hypokalemia   Septic arthritis ([place])   Splenic infarct   Bacteremia due to methicillin susceptible Staphylococcus aureus (MSSA)   IV drug abuse ([place])   Tobacco abuse   Positive hepatitis C antibody test  #1 disseminated MSSA bacteremia Patient noted to have disseminated MSSA bacteremia with pulmonary septic emboli, splenic emboli, probable septic arthritis of the left knee, tricuspid and mitral valve endocarditis.  MRI of the brain was obtained incomplete study patient refused to complete the study however no significant abnormality detected.  2D echo done with thickening tricuspid valve 1.1 x 1.7 cm vegetation, tricuspid valve mild to moderate regurgitation, thickened mitral valve cannot rule out vegetation, abnormal mitral valve.  Blood cultures from 06/11/2019 were growing staph aureus.  Repeat blood cultures 06/13/2019 with cocci in clusters.  Repeat cultures obtained 13 2021 pending with no growth to date.  Continue IV nafcillin will likely need 6 weeks of IV antibiotics.  ID following and appreciate input and recommendations.  2.  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  Patient with MSSA bacteremia.  Continue IV nafcillin.  Per ID.  3.  Septic emboli/splenic infarct IV nafcillin.  4.  Left knee septic arthritis Status post I&D of the left knee.  Continue IV nafcillin.  Pain management.  Supportive care.  5.  Opiate abuse Patient uses IV heroin.  Patient states was at the methadone clinic and was getting methadone 35 mg daily and requesting her current dose be increased.  Patient currently on methadone 10 mg twice a day.  Pharmacy to verify with methadone clinic on patient's current dose and last time she was at the methadone clinic.  Continue current regimen of methadone 10 mg twice daily as well as Percocet for pain.  6.  Untreated/positive hepatitis C HCVRNA pending.  ID following.  7.  hypokalemia Potassium at 3.0 this morning.  K. Dur 40 mEq p.o. every 4 hours x2 doses.  Keep magnesium greater than 2.  8.  Tobacco abuse Tobacco cessation.  Nicotine patch.  9.  Thrombocytopenia Resolved.  10.  Iron deficiency anemia Status post 2 units packed red blood cells on 06/15/2019.  Hemoglobin currently at 10.5.  Will likely need oral iron supplementation on discharge.  May consider IV iron during this hospitalization however will discuss with ID.    DVT prophylaxis: Lovenox Code Status: Full Family Communication: Updated patient and grandfather at bedside. Disposition:   Status is: Inpatient    Dispo: The patient is from: Home  Anticipated d/c is to: Likely home              Anticipated d/c date is: In approximately 5 weeks.              Patient currently on IV nafcillin and will need to complete 6 weeks of therapy prior to discharge as patient with IV drug abuse.        Consultants:   Infectious disease: Dr. Graylon Good  Procedures:   MRI brain from  06/16/2019    Antimicrobials:   IV nafcillin 06/15/2019 (D7/42) >>>>>   Subjective: Patient laying in bed.  Patient states she is not on her home regimen of methadone and was on 35 mg daily which was slowly being uptitrated.  Patient denies any significant chest pain.  Denies any significant shortness of breath.  Objective: Vitals:   06/16/19 2052 06/17/19 0300 06/17/19 0527 06/17/19 1338  BP: 108/73  121/80 130/88  Pulse: 89  87 92  Resp: 18 15 18 19   Temp: 99 F (37.2 C)  99.6 F (37.6 C) 98.8 F (37.1 C)  TempSrc: Oral  Oral Oral  SpO2: 100%  99% 100%  Weight:      Height:        Intake/Output Summary (Last 24 hours) at 06/17/2019 2044 Last data filed at 06/17/2019 1700 Gross per 24 hour  Intake 4017.62 ml  Output 1050 ml  Net 2967.62 ml   Filed Weights   06/16/19 0110 06/16/19 0816  Weight: 49.9 kg 65.2 kg    Examination:  General exam: Appears calm and comfortable  Respiratory system: Clear to auscultation. Respiratory effort normal. Cardiovascular system: S1 & S2 heard, RRR. No JVD, murmurs, rubs, gallops or clicks. No pedal edema. Gastrointestinal system: Abdomen is nondistended, soft and nontender. No organomegaly or masses felt. Normal bowel sounds heard. Central nervous system: Alert and oriented. No focal neurological deficits. Extremities: Left knee bandaged.  Skin: No rashes, lesions or ulcers Psychiatry: Judgement and insight appear normal. Mood & affect appropriate.     Data Reviewed: I have personally reviewed following labs and imaging studies  CBC: Recent Labs  Lab 06/13/19 1153 06/13/19 1153 06/14/19 0838 06/15/19 0756 06/15/19 1421 06/16/19 0500 06/17/19 1211  WBC 17.3*   < > 17.9* 18.8* 19.5* 19.5* 20.0*  NEUTROABS 14.4*  --  14.2* 14.4* 16.3*  --  15.5*  HGB 9.0*   < > 7.7* 6.5* 6.8* 11.7* 10.5*  HCT 27.1*   < > 24.8* 18.6* 20.9* 35.7* 30.8*  MCV 78.3*   < > 85.5 75.9* 80.4 84.8 80.6  PLT 183   < > 188 216 232 268 293   < > =  values in this interval not displayed.    Basic Metabolic Panel: Recent Labs  Lab 06/13/19 1153 06/13/19 1153 06/14/19 0838 06/15/19 0756 06/15/19 1421 06/15/19 1520 06/16/19 0500 06/17/19 1211  NA 141   < > 137 138 135  --  140 139  K 2.6*   < > 2.6* 3.7 3.0*  --  3.9 3.0*  CL 108   < > 105 106 102  --  108 108  CO2 24   < > 23 23 23   --  22 23  GLUCOSE 88   < > 163* 87 153*  --  102* 91  BUN 17   < > 9 6 7   --  <5* <5*  CREATININE 0.66   < > 0.52 0.48 0.53  --  0.51 0.45  CALCIUM 7.6*   < >  7.1* 7.0* 7.1*  --  7.5* 7.3*  MG 2.5*  --   --   --   --  2.0  --  1.7   < > = values in this interval not displayed.    GFR: Estimated Creatinine Clearance: 103.6 mL/min (by C-G formula based on SCr of 0.45 mg/dL).  Liver Function Tests: Recent Labs  Lab 06/11/19 0005 06/16/19 0500 06/17/19 1211  AST 162* 20 18  ALT 47* 11 12  ALKPHOS 139* 72 61  BILITOT 1.3* 0.9 0.8  PROT 7.6 6.7 6.4*  ALBUMIN 2.0* 1.8* 1.6*    CBG: No results for input(s): GLUCAP in the last 168 hours.   Recent Results (from the past 240 hour(s))  Culture, blood (Routine x 2)     Status: Abnormal   Collection Time: 06/11/19 12:05 AM   Specimen: BLOOD  Result Value Ref Range Status   Specimen Description   Final    BLOOD BLOOD LEFT HAND Performed at [place] [address] [place], [place] [address]    Special Requests   Final    BOTTLES DRAWN AEROBIC ONLY Blood Culture adequate volume Performed at [place] [address], [place], [place] [address]    Culture  Setup Time   Final    GRAM POSITIVE COCCI IN CLUSTERS AEROBIC BOTTLE ONLY CRITICAL VALUE NOTED.  VALUE IS CONSISTENT WITH PREVIOUSLY REPORTED AND CALLED VALUE.    Culture (A)  Final    STAPHYLOCOCCUS AUREUS SUSCEPTIBILITIES PERFORMED ON PREVIOUS CULTURE WITHIN THE LAST 5 DAYS. Performed at [facility], [place] [address], [place], [place] [address]    Report Status 06/13/2019 FINAL   Final  Urine culture     Status: Abnormal   Collection Time: 06/11/19 12:05 AM   Specimen: In/Out Cath Urine  Result Value Ref Range Status   Specimen Description   Final    IN/OUT CATH URINE Performed at [place] [address], [place], [place] [address]    Special Requests   Final    NONE Performed at [facility], [place] [address] [place], [place] [address]    Culture (A)  Final    >=100,000 COLONIES/mL KOCURIA SPECIES 10,000 COLONIES/mL STAPHYLOCOCCUS AUREUS Standardized susceptibility testing for this organism is not available. FOR KOCURIA SPECIES Performed at [facility], [place] [address] [place], [place] [address]    Report Status 06/13/2019 FINAL  Final   Organism ID, Bacteria STAPHYLOCOCCUS AUREUS (A)  Final      Susceptibility   Staphylococcus aureus - MIC*    CIPROFLOXACIN >=8 RESISTANT Resistant     GENTAMICIN <=0.5 SENSITIVE Sensitive     NITROFURANTOIN <=16 SENSITIVE Sensitive     OXACILLIN 0.5 SENSITIVE Sensitive     TETRACYCLINE <=1 SENSITIVE Sensitive     VANCOMYCIN 1 SENSITIVE Sensitive     TRIMETH/SULFA <=10 SENSITIVE Sensitive     CLINDAMYCIN <=0.25 SENSITIVE Sensitive     RIFAMPIN <=0.5 SENSITIVE Sensitive     Inducible Clindamycin NEGATIVE Sensitive     * 10,000 COLONIES/mL STAPHYLOCOCCUS AUREUS  Culture, blood (Routine x 2)     Status: Abnormal   Collection Time: 06/11/19 12:10 AM   Specimen: BLOOD  Result Value Ref Range Status   Specimen Description   Final    BLOOD BLOOD RIGHT FOREARM Performed at [place] [address], [place], [place] [address]    Special Requests   Final    BOTTLES  DRAWN AEROBIC ONLY Blood Culture adequate volume Performed at [place] [address], [place], [place] [address]    Culture  Setup Time   Final    GRAM POSITIVE COCCI IN CLUSTERS AEROBIC BOTTLE ONLY CRITICAL RESULT CALLED TO, READ BACK BY AND VERIFIED WITH:  [place] [id number] FCP Performed at [facility], [place] [address] [place], [place] [address]    Culture STAPHYLOCOCCUS AUREUS (A)  Final   Report Status 06/13/2019 FINAL  Final   Organism ID, Bacteria STAPHYLOCOCCUS AUREUS  Final      Susceptibility   Staphylococcus aureus - MIC*    CIPROFLOXACIN >=8 RESISTANT Resistant     ERYTHROMYCIN >=8 RESISTANT Resistant     GENTAMICIN <=0.5 SENSITIVE Sensitive     OXACILLIN 0.5 SENSITIVE Sensitive     TETRACYCLINE <=1 SENSITIVE Sensitive     VANCOMYCIN 1 SENSITIVE Sensitive     TRIMETH/SULFA <=10 SENSITIVE Sensitive     CLINDAMYCIN <=0.25 SENSITIVE Sensitive     RIFAMPIN <=0.5 SENSITIVE Sensitive     Inducible Clindamycin NEGATIVE Sensitive     * STAPHYLOCOCCUS AUREUS  Blood Culture ID Panel (Reflexed)     Status: Abnormal   Collection Time: 06/11/19 12:10 AM  Result Value Ref Range Status   Enterococcus species NOT DETECTED NOT DETECTED Final   Listeria monocytogenes NOT DETECTED NOT DETECTED Final   Staphylococcus species DETECTED (A) NOT DETECTED Final    Comment: CRITICAL RESULT CALLED TO, READ BACK BY AND VERIFIED WITH: PHARMD MICHELLE B. [id number] FCP    Staphylococcus aureus (BCID) DETECTED (A) NOT DETECTED Final    Comment: Methicillin (oxacillin) susceptible Staphylococcus aureus (MSSA). Preferred therapy is anti staphylococcal beta lactam antibiotic (Cefazolin or Nafcillin), unless clinically contraindicated. CRITICAL RESULT CALLED TO, READ BACK BY AND VERIFIED WITH: PHARMD MICHELLE B. [id number] FCP    Methicillin resistance NOT DETECTED NOT DETECTED Final   Streptococcus species NOT DETECTED NOT DETECTED Final   Streptococcus agalactiae NOT DETECTED NOT DETECTED Final   Streptococcus pneumoniae NOT DETECTED NOT DETECTED Final   Streptococcus pyogenes NOT DETECTED NOT DETECTED Final   Acinetobacter baumannii NOT DETECTED NOT DETECTED Final   Enterobacteriaceae species NOT DETECTED NOT DETECTED Final    Enterobacter cloacae complex NOT DETECTED NOT DETECTED Final   Escherichia coli NOT DETECTED NOT DETECTED Final   Klebsiella oxytoca NOT DETECTED NOT DETECTED Final   Klebsiella pneumoniae NOT DETECTED NOT DETECTED Final   Proteus species NOT DETECTED NOT DETECTED Final   Serratia marcescens NOT DETECTED NOT DETECTED Final   Haemophilus influenzae NOT DETECTED NOT DETECTED Final   Neisseria meningitidis NOT DETECTED NOT DETECTED Final   Pseudomonas aeruginosa NOT DETECTED NOT DETECTED Final   Candida albicans NOT DETECTED NOT DETECTED Final   Candida glabrata NOT DETECTED NOT DETECTED Final   Candida krusei NOT DETECTED NOT DETECTED Final   Candida parapsilosis NOT DETECTED NOT DETECTED Final   Candida tropicalis NOT DETECTED NOT DETECTED Final    Comment: Performed at [facility], [place] [address], [place], [place] [address]  SARS CORONAVIRUS 2 (TAT 6-24 HRS) Nasopharyngeal Nasopharyngeal Swab     Status: None   Collection Time: 06/11/19 12:52 AM   Specimen: Nasopharyngeal Swab  Result Value Ref Range Status   SARS Coronavirus 2 NEGATIVE NEGATIVE Final    Comment: (NOTE) SARS-CoV-2 target nucleic acids are NOT DETECTED. The SARS-CoV-2 RNA is generally detectable in upper and lower respiratory specimens during the acute phase of infection. Negative  results do not preclude SARS-CoV-2 infection, do not rule out co-infections with other pathogens, and should not be used as the sole basis for treatment or other patient management decisions. Negative results must be combined with clinical observations, patient history, and epidemiological information. The expected result is Negative. Fact Sheet for Patients: SugarRoll.be Fact Sheet for Healthcare Providers: https://www.woods-mathews.com/ This test is not yet approved or cleared by the [place] FDA and  has been authorized for detection and/or diagnosis of SARS-CoV-2 by FDA under an  Emergency Use Authorization (EUA). This EUA will remain  in effect (meaning this test can be used) for the duration of the COVID-19 declaration under Section 56 4(b)(1) of the Act, 21 U.S.C. section 360bbb-3(b)(1), unless the authorization is terminated or revoked sooner. Performed at [facility], [place] [address], [place], [place] [address]   Respiratory Panel by RT PCR (Flu A&B, Covid) - Nasopharyngeal Swab     Status: None   Collection Time: 06/11/19  5:25 AM   Specimen: Nasopharyngeal Swab  Result Value Ref Range Status   SARS Coronavirus 2 by RT PCR NEGATIVE NEGATIVE Final    Comment: (NOTE) SARS-CoV-2 target nucleic acids are NOT DETECTED. The SARS-CoV-2 RNA is generally detectable in upper respiratoy specimens during the acute phase of infection. The lowest concentration of SARS-CoV-2 viral copies this assay can detect is 131 copies/mL. A negative result does not preclude SARS-Cov-2 infection and should not be used as the sole basis for treatment or other patient management decisions. A negative result may occur with  improper specimen collection/handling, submission of specimen other than nasopharyngeal swab, presence of viral mutation(s) within the areas targeted by this assay, and inadequate number of viral copies (<131 copies/mL). A negative result must be combined with clinical observations, patient history, and epidemiological information. The expected result is Negative. Fact Sheet for Patients:  PinkCheek.be Fact Sheet for Healthcare Providers:  GravelBags.it This test is not yet ap proved or cleared by the [place] FDA and  has been authorized for detection and/or diagnosis of SARS-CoV-2 by FDA under an Emergency Use Authorization (EUA). This EUA will remain  in effect (meaning this test can be used) for the duration of the COVID-19 declaration under Section 564(b)(1) of the Act, 21 U.S.C. section  360bbb-3(b)(1), unless the authorization is terminated or revoked sooner.    Influenza A by PCR NEGATIVE NEGATIVE Final   Influenza B by PCR NEGATIVE NEGATIVE Final    Comment: (NOTE) The Xpert Xpress SARS-CoV-2/FLU/RSV assay is intended as an aid in  the diagnosis of influenza from Nasopharyngeal swab specimens and  should not be used as a sole basis for treatment. Nasal washings and  aspirates are unacceptable for Xpert Xpress SARS-CoV-2/FLU/RSV  testing. Fact Sheet for Patients: PinkCheek.be Fact Sheet for Healthcare Providers: GravelBags.it This test is not yet approved or cleared by the [place] FDA and  has been authorized for detection and/or diagnosis of SARS-CoV-2 by  FDA under an Emergency Use Authorization (EUA). This EUA will remain  in effect (meaning this test can be used) for the duration of the  Covid-19 declaration under Section 564(b)(1) of the Act, 21  U.S.C. section 360bbb-3(b)(1), unless the authorization is  terminated or revoked. Performed at [facility], [place] [address] [place], [place] [address]   Body fluid culture     Status: None   Collection Time: 06/11/19  2:03 PM   Specimen: Synovium; Synovial Fluid  Result Value Ref Range Status   Specimen Description  SYNOVIAL FLUID KNEE LEFT  Final   Special Requests   Final    NONE Performed at [place] [address], [place], [place] [address]    Gram Stain   Final    ABUNDANT WBC PRESENT,BOTH PMN AND MONONUCLEAR NO ORGANISMS SEEN Gram Stain Report Called to,Read Back By and Verified With: DR. Lyla Glassing AT 1512 ON 06/11/19 BY N.Ernesto Lashway Performed at [facility], [place] [address], [place], [place] [address]    Culture   Final    NO GROWTH 3 DAYS Performed at [facility], [place] [address], [place], [place] [address]    Report Status 06/14/2019 FINAL  Final  Anaerobic culture      Status: None   Collection Time: 06/11/19  2:03 PM   Specimen: Synovium; Synovial Fluid  Result Value Ref Range Status   Specimen Description SYNOVIAL FLUID KNEE LEFT  Final   Special Requests   Final    NONE Performed at [place] [address] [place], [place] [address]    Culture   Final    NO ANAEROBES ISOLATED Performed at [facility], [place] [address] [place], [place] [address]    Report Status 06/16/2019 FINAL  Final  Aerobic/Anaerobic Culture (surgical/deep wound)     Status: None   Collection Time: 06/11/19  6:30 PM   Specimen: Synovial, Left Knee; Body Fluid  Result Value Ref Range Status   Specimen Description   Final    SYNOVIAL KNEE LEFT Performed at [place] [address] [place], [place] [address]    Special Requests   Final    NONE Performed at [facility], [place] [address] [place], [place] [address]    Gram Stain   Final    RARE WBC PRESENT, PREDOMINANTLY PMN NO ORGANISMS SEEN    Culture   Final    RARE STAPHYLOCOCCUS AUREUS NO ANAEROBES ISOLATED Performed at [facility], [place] [address] [place], [place] [address]    Report Status 06/16/2019 FINAL  Final   Organism ID, Bacteria STAPHYLOCOCCUS AUREUS  Final      Susceptibility   Staphylococcus aureus - MIC*    CIPROFLOXACIN >=8 RESISTANT Resistant     ERYTHROMYCIN >=8 RESISTANT Resistant     GENTAMICIN <=0.5 SENSITIVE Sensitive     OXACILLIN 0.5 SENSITIVE Sensitive     TETRACYCLINE <=1 SENSITIVE Sensitive     VANCOMYCIN <=0.5 SENSITIVE Sensitive     TRIMETH/SULFA <=10 SENSITIVE Sensitive     CLINDAMYCIN <=0.25 SENSITIVE Sensitive     RIFAMPIN <=0.5 SENSITIVE Sensitive     Inducible Clindamycin NEGATIVE Sensitive     * RARE STAPHYLOCOCCUS AUREUS  Aerobic/Anaerobic Culture (surgical/deep wound)     Status: None   Collection Time: 06/11/19  6:32 PM   Specimen: Synovium; Tissue  Result Value Ref Range Status   Specimen  Description   Final    SYNOVIAL TISSUE KNEE LEFT Performed at [place] [address], [place], [place] [address]    Special Requests   Final    NONE Performed at [facility], [place] [address], [place], [place] [address]    Gram Stain NO WBC SEEN NO ORGANISMS SEEN   Final   Culture   Final    RARE STAPHYLOCOCCUS AUREUS NO ANAEROBES ISOLATED Performed at [facility], [place] [address], [place],  [address]    Report Status 06/17/2019 FINAL  Final  Organism ID, Bacteria STAPHYLOCOCCUS AUREUS  Final      Susceptibility   Staphylococcus aureus - MIC*    CIPROFLOXACIN >=8 RESISTANT Resistant     ERYTHROMYCIN >=8 RESISTANT Resistant     GENTAMICIN <=0.5 SENSITIVE Sensitive     OXACILLIN 0.5 SENSITIVE Sensitive     TETRACYCLINE <=1 SENSITIVE Sensitive     VANCOMYCIN 1 SENSITIVE Sensitive     TRIMETH/SULFA <=10 SENSITIVE Sensitive     CLINDAMYCIN <=0.25 SENSITIVE Sensitive     RIFAMPIN <=0.5 SENSITIVE Sensitive     Inducible Clindamycin NEGATIVE Sensitive     * RARE STAPHYLOCOCCUS AUREUS  Surgical PCR screen     Status: Abnormal   Collection Time: 06/11/19  8:49 PM   Specimen: Nasal Mucosa; Nasal Swab  Result Value Ref Range Status   MRSA, PCR NEGATIVE NEGATIVE Final   Staphylococcus aureus POSITIVE (A) NEGATIVE Final    Comment: (NOTE) The Xpert SA Assay (FDA approved for NASAL specimens in patients 68 years of age and older), is one component of a comprehensive surveillance program. It is not intended to diagnose infection nor to guide or monitor treatment. Performed at [facility], [place] [address], [place], [place] [address]   Culture, blood (routine x 2)     Status: Abnormal   Collection Time: 06/13/19 11:53 AM   Specimen: BLOOD LEFT HAND  Result Value Ref Range Status   Specimen Description   Final    BLOOD LEFT HAND Performed at [place] [address], [place], [place]  [address]    Special Requests   Final    BOTTLES DRAWN AEROBIC AND ANAEROBIC Blood Culture adequate volume Performed at [place] [address], [place], [place] [address]    Culture  Setup Time   Final    GRAM POSITIVE COCCI IN CLUSTERS AEROBIC BOTTLE ONLY CRITICAL RESULT CALLED TO, READ BACK BY AND VERIFIED WITH: PHARMD C SHADE [id number] AT 90 AM BY CM    Culture (A)  Final    STAPHYLOCOCCUS AUREUS SUSCEPTIBILITIES PERFORMED ON PREVIOUS CULTURE WITHIN THE LAST 5 DAYS. Performed at [facility], [place] [address], [place], [place] [address]    Report Status 06/15/2019 FINAL  Final  Culture, blood (routine x 2)     Status: Abnormal   Collection Time: 06/13/19 11:53 AM   Specimen: BLOOD RIGHT HAND  Result Value Ref Range Status   Specimen Description   Final    BLOOD RIGHT HAND Performed at [place] [address], [place], [place] [address]    Special Requests   Final    BOTTLES DRAWN AEROBIC AND ANAEROBIC Blood Culture results may not be optimal due to an inadequate volume of blood received in culture bottles Performed at [place] [address] [place], [place] [address]    Culture  Setup Time   Final    GRAM POSITIVE COCCI IN CLUSTERS AEROBIC BOTTLE ONLY CRITICAL RESULT CALLED TO, READ BACK BY AND VERIFIED WITH: PHARMD C SHADE [id number] AT 38 AM BY CM    Culture (A)  Final    STAPHYLOCOCCUS AUREUS SUSCEPTIBILITIES PERFORMED ON PREVIOUS CULTURE WITHIN THE LAST 5 DAYS. Performed at [facility], [place] [address], [place], [place] [address]    Report Status 06/15/2019 FINAL  Final  Culture, blood (Routine X 2) w Reflex to ID Panel     Status: None (Preliminary result)   Collection Time: 06/16/19  4:33 PM  Specimen: BLOOD LEFT HAND  Result Value Ref Range Status   Specimen Description BLOOD LEFT HAND  Final   Special Requests   Final    BOTTLES DRAWN AEROBIC ONLY Blood Culture adequate  volume Performed at [place] [address], [place], [place] [address]    Culture NO GROWTH < 24 HOURS  Final   Report Status PENDING  Incomplete  Culture, blood (Routine X 2) w Reflex to ID Panel     Status: None (Preliminary result)   Collection Time: 06/16/19  4:40 PM   Specimen: BLOOD LEFT HAND  Result Value Ref Range Status   Specimen Description BLOOD LEFT HAND  Final   Special Requests   Final    BOTTLES DRAWN AEROBIC ONLY Blood Culture results may not be optimal due to an inadequate volume of blood received in culture bottles Performed at [facility] [place] [address] [place], [place] [address]    Culture NO GROWTH < 24 HOURS  Final   Report Status PENDING  Incomplete         Radiology Studies: MR BRAIN WO CONTRAST  Result Date: 06/16/2019 CLINICAL DATA:  Headache.  IV drug abuse. EXAM: MRI HEAD WITHOUT CONTRAST TECHNIQUE: Multiplanar, multiecho pulse sequences of the brain and surrounding structures were obtained without intravenous contrast. COMPARISON:  None. FINDINGS: Incomplete study. The patient refused to complete the study. T1, T2, diffusion, and susceptibility weighting images were obtained before the study was terminated Ventricle size is normal. No infarct mass or edema. Normal arterial flow voids. Negative calvarium.  Paranasal sinuses clear.  Negative orbit. IMPRESSION: Incomplete study.  No significant abnormality detected. Electronically Signed   By: Franchot Gallo M.D.   On: 06/16/2019 19:17        Scheduled Meds: . enoxaparin (LOVENOX) injection  40 mg Subcutaneous Q24H  . methadone  10 mg Oral Q12H  . nicotine  21 mg Transdermal Daily  . potassium chloride  40 mEq Oral Q4H  . QUEtiapine  50 mg Oral QHS  . saccharomyces boulardii  250 mg Oral BID   Continuous Infusions: . sodium chloride 75 mL/hr at 06/17/19 0949  . magnesium sulfate bolus IVPB    . nafcillin (NAFCIL) continuous infusion 12 g (06/17/19  1708)     LOS: 2 days    Time spent: 40 minutes    Irine Seal, MD [facility]   To contact the attending provider between 7A-7P or the covering provider during after hours 7P-7A, please log into the web site [email or web address] and access using universal [place] password for that web site. If you do not have the password, please call the hospital operator.  06/17/2019, 8:44 PM

## 2019-06-17 NOTE — Progress Notes (Signed)
ID PROGRESS NOTE   29yo F with disseminated MSSA bacteremia, dual valve endocarditis, pulmonary septic emboli, splenic emboli.  Afebrile, but WBC still remains elevated at 20K, blood cx NGTD at 24hr.  Plan: Continue with nafcillin for now Will follow blood cx to ensure she is clearing her bacteremia  Caren Griffins B. [facility] [phone]

## 2019-06-18 ENCOUNTER — Inpatient Hospital Stay: Payer: Self-pay

## 2019-06-18 LAB — CBC WITH DIFFERENTIAL/PLATELET
Abs Immature Granulocytes: 0.17 10*3/uL — ABNORMAL HIGH (ref 0.00–0.07)
Basophils Absolute: 0 10*3/uL (ref 0.0–0.1)
Basophils Relative: 0 %
Eosinophils Absolute: 0.1 10*3/uL (ref 0.0–0.5)
Eosinophils Relative: 0 %
HCT: 31.3 % — ABNORMAL LOW (ref 36.0–46.0)
Hemoglobin: 10.6 g/dL — ABNORMAL LOW (ref 12.0–15.0)
Immature Granulocytes: 1 %
Lymphocytes Relative: 12 %
Lymphs Abs: 2 10*3/uL (ref 0.7–4.0)
MCH: 28 pg (ref 26.0–34.0)
MCHC: 33.9 g/dL (ref 30.0–36.0)
MCV: 82.6 fL (ref 80.0–100.0)
Monocytes Absolute: 1 10*3/uL (ref 0.1–1.0)
Monocytes Relative: 6 %
Neutro Abs: 13.7 10*3/uL — ABNORMAL HIGH (ref 1.7–7.7)
Neutrophils Relative %: 81 %
Platelets: 308 10*3/uL (ref 150–400)
RBC: 3.79 MIL/uL — ABNORMAL LOW (ref 3.87–5.11)
RDW: 15.9 % — ABNORMAL HIGH (ref 11.5–15.5)
WBC: 17.3 10*3/uL — ABNORMAL HIGH (ref 4.0–10.5)
nRBC: 0 % (ref 0.0–0.2)

## 2019-06-18 LAB — BASIC METABOLIC PANEL
Anion gap: 7 (ref 5–15)
BUN: <5 mg/dL — ABNORMAL LOW (ref 6–20)
CO2: 24 mmol/L (ref 22–32)
Calcium: 7.4 mg/dL — ABNORMAL LOW (ref 8.9–10.3)
Chloride: 109 mmol/L (ref 98–111)
Creatinine, Ser: 0.46 mg/dL (ref 0.44–1.00)
GFR calc Af Amer: >60 mL/min (ref >60)
GFR calc non Af Amer: >60 mL/min (ref >60)
Glucose, Bld: 111 mg/dL — ABNORMAL HIGH (ref 70–99)
Potassium: 3.4 mmol/L — ABNORMAL LOW (ref 3.5–5.1)
Sodium: 140 mmol/L (ref 135–145)

## 2019-06-18 LAB — MAGNESIUM: Magnesium: 2 mg/dL (ref 1.7–2.4)

## 2019-06-18 MED ORDER — POTASSIUM CHLORIDE CRYS ER 20 MEQ PO TBCR
40.0000 meq | EXTENDED_RELEASE_TABLET | Freq: Once | ORAL | Status: AC
  Administered 2019-06-18: 40 meq via ORAL
  Filled 2019-06-18: qty 2

## 2019-06-18 MED ORDER — SODIUM CHLORIDE 0.9% FLUSH
10.0000 mL | Freq: Two times a day (BID) | INTRAVENOUS | Status: DC
  Administered 2019-06-18 – 2019-06-19 (×2): 10 mL

## 2019-06-18 MED ORDER — QUETIAPINE FUMARATE 50 MG PO TABS
150.0000 mg | ORAL_TABLET | Freq: Every evening | ORAL | Status: DC | PRN
  Administered 2019-06-18 – 2019-06-29 (×12): 150 mg via ORAL
  Filled 2019-06-18 (×12): qty 1

## 2019-06-18 MED ORDER — SODIUM CHLORIDE 0.9% FLUSH
10.0000 mL | INTRAVENOUS | Status: DC | PRN
  Administered 2019-06-19 – 2019-06-26 (×2): 10 mL

## 2019-06-18 MED ORDER — CHLORHEXIDINE GLUCONATE CLOTH 2 % EX PADS
6.0000 | MEDICATED_PAD | Freq: Every day | CUTANEOUS | Status: DC
  Administered 2019-06-18 – 2019-06-30 (×13): 6 via TOPICAL

## 2019-06-18 NOTE — Progress Notes (Addendum)
PROGRESS NOTE    Michelle Doyle  [id number] DOB: [birth year] DOA: 06/15/2019 PCP: Patient, No Pcp Per    Chief Complaint  Patient presents with   Abnormal Lab    Brief Narrative: (Start on day 1 of progress note - keep it brief and live) 29 year old with PMH significant for IV heroin abuse, Untreated Hepatitis C, initially admitted for acute heroin withdrawal and sepsis. Patient was transfer to [facility] 4/12 for TEE, but it was cancel due to severe anemia, Hb at 6. Patient Decided left AMA from [facility] Her Grandfather brought her back to [place].    Patient was found to have disseminated MSSA Bacteremia, mitral valve and tricuspid valve endocarditis, pulmonary septic emboli, splenic emboli, probable septic arthritis of the left knee.  ID following.    Assessment & Plan:   Active Problems:   Endocarditis   Hypokalemia   Septic arthritis ([place])   Splenic infarct   Bacteremia due to methicillin susceptible Staphylococcus aureus (MSSA)   IV drug abuse ([place])   Tobacco abuse   Positive hepatitis C antibody test  #1 disseminated MSSA bacteremia Patient noted to have disseminated MSSA bacteremia with pulmonary septic emboli, splenic emboli, probable septic arthritis of the left knee, tricuspid and mitral valve endocarditis.  MRI of the brain was obtained incomplete study patient refused to complete the study however no significant abnormality detected.  2D echo done with thickening tricuspid valve 1.1 x 1.7 cm vegetation, tricuspid valve mild to moderate regurgitation, thickened mitral valve cannot rule out vegetation, abnormal mitral valve.  Blood cultures from 06/11/2019 were growing staph aureus.  Repeat blood cultures 06/13/2019 with cocci in clusters.  Repeat cultures obtained 13 2021 pending with no growth to date.  Continue IV nafcillin will likely need 6 weeks of IV antibiotics.  ID following and appreciate input and recommendations.  2.  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  Patient with MSSA bacteremia.  Continue IV nafcillin.  Per ID.  3.  Septic emboli/splenic infarct IV nafcillin.  4.  Left knee septic arthritis Status post I&D of the left knee.  Continue IV nafcillin.  Pain management.  Supportive care.  5.  Opiate abuse Patient uses IV heroin.  Patient states was at the methadone clinic and was getting methadone 35 mg daily and requesting her current dose be increased.  Patient currently on methadone 10 mg twice a day.  Pharmacy contacted methadone clinic and per pharmacist patient noted not to be in the system although patient adamant she goes there.  We will continue current dose of methadone for now.  Continue the clonidine detox protocol.  Continue current regimen of Percocet for pain as needed.    6.  Untreated/positive hepatitis C HCV RNA pending.  ID following.  7.  hypokalemia Potassium at 3.4 this morning.  K. Dur 40 mEq p.o. x1.  Magnesium currently at 2.0.  Follow.    8.  Tobacco abuse Nicotine patch.  Tobacco cessation stressed to patient.  9.  Thrombocytopenia Resolved.  10.  Iron deficiency anemia Status post 2 units packed red blood cells on 06/15/2019.  Hemoglobin currently at 10.6.  Will likely need oral iron supplementation on discharge.  May consider IV iron during this hospitalization however will discuss with ID.    DVT prophylaxis: Lovenox Code Status: Full Family Communication: Updated patient and grandfather  at bedside. Disposition:   Status is: Inpatient    Dispo: The patient is from: Home              Anticipated d/c is to: Likely home              Anticipated d/c date is: In approximately 5 weeks.              Patient currently on IV nafcillin and will need to complete 6 weeks of therapy prior to discharge as patient with IV drug  abuse.        Consultants:  Infectious disease: Dr. Graylon Good  Procedures:  MRI brain from 06/16/2019   Antimicrobials:  IV nafcillin 06/15/2019 (D8/42) >>>>>   Subjective: Patient sitting up in bed.  States she is feeling better.  Denies any chest pain.  No shortness of breath.  Denies any abdominal pain.  Patient does state she has been on's Seroquel 150 mg nightly before in the past and requesting it to be reordered.  Patient adamant that she does go to the methadone clinic and is on 35 mg of methadone daily although per pharmacy patient noted not to be in their system.  Patient with formed stool and no further diarrhea per RN  Objective: Vitals:   06/17/19 1338 06/17/19 2136 06/18/19 0521 06/18/19 1254  BP: 130/88 130/90 120/83 122/79  Pulse: 92 94 96 (!) 106  Resp: 19  18 18   Temp: 98.8 F (37.1 C) (!) 100.8 F (38.2 C) 98.3 F (36.8 C) 98.4 F (36.9 C)  TempSrc: Oral Oral Oral Oral  SpO2: 100% 97% 99% 100%  Weight:      Height:        Intake/Output Summary (Last 24 hours) at 06/18/2019 1910 Last data filed at 06/18/2019 1700 Gross per 24 hour  Intake 4044.08 ml  Output --  Net 4044.08 ml   Filed Weights   06/16/19 0110 06/16/19 0816  Weight: 49.9 kg 65.2 kg    Examination:  General exam: NAD Respiratory system: Some scattered coarse breath sounds.  No wheezing.  No crackles.  Speaking in full sentences.  Normal respiratory effort.  Cardiovascular system: Regular rate and rhythm no murmurs rubs or gallops.  No JVD.  No lower extremity edema.  Gastrointestinal system: Abdomen is obese, soft, nontender, nondistended, positive bowel sounds.  No rebound.  No guarding.  Central nervous system: Alert and oriented. No focal neurological deficits. Extremities: Left knee bandaged.  Skin: No rashes, lesions or ulcers Psychiatry: Judgement and insight appear normal. Mood & affect appropriate.     Data Reviewed: I have personally reviewed following labs and imaging  studies  CBC: Recent Labs  Lab 06/14/19 0838 06/14/19 0838 06/15/19 0756 06/15/19 1421 06/16/19 0500 06/17/19 1211 06/18/19 1032  WBC 17.9*   < > 18.8* 19.5* 19.5* 20.0* 17.3*  NEUTROABS 14.2*  --  14.4* 16.3*  --  15.5* 13.7*  HGB 7.7*   < > 6.5* 6.8* 11.7* 10.5* 10.6*  HCT 24.8*   < > 18.6* 20.9* 35.7* 30.8* 31.3*  MCV 85.5   < > 75.9* 80.4 84.8 80.6 82.6  PLT 188   < > 216 232 268 293 308   < > = values in this interval not displayed.    Basic Metabolic Panel: Recent Labs  Lab 06/13/19 1153 06/14/19 0838 06/15/19 0756 06/15/19 1421 06/15/19 1520 06/16/19 0500 06/17/19 1211 06/18/19 1032  NA 141   < > 138 135  --  140 139 140  K 2.6*   < > 3.7 3.0*  --  3.9 3.0* 3.4*  CL 108   < > 106 102  --  108 108 109  CO2 24   < > 23 23  --  22 23 24   GLUCOSE 88   < > 87 153*  --  102* 91 111*  BUN 17   < > 6 7  --  <5* <5* <5*  CREATININE 0.66   < > 0.48 0.53  --  0.51 0.45 0.46  CALCIUM 7.6*   < > 7.0* 7.1*  --  7.5* 7.3* 7.4*  MG 2.5*  --   --   --  2.0  --  1.7 2.0   < > = values in this interval not displayed.    GFR: Estimated Creatinine Clearance: 103.6 mL/min (by C-G formula based on SCr of 0.46 mg/dL).  Liver Function Tests: Recent Labs  Lab 06/16/19 0500 06/17/19 1211  AST 20 18  ALT 11 12  ALKPHOS 72 61  BILITOT 0.9 0.8  PROT 6.7 6.4*  ALBUMIN 1.8* 1.6*    CBG: No results for input(s): GLUCAP in the last 168 hours.   Recent Results (from the past 240 hour(s))  Culture, blood (Routine x 2)     Status: Abnormal   Collection Time: 06/11/19 12:05 AM   Specimen: BLOOD  Result Value Ref Range Status   Specimen Description   Final    BLOOD BLOOD LEFT HAND Performed at [facility] [address], [place], [place] [address]    Special Requests   Final    BOTTLES DRAWN AEROBIC ONLY Blood Culture adequate volume Performed at [facility] [address] [place], [place] [address]    Culture  Setup Time   Final     GRAM POSITIVE COCCI IN CLUSTERS AEROBIC BOTTLE ONLY CRITICAL VALUE NOTED.  VALUE IS CONSISTENT WITH PREVIOUSLY REPORTED AND CALLED VALUE.    Culture (A)  Final    STAPHYLOCOCCUS AUREUS SUSCEPTIBILITIES PERFORMED ON PREVIOUS CULTURE WITHIN THE LAST 5 DAYS. Performed at [facility], [place] [address] [place], [place] [address]    Report Status 06/13/2019 FINAL  Final  Urine culture     Status: Abnormal   Collection Time: 06/11/19 12:05 AM   Specimen: In/Out Cath Urine  Result Value Ref Range Status   Specimen Description   Final    IN/OUT CATH URINE Performed at [facility] [address] [place], [place] [address]    Special Requests   Final    NONE Performed at [facility], [place] [address], [place], [place] [address]    Culture (A)  Final    >=100,000 COLONIES/mL KOCURIA SPECIES 10,000 COLONIES/mL STAPHYLOCOCCUS AUREUS Standardized susceptibility testing for this organism is not available. FOR KOCURIA SPECIES Performed at [facility], [place] [address], [place], [place] [address]    Report Status 06/13/2019 FINAL  Final   Organism ID, Bacteria STAPHYLOCOCCUS AUREUS (A)  Final      Susceptibility   Staphylococcus aureus - MIC*    CIPROFLOXACIN >=8 RESISTANT Resistant     GENTAMICIN <=0.5 SENSITIVE Sensitive     NITROFURANTOIN <=16 SENSITIVE Sensitive     OXACILLIN 0.5 SENSITIVE Sensitive     TETRACYCLINE <=1 SENSITIVE Sensitive     VANCOMYCIN 1 SENSITIVE Sensitive     TRIMETH/SULFA <=10 SENSITIVE Sensitive     CLINDAMYCIN <=0.25 SENSITIVE Sensitive     RIFAMPIN <=0.5 SENSITIVE  Sensitive     Inducible Clindamycin NEGATIVE Sensitive     * 10,000 COLONIES/mL STAPHYLOCOCCUS AUREUS  Culture, blood (Routine x 2)     Status: Abnormal   Collection Time: 06/11/19 12:10 AM   Specimen: BLOOD  Result Value Ref Range Status   Specimen Description   Final    BLOOD BLOOD RIGHT FOREARM Performed at [facility] [address] [place], [place] [address]    Special Requests   Final    BOTTLES DRAWN AEROBIC ONLY Blood Culture adequate volume Performed at [place] [facility] [address] [place], [place] [address]    Culture  Setup Time   Final    GRAM POSITIVE COCCI IN CLUSTERS AEROBIC BOTTLE ONLY CRITICAL RESULT CALLED TO, READ BACK BY AND VERIFIED WITH: Odem [id number] FCP Performed at [facility], [place] [address], [place], [place] [address]    Culture STAPHYLOCOCCUS AUREUS (A)  Final   Report Status 06/13/2019 FINAL  Final   Organism ID, Bacteria STAPHYLOCOCCUS AUREUS  Final      Susceptibility   Staphylococcus aureus - MIC*    CIPROFLOXACIN >=8 RESISTANT Resistant     ERYTHROMYCIN >=8 RESISTANT Resistant     GENTAMICIN <=0.5 SENSITIVE Sensitive     OXACILLIN 0.5 SENSITIVE Sensitive     TETRACYCLINE <=1 SENSITIVE Sensitive     VANCOMYCIN 1 SENSITIVE Sensitive     TRIMETH/SULFA <=10 SENSITIVE Sensitive     CLINDAMYCIN <=0.25 SENSITIVE Sensitive     RIFAMPIN <=0.5 SENSITIVE Sensitive     Inducible Clindamycin NEGATIVE Sensitive     * STAPHYLOCOCCUS AUREUS  Blood Culture ID Panel (Reflexed)     Status: Abnormal   Collection Time: 06/11/19 12:10 AM  Result Value Ref Range Status   Enterococcus species NOT DETECTED NOT DETECTED Final   Listeria monocytogenes NOT DETECTED NOT DETECTED Final   Staphylococcus species DETECTED (A) NOT DETECTED Final    Comment: CRITICAL RESULT CALLED TO, READ BACK BY AND VERIFIED WITH: PHARMD MICHELLE B. [id number] TO:[id number] FCP    Staphylococcus aureus (BCID) DETECTED (A) NOT DETECTED Final    Comment: Methicillin (oxacillin) susceptible Staphylococcus aureus (MSSA). Preferred therapy is anti staphylococcal beta lactam antibiotic (Cefazolin or Nafcillin), unless clinically contraindicated. CRITICAL RESULT CALLED TO, READ BACK BY AND VERIFIED WITH: PHARMD MICHELLE B. [id number] TO:[id number] FCP    Methicillin resistance NOT DETECTED NOT  DETECTED Final   Streptococcus species NOT DETECTED NOT DETECTED Final   Streptococcus agalactiae NOT DETECTED NOT DETECTED Final   Streptococcus pneumoniae NOT DETECTED NOT DETECTED Final   Streptococcus pyogenes NOT DETECTED NOT DETECTED Final   Acinetobacter baumannii NOT DETECTED NOT DETECTED Final   Enterobacteriaceae species NOT DETECTED NOT DETECTED Final   Enterobacter cloacae complex NOT DETECTED NOT DETECTED Final   Escherichia coli NOT DETECTED NOT DETECTED Final   Klebsiella oxytoca NOT DETECTED NOT DETECTED Final   Klebsiella pneumoniae NOT DETECTED NOT DETECTED Final   Proteus species NOT DETECTED NOT DETECTED Final   Serratia marcescens NOT DETECTED NOT DETECTED Final   Haemophilus influenzae NOT DETECTED NOT DETECTED Final   Neisseria meningitidis NOT DETECTED NOT DETECTED Final   Pseudomonas aeruginosa NOT DETECTED NOT DETECTED Final   Candida albicans NOT DETECTED NOT DETECTED Final   Candida glabrata NOT DETECTED NOT DETECTED Final   Candida krusei NOT DETECTED NOT DETECTED Final   Candida parapsilosis NOT DETECTED NOT DETECTED Final   Candida tropicalis NOT DETECTED NOT DETECTED Final    Comment:  Performed at [facility], [place] [address] [place], [place] [address]  SARS CORONAVIRUS 2 (TAT 6-24 HRS) Nasopharyngeal Nasopharyngeal Swab     Status: None   Collection Time: 06/11/19 12:52 AM   Specimen: Nasopharyngeal Swab  Result Value Ref Range Status   SARS Coronavirus 2 NEGATIVE NEGATIVE Final    Comment: (NOTE) SARS-CoV-2 target nucleic acids are NOT DETECTED. The SARS-CoV-2 RNA is generally detectable in upper and lower respiratory specimens during the acute phase of infection. Negative results do not preclude SARS-CoV-2 infection, do not rule out co-infections with other pathogens, and should not be used as the sole basis for treatment or other patient management decisions. Negative results must be combined with clinical observations, patient history,  and epidemiological information. The expected result is Negative. Fact Sheet for Patients: SugarRoll.be Fact Sheet for Healthcare Providers: https://www.woods-mathews.com/ This test is not yet approved or cleared by the [place] FDA and  has been authorized for detection and/or diagnosis of SARS-CoV-2 by FDA under an Emergency Use Authorization (EUA). This EUA will remain  in effect (meaning this test can be used) for the duration of the COVID-19 declaration under Section 56 4(b)(1) of the Act, 21 U.S.C. section 360bbb-3(b)(1), unless the authorization is terminated or revoked sooner. Performed at [facility], [place] [address], [place], [place] [address]   Respiratory Panel by RT PCR (Flu A&B, Covid) - Nasopharyngeal Swab     Status: None   Collection Time: 06/11/19  5:25 AM   Specimen: Nasopharyngeal Swab  Result Value Ref Range Status   SARS Coronavirus 2 by RT PCR NEGATIVE NEGATIVE Final    Comment: (NOTE) SARS-CoV-2 target nucleic acids are NOT DETECTED. The SARS-CoV-2 RNA is generally detectable in upper respiratoy specimens during the acute phase of infection. The lowest concentration of SARS-CoV-2 viral copies this assay can detect is 131 copies/mL. A negative result does not preclude SARS-Cov-2 infection and should not be used as the sole basis for treatment or other patient management decisions. A negative result may occur with  improper specimen collection/handling, submission of specimen other than nasopharyngeal swab, presence of viral mutation(s) within the areas targeted by this assay, and inadequate number of viral copies (<131 copies/mL). A negative result must be combined with clinical observations, patient history, and epidemiological information. The expected result is Negative. Fact Sheet for Patients:  PinkCheek.be Fact Sheet for Healthcare Providers:   GravelBags.it This test is not yet ap proved or cleared by the [place] FDA and  has been authorized for detection and/or diagnosis of SARS-CoV-2 by FDA under an Emergency Use Authorization (EUA). This EUA will remain  in effect (meaning this test can be used) for the duration of the COVID-19 declaration under Section 564(b)(1) of the Act, 21 U.S.C. section 360bbb-3(b)(1), unless the authorization is terminated or revoked sooner.    Influenza A by PCR NEGATIVE NEGATIVE Final   Influenza B by PCR NEGATIVE NEGATIVE Final    Comment: (NOTE) The Xpert Xpress SARS-CoV-2/FLU/RSV assay is intended as an aid in  the diagnosis of influenza from Nasopharyngeal swab specimens and  should not be used as a sole basis for treatment. Nasal washings and  aspirates are unacceptable for Xpert Xpress SARS-CoV-2/FLU/RSV  testing. Fact Sheet for Patients: PinkCheek.be Fact Sheet for Healthcare Providers: GravelBags.it This test is not yet approved or cleared by the [place] FDA and  has been authorized for detection and/or diagnosis of SARS-CoV-2 by  FDA under an Emergency Use Authorization (EUA). This EUA  will remain  in effect (meaning this test can be used) for the duration of the  Covid-19 declaration under Section 564(b)(1) of the Act, 21  U.S.C. section 360bbb-3(b)(1), unless the authorization is  terminated or revoked. Performed at [facility], [place] [address], [place], [place] [address]   Body fluid culture     Status: None   Collection Time: 06/11/19  2:03 PM   Specimen: Synovium; Synovial Fluid  Result Value Ref Range Status   Specimen Description SYNOVIAL FLUID KNEE LEFT  Final   Special Requests   Final    NONE Performed at [place] [address] [place], [place] [address]    Gram Stain   Final    ABUNDANT WBC PRESENT,BOTH PMN AND  MONONUCLEAR NO ORGANISMS SEEN Gram Stain Report Called to,Read Back By and Verified With: DR. Lyla Glassing AT 1512 ON 06/11/19 BY N.Tanika Bracco Performed at [facility], [place] [address], [place], [place] [address]    Culture   Final    NO GROWTH 3 DAYS Performed at [facility], [place] [address], [place], [place] [address]    Report Status 06/14/2019 FINAL  Final  Anaerobic culture     Status: None   Collection Time: 06/11/19  2:03 PM   Specimen: Synovium; Synovial Fluid  Result Value Ref Range Status   Specimen Description SYNOVIAL FLUID KNEE LEFT  Final   Special Requests   Final    NONE Performed at [facility] [address] [place] [address] [place], [place] [address]    Culture   Final    NO ANAEROBES ISOLATED Performed at [facility], [place] [address] [place], [place] [address]    Report Status 06/16/2019 FINAL  Final  Aerobic/Anaerobic Culture (surgical/deep wound)     Status: None   Collection Time: 06/11/19  6:30 PM   Specimen: Synovial, Left Knee; Body Fluid  Result Value Ref Range Status   Specimen Description   Final    SYNOVIAL KNEE LEFT Performed at [facility] [address] [place], [place] [address]    Special Requests   Final    NONE Performed at [facility], [place] [address], [place], [place] [address]    Gram Stain   Final    RARE WBC PRESENT, PREDOMINANTLY PMN NO ORGANISMS SEEN    Culture   Final    RARE STAPHYLOCOCCUS AUREUS NO ANAEROBES ISOLATED Performed at [facility], [place] [address] [place],  [address]    Report Status 06/16/2019 FINAL  Final   Organism ID, Bacteria STAPHYLOCOCCUS AUREUS  Final      Susceptibility   Staphylococcus aureus - MIC*    CIPROFLOXACIN >=8 RESISTANT Resistant     ERYTHROMYCIN >=8 RESISTANT Resistant     GENTAMICIN <=0.5 SENSITIVE Sensitive     OXACILLIN 0.5 SENSITIVE Sensitive     TETRACYCLINE <=1 SENSITIVE Sensitive     VANCOMYCIN  <=0.5 SENSITIVE Sensitive     TRIMETH/SULFA <=10 SENSITIVE Sensitive     CLINDAMYCIN <=0.25 SENSITIVE Sensitive     RIFAMPIN <=0.5 SENSITIVE Sensitive     Inducible Clindamycin NEGATIVE Sensitive     * RARE STAPHYLOCOCCUS AUREUS  Aerobic/Anaerobic Culture (surgical/deep wound)     Status: None   Collection Time: 06/11/19  6:32 PM   Specimen: Synovium; Tissue  Result Value Ref Range Status   Specimen Description   Final    SYNOVIAL TISSUE KNEE LEFT Performed at [facility], [place] [address] [place], [place] [address]    Special Requests   Final    NONE Performed at [facility], [place] [address] [place], [place] [address]    Gram Stain NO WBC SEEN NO ORGANISMS SEEN   Final   Culture   Final    RARE STAPHYLOCOCCUS AUREUS NO ANAEROBES ISOLATED Performed at [facility], [place] [address], [place], [place] [address]    Report Status 06/17/2019 FINAL  Final   Organism ID, Bacteria STAPHYLOCOCCUS AUREUS  Final      Susceptibility   Staphylococcus aureus - MIC*    CIPROFLOXACIN >=8 RESISTANT Resistant     ERYTHROMYCIN >=8 RESISTANT Resistant     GENTAMICIN <=0.5 SENSITIVE Sensitive     OXACILLIN 0.5 SENSITIVE Sensitive     TETRACYCLINE <=1 SENSITIVE Sensitive     VANCOMYCIN 1 SENSITIVE Sensitive     TRIMETH/SULFA <=10 SENSITIVE Sensitive     CLINDAMYCIN <=0.25 SENSITIVE Sensitive     RIFAMPIN <=0.5 SENSITIVE Sensitive     Inducible Clindamycin NEGATIVE Sensitive     * RARE STAPHYLOCOCCUS AUREUS  Surgical PCR screen     Status: Abnormal   Collection Time: 06/11/19  8:49 PM   Specimen: Nasal Mucosa; Nasal Swab  Result Value Ref Range Status   MRSA, PCR NEGATIVE NEGATIVE Final   Staphylococcus aureus POSITIVE (A) NEGATIVE Final    Comment: (NOTE) The Xpert SA Assay (FDA approved for NASAL specimens in patients 38 years of age and older), is one component of a comprehensive surveillance program. It is not intended to diagnose infection  nor to guide or monitor treatment. Performed at [facility], [place] [address] [place], [place] [address]   Culture, blood (routine x 2)     Status: Abnormal   Collection Time: 06/13/19 11:53 AM   Specimen: BLOOD LEFT HAND  Result Value Ref Range Status   Specimen Description   Final    BLOOD LEFT HAND Performed at [facility] [address] [place], [place] [address]    Special Requests   Final    BOTTLES DRAWN AEROBIC AND ANAEROBIC Blood Culture adequate volume Performed at [facility] [address], [place], [place] [address]    Culture  Setup Time   Final    GRAM POSITIVE COCCI IN CLUSTERS AEROBIC BOTTLE ONLY CRITICAL RESULT CALLED TO, READ BACK BY AND VERIFIED WITH: PHARMD C SHADE [id number] AT 41 AM BY CM    Culture (A)  Final    STAPHYLOCOCCUS AUREUS SUSCEPTIBILITIES PERFORMED ON PREVIOUS CULTURE WITHIN THE LAST 5 DAYS. Performed at [facility], [place] [address] [place], [place] [address]    Report Status 06/15/2019 FINAL  Final  Culture, blood (routine x 2)     Status: Abnormal   Collection Time: 06/13/19 11:53 AM   Specimen: BLOOD RIGHT HAND  Result Value Ref Range Status   Specimen Description   Final    BLOOD RIGHT HAND Performed at [facility] [address] [place],  [address]    Special Requests   Final    BOTTLES DRAWN AEROBIC AND ANAEROBIC Blood Culture results may not be optimal due to an inadequate volume of blood received in culture bottles Performed at [facility] [address] [place], [place] [address]    Culture  Setup Time   Final    GRAM POSITIVE COCCI IN CLUSTERS AEROBIC BOTTLE ONLY CRITICAL RESULT CALLED TO,  READ BACK BY AND VERIFIED WITH: PHARMD C SHADE [id number] AT 83 AM BY CM    Culture (A)  Final    STAPHYLOCOCCUS AUREUS SUSCEPTIBILITIES PERFORMED ON PREVIOUS CULTURE WITHIN THE LAST 5 DAYS. Performed at [place], [place] [address], [place], [place] [address]    Report Status 06/15/2019 FINAL  Final  Culture, blood (Routine X 2) w Reflex to ID Panel     Status: None (Preliminary result)   Collection Time: 06/16/19  4:33 PM   Specimen: BLOOD LEFT HAND  Result Value Ref Range Status   Specimen Description   Final    BLOOD LEFT HAND Performed at [facility] [address] [place] [address] [place], [place] [address]    Special Requests   Final    BOTTLES DRAWN AEROBIC ONLY Blood Culture adequate volume Performed at [facility] [address], [place], [place] [address]    Culture   Final    NO GROWTH 2 DAYS Performed at [facility] [address] [place], [place] [address]    Report Status PENDING  Incomplete  Culture, blood (Routine X 2) w Reflex to ID Panel     Status: None (Preliminary result)   Collection Time: 06/16/19  4:40 PM   Specimen: BLOOD LEFT HAND  Result Value Ref Range Status   Specimen Description   Final    BLOOD LEFT HAND Performed at [facility] [address] [place], [place] [address]    Special Requests   Final    BOTTLES DRAWN AEROBIC ONLY Blood Culture results may not be optimal due to an inadequate volume of blood received in culture bottles Performed at [facility] [address] [place], [place] [address]    Culture   Final    NO GROWTH 2 DAYS Performed at [facility] [address], [place],  [address]    Report Status PENDING  Incomplete         Radiology Studies: [place] EKG SITE RITE  Result Date: 06/18/2019 If Site Rite image not attached, placement could not be confirmed due to current cardiac rhythm.       Scheduled Meds:  Chlorhexidine Gluconate Cloth  6 each Topical Daily   enoxaparin (LOVENOX) injection  40 mg Subcutaneous Q24H   methadone  10 mg Oral Q12H   nicotine  21 mg Transdermal Daily   saccharomyces boulardii  250 mg Oral BID   sodium chloride  flush  10-40 mL Intracatheter Q12H   Continuous Infusions:  nafcillin (NAFCIL) continuous infusion 12 g (06/18/19 1841)     LOS: 3 days    Time spent: 40 minutes    Irine Seal, MD [facility]   To contact the attending provider between 7A-7P or the covering provider during after hours 7P-7A, please log into the web site [email or web address] and access using universal [place] password for that web site. If you do not have the password, please call the hospital operator.  06/18/2019, 7:10 PM

## 2019-06-18 NOTE — Progress Notes (Incomplete Revision)
Pharmacy Antibiotic Note  Michelle Doyle is a 29 y.o. 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 Pharmacy has been consulted for Nafcillin dosing.  Plan: Continue Nafcillin 12 grams IV q24h continuous infusion Plan 6 week course per ID recommendations   Height: 5\' 7"  (170.2 cm) Weight: 65.2 kg (143 lb 11.8 oz) IBW/kg (Calculated) : 61.6  Temp (24hrs), Avg:99.2 F (37.3 C), Min:98.3 F (36.8 C), Max:100.8 F (38.2 C)  Recent Labs  Lab 06/12/19 0605 06/12/19 0849 06/13/19 1153 06/14/19 0838 06/15/19 0756 06/15/19 1421 06/15/19 1540 06/16/19 0500 06/17/19 1211 06/18/19 1032  WBC   < >  --  17.3*   < > 18.8* 19.5*  --  19.5* 20.0* 17.3*  CREATININE   < > 0.70 0.66   < > 0.48 0.53  --  0.51 0.45 0.46  LATICACIDVEN  --  3.0* 2.2*  --   --   --  1.8  --   --   --    < > = values in this interval not displayed.    Estimated Creatinine Clearance: 103.6 mL/min (by C-G formula based on SCr of 0.46 mg/dL).    No Known Allergies  Antimicrobials this admission:  Vancomycin 4/8 >> 4/8 Cefepime 4/8 >> 4/8 Cefazolin 4/8 >> 4/9 Nafcillin 4/9 >>  Microbiology results:  4/8 BCx: MSSA  4/8 UCx: >100K Kocuria sp, 10K MSSA 4/8 COVID/Flu neg 4/8 L knee synovial fluid: MSSA 4/8 MRSA PCR neg, MSSA PCR + 4/8 HIV NR 4/10 BCx: MSSA  4/11 Hep A/B NR, Hep C reactive 4/13 BCx: NGTD 4/14 C.diff PCR: ordered  Thank you for allowing pharmacy to be a part of this patient's care.   Lindell Spar, PharmD, BCPS Clinical Pharmacist  06/18/2019 3:47 PM

## 2019-06-18 NOTE — Progress Notes (Signed)
[facility] for Infectious Disease    Date of Admission:  06/15/2019   Total days of antibiotics 9           ID: Michelle Doyle is a 29 y.o. female with disseminated MSSA infection Active Problems:   Endocarditis   Hypokalemia   Septic arthritis ([name])   Splenic infarct   Bacteremia due to methicillin susceptible Staphylococcus aureus (MSSA)   IV drug abuse ([place])   Tobacco abuse   Positive hepatitis C antibody test    Subjective: Having chills nightsweats at night.denies chest pain, denies shortness of breath She reports that she was going to methadone clinic prior to admit and getting 35mg  daily.  Nurse at bedside using ultrasound to try to find venous access poor peripheral access. Her other piv on right arm is 1/2 way out  and needs picc line  Medications:  . enoxaparin (LOVENOX) injection  40 mg Subcutaneous Q24H  . methadone  10 mg Oral Q12H  . nicotine  21 mg Transdermal Daily  . saccharomyces boulardii  250 mg Oral BID    Objective: Vital signs in last 24 hours: Temp:  [98.3 F (36.8 C)-100.8 F (38.2 C)] 98.4 F (36.9 C) (04/15 1254) Pulse Rate:  [94-106] 106 (04/15 1254) Resp:  [18] 18 (04/15 1254) BP: (120-130)/(79-90) 122/79 (04/15 1254) SpO2:  [97 %-100 %] 100 % (04/15 1254)   Physical Exam  Constitutional:  oriented to person, place, and time. appears well-developed and well-nourished. No distress.  HENT: [place]/AT, PERRLA, no scleral icterus Mouth/Throat: Oropharynx is clear and moist. No oropharyngeal exudate.  Cardiovascular: Normal rate, regular rhythm and normal heart sounds. Exam reveals no gallop and no friction rub.  No murmur heard.  Pulmonary/Chest: Effort normal and breath sounds normal. No respiratory distress.  has no wheezes.  Neck = supple, no nuchal rigidity [id number] knee bandaged. Right leg eschar -mild surround erythema  Lymphadenopathy: no cervical adenopathy. No axillary adenopathy Neurological: alert and oriented to person, place,  and time.  Skin: Skin is warm and dry. No rash noted. No erythema.  Psychiatric: a normal mood and affect.  behavior is normal.    Lab Results Recent Labs    06/17/19 1211 06/18/19 1032  WBC 20.0* 17.3*  HGB 10.5* 10.6*  HCT 30.8* 31.3*  NA 139 140  K 3.0* 3.4*  CL 108 109  CO2 23 24  BUN <5* <5*  CREATININE 0.45 0.46   Liver Panel Recent Labs    06/16/19 0500 06/17/19 1211  PROT 6.7 6.4*  ALBUMIN 1.8* 1.6*  AST 20 18  ALT 11 12  ALKPHOS 72 61  BILITOT 0.9 0.8   Erythrocyte Sedimentation Rate     Component Value Date/Time   ESRSEDRATE 63 (H) 06/14/2019 0737   C-Reactive Protein Recent Labs    06/16/19 0500  CRP 22.2*    Microbiology: Blood cx NGTD at 48hrs Studies/Results: MR BRAIN WO CONTRAST  Result Date: 06/16/2019 CLINICAL DATA:  Headache.  IV drug abuse. EXAM: MRI HEAD WITHOUT CONTRAST TECHNIQUE: Multiplanar, multiecho pulse sequences of the brain and surrounding structures were obtained without intravenous contrast. COMPARISON:  None. FINDINGS: Incomplete study. The patient refused to complete the study. T1, T2, diffusion, and susceptibility weighting images were obtained before the study was terminated Ventricle size is normal. No infarct mass or edema. Normal arterial flow voids. Negative calvarium.  Paranasal sinuses clear.  Negative orbit. IMPRESSION: Incomplete study.  No significant abnormality detected. Electronically Signed   By: Franchot Gallo M.D.  On: 06/16/2019 19:17     Assessment/Plan: Disseminated MSSA bcteremia, dual valve endocarditis, septic pulmonary emboli and splenic emboli  With difficult access= have contacted [facility] line team who can come in and give picc line to patient tonight so that she can continue getting nafcillin  Opiate dependence = consider increasing her methadone since she usually gets 35mg  daily (currently 10mg  bid)    [facility] for Infectious Diseases Cell: [phone] Pager:  [phone]  06/18/2019, 4:50 PM

## 2019-06-18 NOTE — Progress Notes (Signed)
[facility] PPE d/c'd and C-diff request removed, order expired and states "I am not having diarrhea". SRP, RN

## 2019-06-18 NOTE — Plan of Care (Signed)
  Problem: Education: Goal: Knowledge of General Education information will improve Description Including pain rating scale, medication(s)/side effects and non-pharmacologic comfort measures Outcome: Progressing   Problem: Health Behavior/Discharge Planning: Goal: Ability to manage health-related needs will improve Outcome: Progressing   

## 2019-06-18 NOTE — Progress Notes (Signed)
[facility] consulted to obtain IV access as pt's current access is "half way out" beneath dressing. Spoke with pt's nurse, Sophia. Educated that pt's access needs should be discussed during rounds or when physician is next contacted d/t plan for 6 weeks of IV antibiotics according to D. Grandville Silos, MD progress note on 06/17/19. Advised [facility] RN will come now to assess for IV site.

## 2019-06-18 NOTE — Progress Notes (Addendum)
Spoke with Zenia Resides, PICC line insertion planned this afternoon, will administer abts.after placement. SRP, RN

## 2019-06-18 NOTE — Progress Notes (Addendum)
 [facility] NOTE REDACTION - CHART CORRECTION -TIME SENSITIVE   Due to an Incorrect Registration or Identity Theft, we are redacting this note.   We have changed the demographic information to reflect those of the correct patient.  Please review for accuracy and ? Sign the note.  Please do not refresh the body of the note.   Should the change in demographics impact clinical decisions that had been made at the time, you can Edit Note before signing. This will create an Addendum.

## 2019-06-19 LAB — CBC WITH DIFFERENTIAL/PLATELET
Abs Immature Granulocytes: 0.13 10*3/uL — ABNORMAL HIGH (ref 0.00–0.07)
Basophils Absolute: 0.1 10*3/uL (ref 0.0–0.1)
Basophils Relative: 0 %
Eosinophils Absolute: 0.1 10*3/uL (ref 0.0–0.5)
Eosinophils Relative: 0 %
HCT: 29.6 % — ABNORMAL LOW (ref 36.0–46.0)
Hemoglobin: 9.6 g/dL — ABNORMAL LOW (ref 12.0–15.0)
Immature Granulocytes: 1 %
Lymphocytes Relative: 12 %
Lymphs Abs: 2.3 10*3/uL (ref 0.7–4.0)
MCH: 27.8 pg (ref 26.0–34.0)
MCHC: 32.4 g/dL (ref 30.0–36.0)
MCV: 85.8 fL (ref 80.0–100.0)
Monocytes Absolute: 1.1 10*3/uL — ABNORMAL HIGH (ref 0.1–1.0)
Monocytes Relative: 6 %
Neutro Abs: 15.4 10*3/uL — ABNORMAL HIGH (ref 1.7–7.7)
Neutrophils Relative %: 81 %
Platelets: 331 10*3/uL (ref 150–400)
RBC: 3.45 MIL/uL — ABNORMAL LOW (ref 3.87–5.11)
RDW: 16.3 % — ABNORMAL HIGH (ref 11.5–15.5)
WBC: 19 10*3/uL — ABNORMAL HIGH (ref 4.0–10.5)
nRBC: 0 % (ref 0.0–0.2)

## 2019-06-19 LAB — BASIC METABOLIC PANEL
Anion gap: 8 (ref 5–15)
BUN: 6 mg/dL (ref 6–20)
CO2: 26 mmol/L (ref 22–32)
Calcium: 7.8 mg/dL — ABNORMAL LOW (ref 8.9–10.3)
Chloride: 108 mmol/L (ref 98–111)
Creatinine, Ser: 0.46 mg/dL (ref 0.44–1.00)
GFR calc Af Amer: >60 mL/min (ref >60)
GFR calc non Af Amer: >60 mL/min (ref >60)
Glucose, Bld: 100 mg/dL — ABNORMAL HIGH (ref 70–99)
Potassium: 3.5 mmol/L (ref 3.5–5.1)
Sodium: 142 mmol/L (ref 135–145)

## 2019-06-19 LAB — MAGNESIUM: Magnesium: 2 mg/dL (ref 1.7–2.4)

## 2019-06-19 MED ORDER — POTASSIUM CHLORIDE CRYS ER 20 MEQ PO TBCR
40.0000 meq | EXTENDED_RELEASE_TABLET | Freq: Once | ORAL | Status: AC
  Administered 2019-06-19: 40 meq via ORAL
  Filled 2019-06-19: qty 2

## 2019-06-19 NOTE — Progress Notes (Signed)
PROGRESS NOTE    Michelle Doyle  [id number] DOB: 04/28/1991 DOA: 06/15/2019 PCP: Patient, No Pcp Per    Chief Complaint  Patient presents with  . Abnormal Lab    Brief Narrative: (Start on day 1 of progress note - keep it brief and live) 29 year old with PMH significant for IV heroin abuse, Untreated Hepatitis C, initially admitted for acute heroin withdrawal and sepsis. Patient was transfer to [facility] 4/12 for TEE, but it was cancel due to severe anemia, Hb at 6. Patient Decided left AMA from [facility] Her Grandfather brought her back to [place].   Patient was found to have disseminated MSSA Bacteremia, mitral valve and tricuspid valve endocarditis, pulmonary septic emboli, splenic emboli, probable septic arthritis of the left knee.  ID following.    Assessment & Plan:   Active Problems:   Endocarditis   Hypokalemia   Septic arthritis ([place])   Splenic infarct   Bacteremia due to methicillin susceptible Staphylococcus aureus (MSSA)   IV drug abuse ([place])   Tobacco abuse   Positive hepatitis C antibody test  #1 disseminated MSSA bacteremia, POA Patient noted to have disseminated MSSA bacteremia with pulmonary septic emboli, splenic emboli, probable septic arthritis of the left knee, tricuspid and mitral valve endocarditis.  MRI of the brain was obtained incomplete study patient refused to complete the study however no significant abnormality detected.  2D echo done with thickening tricuspid valve 1.1 x 1.7 cm vegetation, tricuspid valve mild to moderate regurgitation, thickened mitral valve cannot rule out vegetation, abnormal mitral valve.  Blood cultures from 06/11/2019 were growing staph aureus.  Repeat blood cultures 06/13/2019 with cocci in clusters.  Repeat cultures obtained 13 2021 pending with no growth to date.  Continue IV nafcillin will likely need 6 weeks of IV antibiotics.  ID following and appreciate input and recommendations.  2.  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  Patient with MSSA bacteremia.  Continue IV nafcillin.  Per ID.  3.  Septic emboli/splenic infarct IV nafcillin.  4.  Left knee septic arthritis Status post I&D of the left knee.  Continue IV nafcillin.  Pain management.  Supportive care.  5.  Opiate abuse Patient uses IV heroin.  Patient states was at the methadone clinic and was getting methadone 35 mg daily and requesting her current dose be increased.  Patient currently on methadone 10 mg twice a day.  Pharmacy contacted methadone clinic and per pharmacist patient noted not to be in the system although patient adamant she goes there.  Patient asking for methadone dose to be increased back to 35 mg daily. Continue the clonidine detox protocol.  Continue current regimen of Percocet for pain as needed.    6.  Untreated/positive hepatitis C HCV RNA pending.  ID following.  7.  hypokalemia Potassium at 3.5 this morning.  K. Dur 40 mEq p.o. x1.  Magnesium currently at 2.0.  Follow.    8.  Tobacco abuse Tobacco cessation.  Continue nicotine patch.   9.  Thrombocytopenia Resolved.  10.  Iron deficiency anemia Status post 2 units packed red blood cells on 06/15/2019.  Hemoglobin currently stable at 9.6.  Will likely need oral iron supplementation on discharge.  May consider IV iron during this hospitalization however will discuss with ID on Monday.    DVT prophylaxis: Lovenox Code Status: Full Family  Communication: Updated patient.  No family at bedside.  Disposition:   Status is: Inpatient    Dispo: The patient is from: Home              Anticipated d/c is to: Likely home              Anticipated d/c date is: In approximately 5 weeks.              Patient currently on IV nafcillin and will need to complete 6 weeks of therapy prior to discharge as patient  with IV drug abuse.        Consultants:   Infectious disease: Dr. Graylon Good  Procedures:   MRI brain from 06/16/2019    Antimicrobials:   IV nafcillin 06/15/2019 (D8/42) >>>>>   Subjective: Patient laying in bed on the telephone.  Denies any chest pain or shortness of breath.  Complaining of hot flashes and some withdrawal symptoms asking for methadone to be increased back to prior dose of 35 mg that she was on supposedly at the methadone clinic.   Objective: Vitals:   06/18/19 0521 06/18/19 1254 06/18/19 2045 06/19/19 0526  BP: 120/83 122/79 111/66 119/69  Pulse: 96 (!) 106 (!) 108 (!) 105  Resp: 18 18 20 16   Temp: 98.3 F (36.8 C) 98.4 F (36.9 C) 99.8 F (37.7 C) 100.3 F (37.9 C)  TempSrc: Oral Oral Oral Oral  SpO2: 99% 100% 97% 98%  Weight:      Height:        Intake/Output Summary (Last 24 hours) at 06/19/2019 1335 Last data filed at 06/19/2019 1200 Gross per 24 hour  Intake 2635.89 ml  Output --  Net 2635.89 ml   Filed Weights   06/16/19 0110 06/16/19 0816  Weight: 49.9 kg 65.2 kg    Examination:  General exam: NAD Respiratory system: Scattered coarse breath sounds.  No crackles.  No wheezing.  Fair air movement.  Speaking in full sentences.  No use of accessory muscles of respiration.   Cardiovascular system: RRR no murmurs rubs or gallops.  No JVD.  No lower extremity edema. Gastrointestinal system: Abdomen is soft, obese, nontender, nondistended, positive bowel sounds.  No rebound.  No guarding. Central nervous system: Alert and oriented. No focal neurological deficits. Extremities: Left knee bandaged.  Skin: No rashes, lesions or ulcers Psychiatry: Judgement and insight appear normal. Mood & affect appropriate.     Data Reviewed: I have personally reviewed following labs and imaging studies  CBC: Recent Labs  Lab 06/15/19 0756 06/15/19 0756 06/15/19 1421 06/16/19 0500 06/17/19 1211 06/18/19 1032 06/19/19 1051  WBC 18.8*   < > 19.5*  19.5* 20.0* 17.3* 19.0*  NEUTROABS 14.4*  --  16.3*  --  15.5* 13.7* 15.4*  HGB 6.5*   < > 6.8* 11.7* 10.5* 10.6* 9.6*  HCT 18.6*   < > 20.9* 35.7* 30.8* 31.3* 29.6*  MCV 75.9*   < > 80.4 84.8 80.6 82.6 85.8  PLT 216   < > 232 268 293 308 331   < > = values in this interval not displayed.    Basic Metabolic Panel: Recent Labs  Lab 06/13/19 1153 06/14/19 0838 06/15/19 1421 06/15/19 1520 06/16/19 0500 06/17/19 1211 06/18/19 1032 06/19/19 1051  NA 141   < > 135  --  140 139 140 142  K 2.6*   < > 3.0*  --  3.9 3.0* 3.4* 3.5  CL 108   < > 102  --  108  108 109 108  CO2 24   < > 23  --  22 23 24 26   GLUCOSE 88   < > 153*  --  102* 91 111* 100*  BUN 17   < > 7  --  <5* <5* <5* 6  CREATININE 0.66   < > 0.53  --  0.51 0.45 0.46 0.46  CALCIUM 7.6*   < > 7.1*  --  7.5* 7.3* 7.4* 7.8*  MG 2.5*  --   --  2.0  --  1.7 2.0 2.0   < > = values in this interval not displayed.    GFR: Estimated Creatinine Clearance: 103.6 mL/min (by C-G formula based on SCr of 0.46 mg/dL).  Liver Function Tests: Recent Labs  Lab 06/16/19 0500 06/17/19 1211  AST 20 18  ALT 11 12  ALKPHOS 72 61  BILITOT 0.9 0.8  PROT 6.7 6.4*  ALBUMIN 1.8* 1.6*    CBG: No results for input(s): GLUCAP in the last 168 hours.   Recent Results (from the past 240 hour(s))  Culture, blood (Routine x 2)     Status: Abnormal   Collection Time: 06/11/19 12:05 AM   Specimen: BLOOD  Result Value Ref Range Status   Specimen Description   Final    BLOOD BLOOD LEFT HAND Performed at [facility] [address], [place], [place] [address]    Special Requests   Final    BOTTLES DRAWN AEROBIC ONLY Blood Culture adequate volume Performed at [facility] [address], [place], [place] [address]    Culture  Setup Time   Final    GRAM POSITIVE COCCI IN CLUSTERS AEROBIC BOTTLE ONLY CRITICAL VALUE NOTED.  VALUE IS CONSISTENT WITH PREVIOUSLY REPORTED AND CALLED VALUE.    Culture (A)   Final    STAPHYLOCOCCUS AUREUS SUSCEPTIBILITIES PERFORMED ON PREVIOUS CULTURE WITHIN THE LAST 5 DAYS. Performed at [facility], [place] [address], [place], [place] [address]    Report Status 06/13/2019 FINAL  Final  Urine culture     Status: Abnormal   Collection Time: 06/11/19 12:05 AM   Specimen: In/Out Cath Urine  Result Value Ref Range Status   Specimen Description   Final    IN/OUT CATH URINE Performed at [facility] [address], [place], [place] [address]    Special Requests   Final    NONE Performed at [facility], [place] [address] [place], [place] [address]    Culture (A)  Final    >=100,000 COLONIES/mL KOCURIA SPECIES 10,000 COLONIES/mL STAPHYLOCOCCUS AUREUS Standardized susceptibility testing for this organism is not available. FOR KOCURIA SPECIES Performed at [facility], [place] [address], [place], [place] [address]    Report Status 06/13/2019 FINAL  Final   Organism ID, Bacteria STAPHYLOCOCCUS AUREUS (A)  Final      Susceptibility   Staphylococcus aureus - MIC*    CIPROFLOXACIN >=8 RESISTANT Resistant     GENTAMICIN <=0.5 SENSITIVE Sensitive     NITROFURANTOIN <=16 SENSITIVE Sensitive     OXACILLIN 0.5 SENSITIVE Sensitive     TETRACYCLINE <=1 SENSITIVE Sensitive     VANCOMYCIN 1 SENSITIVE Sensitive     TRIMETH/SULFA <=10 SENSITIVE Sensitive     CLINDAMYCIN <=0.25 SENSITIVE Sensitive     RIFAMPIN <=0.5 SENSITIVE Sensitive     Inducible Clindamycin NEGATIVE Sensitive     * 10,000 COLONIES/mL STAPHYLOCOCCUS AUREUS  Culture, blood (Routine x 2)  Status: Abnormal   Collection Time: 06/11/19 12:10 AM   Specimen: BLOOD  Result Value Ref Range Status   Specimen Description   Final    BLOOD BLOOD RIGHT FOREARM Performed at [facility] [address], [place], [place] [address]    Special Requests   Final    BOTTLES DRAWN AEROBIC ONLY Blood Culture adequate volume Performed at [facility] [address] [place], [place] [address]    Culture  Setup Time   Final    GRAM POSITIVE COCCI IN CLUSTERS AEROBIC BOTTLE ONLY CRITICAL RESULT CALLED TO, READ BACK BY AND VERIFIED WITH: [place] [id number] FCP Performed at [facility], [place] [address] [place], [place] [address]    Culture STAPHYLOCOCCUS AUREUS (A)  Final   Report Status 06/13/2019 FINAL  Final   Organism ID, Bacteria STAPHYLOCOCCUS AUREUS  Final      Susceptibility   Staphylococcus aureus - MIC*    CIPROFLOXACIN >=8 RESISTANT Resistant     ERYTHROMYCIN >=8 RESISTANT Resistant     GENTAMICIN <=0.5 SENSITIVE Sensitive     OXACILLIN 0.5 SENSITIVE Sensitive     TETRACYCLINE <=1 SENSITIVE Sensitive     VANCOMYCIN 1 SENSITIVE Sensitive     TRIMETH/SULFA <=10 SENSITIVE Sensitive     CLINDAMYCIN <=0.25 SENSITIVE Sensitive     RIFAMPIN <=0.5 SENSITIVE Sensitive     Inducible Clindamycin NEGATIVE Sensitive     * STAPHYLOCOCCUS AUREUS  Blood Culture ID Panel (Reflexed)     Status: Abnormal   Collection Time: 06/11/19 12:10 AM  Result Value Ref Range Status   Enterococcus species NOT DETECTED NOT DETECTED Final   Listeria monocytogenes NOT DETECTED NOT DETECTED Final   Staphylococcus species DETECTED (A) NOT DETECTED Final    Comment: CRITICAL RESULT CALLED TO, READ BACK BY AND VERIFIED WITH: PHARMD MICHELLE B. [id number] FCP    Staphylococcus aureus (BCID) DETECTED (A) NOT DETECTED Final    Comment: Methicillin (oxacillin) susceptible Staphylococcus aureus (MSSA). Preferred therapy is anti staphylococcal beta lactam antibiotic (Cefazolin or Nafcillin), unless clinically contraindicated. CRITICAL RESULT CALLED TO, READ BACK BY AND VERIFIED WITH: PHARMD MICHELLE B. [id number] FCP    Methicillin resistance NOT DETECTED NOT DETECTED Final   Streptococcus species NOT DETECTED NOT DETECTED Final   Streptococcus agalactiae NOT DETECTED NOT DETECTED Final   Streptococcus pneumoniae NOT  DETECTED NOT DETECTED Final   Streptococcus pyogenes NOT DETECTED NOT DETECTED Final   Acinetobacter baumannii NOT DETECTED NOT DETECTED Final   Enterobacteriaceae species NOT DETECTED NOT DETECTED Final   Enterobacter cloacae complex NOT DETECTED NOT DETECTED Final   Escherichia coli NOT DETECTED NOT DETECTED Final   Klebsiella oxytoca NOT DETECTED NOT DETECTED Final   Klebsiella pneumoniae NOT DETECTED NOT DETECTED Final   Proteus species NOT DETECTED NOT DETECTED Final   Serratia marcescens NOT DETECTED NOT DETECTED Final   Haemophilus influenzae NOT DETECTED NOT DETECTED Final   Neisseria meningitidis NOT DETECTED NOT DETECTED Final   Pseudomonas aeruginosa NOT DETECTED NOT DETECTED Final   Candida albicans NOT DETECTED NOT DETECTED Final   Candida glabrata NOT DETECTED NOT DETECTED Final   Candida krusei NOT DETECTED NOT DETECTED Final   Candida parapsilosis NOT DETECTED NOT DETECTED Final   Candida tropicalis NOT DETECTED NOT DETECTED Final    Comment: Performed at [facility], [place] [address] [place], [place] [address]  SARS CORONAVIRUS 2 (TAT 6-24 HRS) Nasopharyngeal Nasopharyngeal Swab     Status:  None   Collection Time: 06/11/19 12:52 AM   Specimen: Nasopharyngeal Swab  Result Value Ref Range Status   SARS Coronavirus 2 NEGATIVE NEGATIVE Final    Comment: (NOTE) SARS-CoV-2 target nucleic acids are NOT DETECTED. The SARS-CoV-2 RNA is generally detectable in upper and lower respiratory specimens during the acute phase of infection. Negative results do not preclude SARS-CoV-2 infection, do not rule out co-infections with other pathogens, and should not be used as the sole basis for treatment or other patient management decisions. Negative results must be combined with clinical observations, patient history, and epidemiological information. The expected result is Negative. Fact Sheet for Patients: SugarRoll.be Fact Sheet for Healthcare  Providers: https://www.woods-mathews.com/ This test is not yet approved or cleared by the [place] FDA and  has been authorized for detection and/or diagnosis of SARS-CoV-2 by FDA under an Emergency Use Authorization (EUA). This EUA will remain  in effect (meaning this test can be used) for the duration of the COVID-19 declaration under Section 56 4(b)(1) of the Act, 21 U.S.C. section 360bbb-3(b)(1), unless the authorization is terminated or revoked sooner. Performed at [facility], [place] [address], [place], [place] [address]   Respiratory Panel by RT PCR (Flu A&B, Covid) - Nasopharyngeal Swab     Status: None   Collection Time: 06/11/19  5:25 AM   Specimen: Nasopharyngeal Swab  Result Value Ref Range Status   SARS Coronavirus 2 by RT PCR NEGATIVE NEGATIVE Final    Comment: (NOTE) SARS-CoV-2 target nucleic acids are NOT DETECTED. The SARS-CoV-2 RNA is generally detectable in upper respiratoy specimens during the acute phase of infection. The lowest concentration of SARS-CoV-2 viral copies this assay can detect is 131 copies/mL. A negative result does not preclude SARS-Cov-2 infection and should not be used as the sole basis for treatment or other patient management decisions. A negative result may occur with  improper specimen collection/handling, submission of specimen other than nasopharyngeal swab, presence of viral mutation(s) within the areas targeted by this assay, and inadequate number of viral copies (<131 copies/mL). A negative result must be combined with clinical observations, patient history, and epidemiological information. The expected result is Negative. Fact Sheet for Patients:  PinkCheek.be Fact Sheet for Healthcare Providers:  GravelBags.it This test is not yet ap proved or cleared by the [place] FDA and  has been authorized for detection and/or diagnosis of SARS-CoV-2 by FDA  under an Emergency Use Authorization (EUA). This EUA will remain  in effect (meaning this test can be used) for the duration of the COVID-19 declaration under Section 564(b)(1) of the Act, 21 U.S.C. section 360bbb-3(b)(1), unless the authorization is terminated or revoked sooner.    Influenza A by PCR NEGATIVE NEGATIVE Final   Influenza B by PCR NEGATIVE NEGATIVE Final    Comment: (NOTE) The Xpert Xpress SARS-CoV-2/FLU/RSV assay is intended as an aid in  the diagnosis of influenza from Nasopharyngeal swab specimens and  should not be used as a sole basis for treatment. Nasal washings and  aspirates are unacceptable for Xpert Xpress SARS-CoV-2/FLU/RSV  testing. Fact Sheet for Patients: PinkCheek.be Fact Sheet for Healthcare Providers: GravelBags.it This test is not yet approved or cleared by the [place] FDA and  has been authorized for detection and/or diagnosis of SARS-CoV-2 by  FDA under an Emergency Use Authorization (EUA). This EUA will remain  in effect (meaning this test can be used) for the duration of the  Covid-19 declaration under Section 564(b)(1) of the Act, 21  U.S.C.  section 360bbb-3(b)(1), unless the authorization is  terminated or revoked. Performed at [facility], [place] [address] [place], [place] [address]   Body fluid culture     Status: None   Collection Time: 06/11/19  2:03 PM   Specimen: Synovium; Synovial Fluid  Result Value Ref Range Status   Specimen Description SYNOVIAL FLUID KNEE LEFT  Final   Special Requests   Final    NONE Performed at [facility] [address], [place], [place] [address]    Gram Stain   Final    ABUNDANT WBC PRESENT,BOTH PMN AND MONONUCLEAR NO ORGANISMS SEEN Gram Stain Report Called to,Read Back By and Verified With: DR. Lyla Glassing AT 1512 ON 06/11/19 BY N.Braylie Badami Performed at [facility], [place] [address],  [place], [place] [address]    Culture   Final    NO GROWTH 3 DAYS Performed at [facility], [place] [address], [place], [place] [address]    Report Status 06/14/2019 FINAL  Final  Anaerobic culture     Status: None   Collection Time: 06/11/19  2:03 PM   Specimen: Synovium; Synovial Fluid  Result Value Ref Range Status   Specimen Description SYNOVIAL FLUID KNEE LEFT  Final   Special Requests   Final    NONE Performed at [facility] [address] [place], [place] [address]    Culture   Final    NO ANAEROBES ISOLATED Performed at [facility], [place] [address], [place], [place] [address]    Report Status 06/16/2019 FINAL  Final  Aerobic/Anaerobic Culture (surgical/deep wound)     Status: None   Collection Time: 06/11/19  6:30 PM   Specimen: Synovial, Left Knee; Body Fluid  Result Value Ref Range Status   Specimen Description   Final    SYNOVIAL KNEE LEFT Performed at [facility] [address] [place], [place] [address]    Special Requests   Final    NONE Performed at [facility], [place] [address] [place], [place] [address]    Gram Stain   Final    RARE WBC PRESENT, PREDOMINANTLY PMN NO ORGANISMS SEEN    Culture   Final    RARE STAPHYLOCOCCUS AUREUS NO ANAEROBES ISOLATED Performed at [facility], [place] [address] [place], [place] [address]    Report Status 06/16/2019 FINAL  Final   Organism ID, Bacteria STAPHYLOCOCCUS AUREUS  Final      Susceptibility   Staphylococcus aureus - MIC*    CIPROFLOXACIN >=8 RESISTANT Resistant     ERYTHROMYCIN >=8 RESISTANT Resistant     GENTAMICIN <=0.5 SENSITIVE Sensitive     OXACILLIN 0.5 SENSITIVE Sensitive     TETRACYCLINE <=1 SENSITIVE Sensitive     VANCOMYCIN <=0.5 SENSITIVE Sensitive     TRIMETH/SULFA <=10 SENSITIVE Sensitive     CLINDAMYCIN <=0.25 SENSITIVE Sensitive     RIFAMPIN <=0.5 SENSITIVE Sensitive     Inducible Clindamycin NEGATIVE Sensitive     *  RARE STAPHYLOCOCCUS AUREUS  Aerobic/Anaerobic Culture (surgical/deep wound)     Status: None   Collection Time: 06/11/19  6:32 PM   Specimen: Synovium; Tissue  Result Value Ref Range Status   Specimen Description   Final    SYNOVIAL TISSUE KNEE LEFT Performed at [facility] [address], [place], [place] [address]    Special Requests   Final    NONE Performed at [facility], [address] [place], [place] [address]    Gram Stain NO WBC SEEN NO ORGANISMS SEEN   Final   Culture   Final    RARE STAPHYLOCOCCUS AUREUS NO ANAEROBES ISOLATED Performed at [facility], [place] [address], [place], [place] [address]    Report Status 06/17/2019 FINAL  Final   Organism ID, Bacteria STAPHYLOCOCCUS AUREUS  Final      Susceptibility   Staphylococcus aureus - MIC*    CIPROFLOXACIN >=8 RESISTANT Resistant     ERYTHROMYCIN >=8 RESISTANT Resistant     GENTAMICIN <=0.5 SENSITIVE Sensitive     OXACILLIN 0.5 SENSITIVE Sensitive     TETRACYCLINE <=1 SENSITIVE Sensitive     VANCOMYCIN 1 SENSITIVE Sensitive     TRIMETH/SULFA <=10 SENSITIVE Sensitive     CLINDAMYCIN <=0.25 SENSITIVE Sensitive     RIFAMPIN <=0.5 SENSITIVE Sensitive     Inducible Clindamycin NEGATIVE Sensitive     * RARE STAPHYLOCOCCUS AUREUS  Surgical PCR screen     Status: Abnormal   Collection Time: 06/11/19  8:49 PM   Specimen: Nasal Mucosa; Nasal Swab  Result Value Ref Range Status   MRSA, PCR NEGATIVE NEGATIVE Final   Staphylococcus aureus POSITIVE (A) NEGATIVE Final    Comment: (NOTE) The Xpert SA Assay (FDA approved for NASAL specimens in patients 47 years of age and older), is one component of a comprehensive surveillance program. It is not intended to diagnose infection nor to guide or monitor treatment. Performed at [facility], [place] [address], [place], [place] [address]   Culture, blood (routine x 2)     Status: Abnormal   Collection Time: 06/13/19  11:53 AM   Specimen: BLOOD LEFT HAND  Result Value Ref Range Status   Specimen Description   Final    BLOOD LEFT HAND Performed at [facility] [address], [place], [place] [address]    Special Requests   Final    BOTTLES DRAWN AEROBIC AND ANAEROBIC Blood Culture adequate volume Performed at [facility] [address], [place], [place] [address]    Culture  Setup Time   Final    GRAM POSITIVE COCCI IN CLUSTERS AEROBIC BOTTLE ONLY CRITICAL RESULT CALLED TO, READ BACK BY AND VERIFIED WITH: PHARMD C SHADE [id number] AT 18 AM BY CM    Culture (A)  Final    STAPHYLOCOCCUS AUREUS SUSCEPTIBILITIES PERFORMED ON PREVIOUS CULTURE WITHIN THE LAST 5 DAYS. Performed at [facility], [place] [address], [place], [place] [address]    Report Status 06/15/2019 FINAL  Final  Culture, blood (routine x 2)     Status: Abnormal   Collection Time: 06/13/19 11:53 AM   Specimen: BLOOD RIGHT HAND  Result Value Ref Range Status   Specimen Description   Final    BLOOD RIGHT HAND Performed at [facility] [address] [place], [place] [address]    Special Requests   Final    BOTTLES DRAWN AEROBIC AND ANAEROBIC Blood Culture results may not be optimal due to an inadequate volume of blood received in culture bottles Performed at [facility] [address], [place], [place] [address]    Culture  Setup Time   Final    GRAM POSITIVE COCCI IN CLUSTERS AEROBIC BOTTLE ONLY CRITICAL RESULT CALLED TO, READ BACK BY AND VERIFIED WITH: PHARMD C SHADE [id number] AT 4 AM BY CM    Culture (A)  Final    STAPHYLOCOCCUS AUREUS SUSCEPTIBILITIES  PERFORMED ON PREVIOUS CULTURE WITHIN THE LAST 5 DAYS. Performed at [facility], [place] [address], [place], [place] [address]    Report Status 06/15/2019 FINAL  Final  Culture, blood (Routine X 2) w Reflex to ID Panel     Status: None (Preliminary result)   Collection Time: 06/16/19  4:33  PM   Specimen: BLOOD LEFT HAND  Result Value Ref Range Status   Specimen Description   Final    BLOOD LEFT HAND Performed at [facility] [address], [place], [place] [address]    Special Requests   Final    BOTTLES DRAWN AEROBIC ONLY Blood Culture adequate volume Performed at [facility] [place] [address] [place], [place] [address]    Culture   Final    NO GROWTH 2 DAYS Performed at [facility] [address] [place], [place] [address]    Report Status PENDING  Incomplete  Culture, blood (Routine X 2) w Reflex to ID Panel     Status: None (Preliminary result)   Collection Time: 06/16/19  4:40 PM   Specimen: BLOOD LEFT HAND  Result Value Ref Range Status   Specimen Description   Final    BLOOD LEFT HAND Performed at [facility] [address], [place], [place] [address]    Special Requests   Final    BOTTLES DRAWN AEROBIC ONLY Blood Culture results may not be optimal due to an inadequate volume of blood received in culture bottles Performed at [facility] [address], [place], [place] [address]    Culture   Final    NO GROWTH 2 DAYS Performed at [facility] [address] [place], [place] [address]    Report Status PENDING  Incomplete         Radiology Studies: [place] EKG SITE RITE  Result Date: 06/18/2019 If Site Rite image not attached, placement could not be confirmed due to current cardiac rhythm.       Scheduled Meds: . Chlorhexidine Gluconate Cloth  6 each Topical Daily  . enoxaparin (LOVENOX) injection  40 mg Subcutaneous Q24H  . methadone  10 mg Oral Q12H  . nicotine  21 mg Transdermal Daily  . potassium chloride  40 mEq Oral Once  . saccharomyces boulardii  250 mg Oral BID  . sodium chloride flush  10-40 mL Intracatheter Q12H   Continuous Infusions: . nafcillin (NAFCIL) continuous infusion 20.8 mL/hr at 06/19/19 1200     LOS: 4 days    Time  spent: 35 minutes    Irine Seal, MD [facility]   To contact the attending provider between 7A-7P or the covering provider during after hours 7P-7A, please log into the web site [email or web address] and access using universal [place] password for that web site. If you do not have the password, please call the hospital operator.  06/19/2019, 1:35 PM

## 2019-06-19 NOTE — TOC Progression Note (Addendum)
[facility] NOTE REDACTION - DIFFERENT PATIENT  Due to an Incorrect Registration or Identity Theft, we are redacting this note. In these particular instances; both names are NOT the same patient.  We have changed the demographic information to reflect those of the correct patient.  Please review for accuracy and ? Mark Complt the note.  Please do not refresh the body of the note.   Should the change in demographics impact clinical decisions that had been made at the time; You may choose to Edit Note from our Note Redaction.  This will create an Orlinda Onus Identity Application Analyst ([id number]) [phone] [email or web address]

## 2019-06-19 NOTE — Progress Notes (Signed)
ID PROGRESS NOTE  29yo F with disseminated MSSA disease (bacteremia, TV and MV endocarditis, with pulmonary and septic septic emboli) and left knee septic arthritis  - appears to tolerate nafcillin since picc line placement done yesterday -continue on nafcillin, ideally would need 6 wk, can consider what treatment options may exist after 4 wks of IV therapy  Will see back on Monday.  Elzie Rings [facility] [phone]

## 2019-06-20 LAB — CBC WITH DIFFERENTIAL/PLATELET
Abs Immature Granulocytes: 0.1 10*3/uL — ABNORMAL HIGH (ref 0.00–0.07)
Basophils Absolute: 0.1 10*3/uL (ref 0.0–0.1)
Basophils Relative: 0 %
Eosinophils Absolute: 0.1 10*3/uL (ref 0.0–0.5)
Eosinophils Relative: 1 %
HCT: 29.6 % — ABNORMAL LOW (ref 36.0–46.0)
Hemoglobin: 9.5 g/dL — ABNORMAL LOW (ref 12.0–15.0)
Immature Granulocytes: 1 %
Lymphocytes Relative: 14 %
Lymphs Abs: 2.4 10*3/uL (ref 0.7–4.0)
MCH: 27.7 pg (ref 26.0–34.0)
MCHC: 32.1 g/dL (ref 30.0–36.0)
MCV: 86.3 fL (ref 80.0–100.0)
Monocytes Absolute: 1.1 10*3/uL — ABNORMAL HIGH (ref 0.1–1.0)
Monocytes Relative: 7 %
Neutro Abs: 13.7 10*3/uL — ABNORMAL HIGH (ref 1.7–7.7)
Neutrophils Relative %: 77 %
Platelets: 401 10*3/uL — ABNORMAL HIGH (ref 150–400)
RBC: 3.43 MIL/uL — ABNORMAL LOW (ref 3.87–5.11)
RDW: 16.4 % — ABNORMAL HIGH (ref 11.5–15.5)
WBC: 17.5 10*3/uL — ABNORMAL HIGH (ref 4.0–10.5)
nRBC: 0 % (ref 0.0–0.2)

## 2019-06-20 LAB — BASIC METABOLIC PANEL
Anion gap: 4 — ABNORMAL LOW (ref 5–15)
BUN: 7 mg/dL (ref 6–20)
CO2: 28 mmol/L (ref 22–32)
Calcium: 7.7 mg/dL — ABNORMAL LOW (ref 8.9–10.3)
Chloride: 107 mmol/L (ref 98–111)
Creatinine, Ser: 0.46 mg/dL (ref 0.44–1.00)
GFR calc Af Amer: >60 mL/min (ref >60)
GFR calc non Af Amer: >60 mL/min (ref >60)
Glucose, Bld: 103 mg/dL — ABNORMAL HIGH (ref 70–99)
Potassium: 3.4 mmol/L — ABNORMAL LOW (ref 3.5–5.1)
Sodium: 139 mmol/L (ref 135–145)

## 2019-06-20 LAB — MAGNESIUM: Magnesium: 1.8 mg/dL (ref 1.7–2.4)

## 2019-06-20 MED ORDER — METHADONE HCL 5 MG PO TABS
35.0000 mg | ORAL_TABLET | Freq: Every day | ORAL | Status: DC
  Administered 2019-06-20 – 2019-06-26 (×7): 35 mg via ORAL
  Filled 2019-06-20 (×7): qty 1

## 2019-06-20 MED ORDER — MAGNESIUM SULFATE 2 GM/50ML IV SOLN
2.0000 g | Freq: Once | INTRAVENOUS | Status: AC
  Administered 2019-06-20: 2 g via INTRAVENOUS
  Filled 2019-06-20: qty 50

## 2019-06-20 MED ORDER — POTASSIUM CHLORIDE CRYS ER 20 MEQ PO TBCR
40.0000 meq | EXTENDED_RELEASE_TABLET | Freq: Once | ORAL | Status: AC
  Administered 2019-06-20: 40 meq via ORAL
  Filled 2019-06-20: qty 2

## 2019-06-20 MED ORDER — FUROSEMIDE 10 MG/ML IJ SOLN
40.0000 mg | Freq: Once | INTRAMUSCULAR | Status: AC
  Administered 2019-06-20: 40 mg via INTRAVENOUS
  Filled 2019-06-20: qty 4

## 2019-06-20 NOTE — Progress Notes (Incomplete Revision)
PROGRESS NOTE    Michelle Doyle  MRN:[id number] DOB: 07/18/1990 DOA: 06/15/2019 PCP: Patient, No Pcp Per    Chief Complaint  Patient presents with   Abnormal Lab    Brief Narrative: (Start on day 1 of progress note - keep it brief and live) 29 year old with PMH significant for IV heroin abuse, Untreated Hepatitis C, initially admitted for acute heroin withdrawal and sepsis. Patient was transfer to [facility] 4/12 for TEE, but it was cancel due to severe anemia, Hb at 6. Patient Decided left AMA from [facility] Her Grandfather brought her back to [facility].    Patient was found to have disseminated MSSA Bacteremia, mitral valve and tricuspid valve endocarditis, pulmonary septic emboli, splenic emboli, probable septic arthritis of the left knee.  ID following.    Assessment & Plan:   Active Problems:   Endocarditis   Hypokalemia   Septic arthritis (HCC)   Splenic infarct   Bacteremia due to methicillin susceptible Staphylococcus aureus (MSSA)   IV drug abuse (HCC)   Tobacco abuse   Positive hepatitis C antibody test  1 disseminated MSSA bacteremia, POA Patient noted to have disseminated MSSA bacteremia with pulmonary septic emboli, splenic emboli, probable septic arthritis of the left knee, tricuspid and mitral valve endocarditis.  MRI of the brain was obtained incomplete study patient refused to complete the study however no significant abnormality detected.  2D echo done with thickening tricuspid valve 1.1 x 1.7 cm vegetation, tricuspid valve mild to moderate regurgitation, thickened mitral valve cannot rule out vegetation, abnormal mitral valve.  Blood cultures from 06/11/2019 were growing staph aureus.  Repeat blood cultures 06/13/2019 with cocci in clusters.  Repeat cultures obtained 06/16/2019 with no growth to date.  Continue IV nafcillin will likely need total of 6 weeks of IV antibiotics.  ID following and appreciate input and recommendations.  2.  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  Patient with MSSA bacteremia.  Continue IV nafcillin.  Per ID.  3.  Septic emboli/splenic infarct Continue IV nafcillin.  4.  Left knee septic arthritis Status post I&D of the left knee.  Continue IV nafcillin.  Pain management.  Will ask orthopedics to reassess knee tomorrow.  Supportive care.  5.  Opiate abuse Patient uses IV heroin.  Patient states was at the methadone clinic and was getting methadone 35 mg daily and requesting her current dose be increased.  Patient currently on methadone 10 mg twice a day.  Pharmacy contacted methadone clinic and per pharmacist patient noted not to be in the system although patient adamant she goes there.  Patient asking for methadone dose to be increased back to 35 mg daily.  Increase methadone to 35 mg daily as patient with complaints of hot flashes, chills.  Continue the clonidine detox protocol. Continue current regimen of Percocet for pain as needed.    6.  Untreated/positive hepatitis C HCV RNA pending.  ID following.  7.  hypokalemia Potassium at 3.4 this morning.  K. Dur 40 mEq p.o. x1.  Magnesium currently at 1.8.  Follow.    8.  Tobacco abuse Nicotine patch.    9.  Thrombocytopenia Resolved.  10.  Iron deficiency anemia Status post 2 units packed red blood cells on 06/15/2019.  Hemoglobin currently stable at 9.5.  Will likely need oral iron supplementation on discharge.  May consider   IV iron during this hospitalization however will discuss with ID on Monday.    DVT prophylaxis: Lovenox Code Status: Full Family Communication: Updated patient.  No family at bedside.  Disposition:   Status is: Inpatient    Dispo: The patient is from: Home              Anticipated d/c is to: Likely home              Anticipated d/c date is: In approximately 5 weeks.               Patient currently on IV nafcillin and will need to complete 6 weeks of therapy prior to discharge as patient with IV drug abuse.        Consultants:  Infectious disease: Dr. Snider  Procedures:  MRI brain from 06/16/2019 PICC line  Antimicrobials:  IV nafcillin 06/15/2019 (D8/42) >>>>>   Subjective: Patient in bed.  States she had a fever overnight and noted to have a T-max of 101.6.  Still with some chills.  Denies any nausea or vomiting.  Tolerating oral intake.  Denies any significant shortness of breath.  Denies any chest pain.  Appreciate above methadone dose being increased to 35 mg this morning.    Objective: Vitals:   06/19/19 1544 06/19/19 2030 06/20/19 0435 06/20/19 0512  BP: 112/74 128/81 (!) 88/68 113/69  Pulse: 98 (!) 110 (!) 113 (!) 108  Resp: 20 18 18   Temp: 98.8 F (37.1 C) 99.9 F (37.7 C) (!) 101.6 F (38.7 C) (!) 100.7 F (38.2 C)  TempSrc: Oral Oral Oral Oral  SpO2: 100% 96% 97% 96%  Weight:      Height:        Intake/Output Summary (Last 24 hours) at 06/20/2019 1304 Last data filed at 06/19/2019 1400 Gross per 24 hour  Intake 240 ml  Output --  Net 240 ml   Filed Weights   06/16/19 0110 06/16/19 0816  Weight: 49.9 kg 65.2 kg    Examination:  General exam: NAD Respiratory system: Some scattered coarse breath sounds/crackles fair air movement.   Cardiovascular system: Regular rate rhythm no murmurs rubs or gallops.  No JVD.  No lower extremity edema. Gastrointestinal system: Abdomen is nontender, nondistended, soft, positive bowel sounds.  No rebound.  No guarding.  Central nervous system: Alert and oriented. No focal neurological deficits. Extremities: Left knee bandaged.  Skin: No rashes, lesions or ulcers Psychiatry: Judgement and insight appear normal. Mood & affect appropriate.     Data Reviewed: I have personally reviewed following labs and imaging studies  CBC: Recent Labs  Lab 06/15/19 1421 06/15/19 1421 06/16/19 0500  06/17/19 1211 06/18/19 1032 06/19/19 1051 06/20/19 1107  WBC 19.5*   < > 19.5* 20.0* 17.3* 19.0* 17.5*  NEUTROABS 16.3*  --   --  15.5* 13.7* 15.4* 13.7*  HGB 6.8*   < > 11.7* 10.5* 10.6* 9.6* 9.5*  HCT 20.9*   < > 35.7* 30.8* 31.3* 29.6* 29.6*  MCV 80.4   < > 84.8 80.6 82.6 85.8 86.3  PLT 232   < > 268 293 308 331 401*   < > = values in this interval not displayed.    Basic Metabolic Panel: Recent Labs  Lab 06/15/19 1421 06/15/19 1520 06/16/19 0500 06/17/19 1211 06/18/19 1032 06/19/19 1051 06/20/19 1107  NA   < >  --  140 139 140 142 139  K   < >  --  3.9 3.0* 3.4* 3.5 3.4*    CL   < >  --  108 108 109 108 107  CO2   < >  --  22 23 24 26 28  GLUCOSE   < >  --  102* 91 111* 100* 103*  BUN   < >  --  <5* <5* <5* 6 7  CREATININE   < >  --  0.51 0.45 0.46 0.46 0.46  CALCIUM   < >  --  7.5* 7.3* 7.4* 7.8* 7.7*  MG  --  2.0  --  1.7 2.0 2.0 1.8   < > = values in this interval not displayed.    GFR: Estimated Creatinine Clearance: 103.6 mL/min (by C-G formula based on SCr of 0.46 mg/dL).  Liver Function Tests: Recent Labs  Lab 06/16/19 0500 06/17/19 1211  AST 20 18  ALT 11 12  ALKPHOS 72 61  BILITOT 0.9 0.8  PROT 6.7 6.4*  ALBUMIN 1.8* 1.6*    CBG: No results for input(s): GLUCAP in the last 168 hours.   Recent Results (from the past 240 hour(s))  Culture, blood (Routine x 2)     Status: Abnormal   Collection Time: 06/11/19 12:05 AM   Specimen: BLOOD  Result Value Ref Range Status   Specimen Description   Final    BLOOD BLOOD LEFT HAND Performed at [facility], [address] [place], [place] [address]    Special Requests   Final    BOTTLES DRAWN AEROBIC ONLY Blood Culture adequate volume Performed at [facility], [address] [place], [place] [address]    Culture  Setup Time   Final    GRAM POSITIVE COCCI IN CLUSTERS AEROBIC BOTTLE ONLY CRITICAL VALUE NOTED.  VALUE IS CONSISTENT WITH PREVIOUSLY REPORTED AND  CALLED VALUE.    Culture (A)  Final    STAPHYLOCOCCUS AUREUS SUSCEPTIBILITIES PERFORMED ON PREVIOUS CULTURE WITHIN THE LAST 5 DAYS. Performed at [facility], [address] [place], [place] [address]    Report Status 06/13/2019 FINAL  Final  Urine culture     Status: Abnormal   Collection Time: 06/11/19 12:05 AM   Specimen: In/Out Cath Urine  Result Value Ref Range Status   Specimen Description   Final    IN/OUT CATH URINE Performed at [facility], [address] [place], [place] [address]    Special Requests   Final    NONE Performed at [facility], [address] [place], [place] [address]    Culture (A)  Final    >=100,000 COLONIES/mL KOCURIA SPECIES 10,000 COLONIES/mL STAPHYLOCOCCUS AUREUS Standardized susceptibility testing for this organism is not available. FOR KOCURIA SPECIES Performed at [facility], [address] , [place] [address]    Report Status 06/13/2019 FINAL  Final   Organism ID, Bacteria STAPHYLOCOCCUS AUREUS (A)  Final      Susceptibility   Staphylococcus aureus - MIC*    CIPROFLOXACIN >=8 RESISTANT Resistant     GENTAMICIN <=0.5 SENSITIVE Sensitive     NITROFURANTOIN <=16 SENSITIVE Sensitive     OXACILLIN 0.5 SENSITIVE Sensitive     TETRACYCLINE <=1 SENSITIVE Sensitive     VANCOMYCIN 1 SENSITIVE Sensitive     TRIMETH/SULFA <=10 SENSITIVE Sensitive     CLINDAMYCIN <=0.25 SENSITIVE Sensitive     RIFAMPIN <=0.5 SENSITIVE Sensitive     Inducible Clindamycin NEGATIVE Sensitive     * 10,000 COLONIES/mL STAPHYLOCOCCUS AUREUS  Culture, blood (Routine x 2)       Status: Abnormal   Collection Time: 06/11/19 12:10 AM   Specimen: BLOOD  Result Value Ref Range Status   Specimen Description   Final    BLOOD BLOOD RIGHT FOREARM Performed at [facility], [address] [place], [place] [address]    Special Requests   Final    BOTTLES DRAWN AEROBIC ONLY Blood Culture adequate  volume [facility] at  [facility], [address] [place], [place] [address]    Culture  Setup Time   Final    GRAM POSITIVE COCCI IN CLUSTERS AEROBIC BOTTLE ONLY CRITICAL RESULT CALLED TO, READ BACK BY AND VERIFIED WITH: PHARMD MICHELLE B. [id number] FCP Performed at [facility], [address] [place], [place] [address]    Culture STAPHYLOCOCCUS AUREUS (A)  Final   Report Status 06/13/2019 FINAL  Final   Organism ID, Bacteria STAPHYLOCOCCUS AUREUS  Final      Susceptibility   Staphylococcus aureus - MIC*    CIPROFLOXACIN >=8 RESISTANT Resistant     ERYTHROMYCIN >=8 RESISTANT Resistant     GENTAMICIN <=0.5 SENSITIVE Sensitive     OXACILLIN 0.5 SENSITIVE Sensitive     TETRACYCLINE <=1 SENSITIVE Sensitive     VANCOMYCIN 1 SENSITIVE Sensitive     TRIMETH/SULFA <=10 SENSITIVE Sensitive     CLINDAMYCIN <=0.25 SENSITIVE Sensitive     RIFAMPIN <=0.5 SENSITIVE Sensitive     Inducible Clindamycin NEGATIVE Sensitive     * STAPHYLOCOCCUS AUREUS  Blood Culture ID Panel (Reflexed)     Status: Abnormal   Collection Time: 06/11/19 12:10 AM  Result Value Ref Range Status   Enterococcus species NOT DETECTED NOT DETECTED Final   Listeria monocytogenes NOT DETECTED NOT DETECTED Final   Staphylococcus species DETECTED (A) NOT DETECTED Final    Comment: CRITICAL RESULT CALLED TO, READ BACK BY AND VERIFIED WITH: PHARMD MICHELLE B. [id number] FCP    Staphylococcus aureus (BCID) DETECTED (A) NOT DETECTED Final    Comment: Methicillin (oxacillin) susceptible Staphylococcus aureus (MSSA). Preferred therapy is anti staphylococcal beta lactam antibiotic (Cefazolin or Nafcillin), unless clinically contraindicated. CRITICAL RESULT CALLED TO, READ BACK BY AND VERIFIED WITH: PHARMD MICHELLE B. [id number] FCP    Methicillin resistance NOT DETECTED NOT DETECTED Final   Streptococcus species NOT DETECTED NOT DETECTED Final   Streptococcus agalactiae NOT DETECTED NOT DETECTED  Final   Streptococcus pneumoniae NOT DETECTED NOT DETECTED Final   Streptococcus pyogenes NOT DETECTED NOT DETECTED Final   Acinetobacter baumannii NOT DETECTED NOT DETECTED Final   Enterobacteriaceae species NOT DETECTED NOT DETECTED Final   Enterobacter cloacae complex NOT DETECTED NOT DETECTED Final   Escherichia coli NOT DETECTED NOT DETECTED Final   Klebsiella oxytoca NOT DETECTED NOT DETECTED Final   Klebsiella pneumoniae NOT DETECTED NOT DETECTED Final   Proteus species NOT DETECTED NOT DETECTED Final   Serratia marcescens NOT DETECTED NOT DETECTED Final   Haemophilus influenzae NOT DETECTED NOT DETECTED Final   Neisseria meningitidis NOT DETECTED NOT DETECTED Final   Pseudomonas aeruginosa NOT DETECTED NOT DETECTED Final   Candida albicans NOT DETECTED NOT DETECTED Final   Candida glabrata NOT DETECTED NOT DETECTED Final   Candida krusei NOT DETECTED NOT DETECTED Final   Candida parapsilosis NOT DETECTED NOT DETECTED Final   Candida tropicalis NOT DETECTED NOT DETECTED Final    Comment: Performed at [facility], [address] [place], [place] [address]  SARS CORONAVIRUS 2 (TAT 6-24 HRS) Nasopharyngeal Nasopharyngeal Swab     Status:   None   Collection Time: 06/11/19 12:52 AM   Specimen: Nasopharyngeal Swab  Result Value Ref Range Status   SARS Coronavirus 2 NEGATIVE NEGATIVE Final    Comment: (NOTE) SARS-CoV-2 target nucleic acids are NOT DETECTED. The SARS-CoV-2 RNA is generally detectable in upper and lower respiratory specimens during the acute phase of infection. Negative results do not preclude SARS-CoV-2 infection, do not rule out co-infections with other pathogens, and should not be used as the sole basis for treatment or other patient management decisions. Negative results must be combined with clinical observations, patient history, and epidemiological information. The expected result is Negative. Fact Sheet for  Patients: https://www.fda.gov/media/138098/download Fact Sheet for Healthcare Providers: https://www.fda.gov/media/138095/download This test is not yet approved or cleared by the [place] FDA and  has been authorized for detection and/or diagnosis of SARS-CoV-2 by FDA under an Emergency Use Authorization (EUA). This EUA will remain  in effect (meaning this test can be used) for the duration of the COVID-19 declaration under Section 56 4(b)(1) of the Act, 21 U.S.C. section 360bbb-3(b)(1), unless the authorization is terminated or revoked sooner. Performed at [facility], [address] [place], [place] [address]   Respiratory Panel by RT PCR (Flu A&B, Covid) - Nasopharyngeal Swab     Status: None   Collection Time: 06/11/19  5:25 AM   Specimen: Nasopharyngeal Swab  Result Value Ref Range Status   SARS Coronavirus 2 by RT PCR NEGATIVE NEGATIVE Final    Comment: (NOTE) SARS-CoV-2 target nucleic acids are NOT DETECTED. The SARS-CoV-2 RNA is generally detectable in upper respiratoy specimens during the acute phase of infection. The lowest concentration of SARS-CoV-2 viral copies this assay can detect is 131 copies/mL. A negative result does not preclude SARS-Cov-2 infection and should not be used as the sole basis for treatment or other patient management decisions. A negative result may occur with  improper specimen collection/handling, submission of specimen other than nasopharyngeal swab, presence of viral mutation(s) within the areas targeted by this assay, and inadequate number of viral copies (<131 copies/mL). A negative result must be combined with clinical observations, patient history, and epidemiological information. The expected result is Negative. Fact Sheet for Patients:  https://www.fda.gov/media/142436/download Fact Sheet for Healthcare Providers:  https://www.fda.gov/media/142435/download This test is not yet ap proved or cleared by the [place] FDA  and  has been authorized for detection and/or diagnosis of SARS-CoV-2 by FDA under an Emergency Use Authorization (EUA). This EUA will remain  in effect (meaning this test can be used) for the duration of the COVID-19 declaration under Section 564(b)(1) of the Act, 21 U.S.C. section 360bbb-3(b)(1), unless the authorization is terminated or revoked sooner.    Influenza A by PCR NEGATIVE NEGATIVE Final   Influenza B by PCR NEGATIVE NEGATIVE Final    Comment: (NOTE) The Xpert Xpress SARS-CoV-2/FLU/RSV assay is intended as an aid in  the diagnosis of influenza from Nasopharyngeal swab specimens and  should not be used as a sole basis for treatment. Nasal washings and  aspirates are unacceptable for Xpert Xpress SARS-CoV-2/FLU/RSV  testing. Fact Sheet for Patients: https://www.fda.gov/media/142436/download Fact Sheet for Healthcare Providers: https://www.fda.gov/media/142435/download This test is not yet approved or cleared by the [place] FDA and  has been authorized for detection and/or diagnosis of SARS-CoV-2 by  FDA under an Emergency Use Authorization (EUA). This EUA will remain  in effect (meaning this test can be used) for the duration of the  Covid-19 declaration under Section 564(b)(1) of the Act, 21  U.S.C.   section 360bbb-3(b)(1), unless the authorization is  terminated or revoked. Performed at [facility], [address] [place], [place] [address]   Body fluid culture     Status: None   Collection Time: 06/11/19  2:03 PM   Specimen: Synovium; Synovial Fluid  Result Value Ref Range Status   Specimen Description SYNOVIAL FLUID KNEE LEFT  Final   Special Requests   Final    NONE Performed at [facility], [address] [place], [place] [address]    Gram Stain   Final    ABUNDANT WBC PRESENT,BOTH PMN AND MONONUCLEAR NO ORGANISMS SEEN Gram Stain Report Called to,Read Back By and Verified With: DR. SWINTECK AT 1512 ON 06/11/19 BY  N.Nevah Dalal Performed at [facility], [address] [place], [place] [address]    Culture   Final    NO GROWTH 3 DAYS Performed at [facility], [address] [place], [place] [address]    Report Status 06/14/2019 FINAL  Final  Anaerobic culture     Status: None   Collection Time: 06/11/19  2:03 PM   Specimen: Synovium; Synovial Fluid  Result Value Ref Range Status   Specimen Description SYNOVIAL FLUID KNEE LEFT  Final   Special Requests   Final    NONE Performed at [facility], [address] [place], [place] [address]    Culture   Final    NO ANAEROBES ISOLATED Performed at [facility], [address] [place], [place] [address]    Report Status 06/16/2019 FINAL  Final  Aerobic/Anaerobic Culture (surgical/deep wound)     Status: None   Collection Time: 06/11/19  6:30 PM   Specimen: Synovial, Left Knee; Body Fluid  Result Value Ref Range Status   Specimen Description   Final    SYNOVIAL KNEE LEFT Performed at [facility], [address] [place], [place] [address]    Special Requests   Final    NONE Performed at [facility], [address] [place], [place] [address]    Gram Stain   Final    RARE WBC PRESENT, PREDOMINANTLY PMN NO ORGANISMS SEEN    Culture   Final    RARE STAPHYLOCOCCUS AUREUS NO ANAEROBES ISOLATED Performed at [facility], [address] [place], [place] [address]    Report Status 06/16/2019 FINAL  Final   Organism ID, Bacteria STAPHYLOCOCCUS AUREUS  Final      Susceptibility   Staphylococcus aureus - MIC*    CIPROFLOXACIN >=8 RESISTANT Resistant     ERYTHROMYCIN >=8 RESISTANT Resistant     GENTAMICIN <=0.5 SENSITIVE Sensitive     OXACILLIN 0.5 SENSITIVE Sensitive     TETRACYCLINE <=1 SENSITIVE Sensitive     VANCOMYCIN <=0.5 SENSITIVE Sensitive     TRIMETH/SULFA <=10 SENSITIVE Sensitive     CLINDAMYCIN <=0.25 SENSITIVE Sensitive     RIFAMPIN  <=0.5 SENSITIVE Sensitive     Inducible Clindamycin NEGATIVE Sensitive     * RARE STAPHYLOCOCCUS AUREUS  Aerobic/Anaerobic Culture (surgical/deep wound)     Status: None   Collection Time: 06/11/19  6:32 PM   Specimen: Synovium; Tissue  Result Value Ref Range Status   Specimen Description   Final    SYNOVIAL TISSUE KNEE LEFT Performed at [facility], [address] , [place] [address]    Special Requests   Final    NONE Performed at  [facility], [address] [place], [place] [address]    Gram Stain NO WBC SEEN NO ORGANISMS SEEN   Final   Culture   Final    RARE STAPHYLOCOCCUS AUREUS NO ANAEROBES ISOLATED Performed at [facility], [address] [place], [place] [address]    Report Status 06/17/2019 FINAL  Final   Organism ID, Bacteria STAPHYLOCOCCUS AUREUS  Final      Susceptibility   Staphylococcus aureus - MIC*    CIPROFLOXACIN >=8 RESISTANT Resistant     ERYTHROMYCIN >=8 RESISTANT Resistant     GENTAMICIN <=0.5 SENSITIVE Sensitive     OXACILLIN 0.5 SENSITIVE Sensitive     TETRACYCLINE <=1 SENSITIVE Sensitive     VANCOMYCIN 1 SENSITIVE Sensitive     TRIMETH/SULFA <=10 SENSITIVE Sensitive     CLINDAMYCIN <=0.25 SENSITIVE Sensitive     RIFAMPIN <=0.5 SENSITIVE Sensitive     Inducible Clindamycin NEGATIVE Sensitive     * RARE STAPHYLOCOCCUS AUREUS  Surgical PCR screen     Status: Abnormal   Collection Time: 06/11/19  8:49 PM   Specimen: Nasal Mucosa; Nasal Swab  Result Value Ref Range Status   MRSA, PCR NEGATIVE NEGATIVE Final   Staphylococcus aureus POSITIVE (A) NEGATIVE Final    Comment: (NOTE) The Xpert SA Assay (FDA approved for NASAL specimens in patients 22 years of age and older), is one component of a comprehensive surveillance program. It is not intended to diagnose infection nor to guide or monitor treatment. Performed at [facility], [address] [place], [place] [address]    Culture, blood (routine x 2)     Status: Abnormal   Collection Time: 06/13/19 11:53 AM   Specimen: BLOOD LEFT HAND  Result Value Ref Range Status   Specimen Description   Final    BLOOD LEFT HAND Performed at [facility], [address] [place], [place] [address]    Special Requests   Final    BOTTLES DRAWN AEROBIC AND ANAEROBIC Blood Culture adequate volume Performed at [facility], [address] [place], [place] [address]    Culture  Setup Time   Final    GRAM POSITIVE COCCI IN CLUSTERS AEROBIC BOTTLE ONLY CRITICAL RESULT CALLED TO, READ BACK BY AND VERIFIED WITH: PHARMD C SHADE [id number] AT 954 AM BY CM    Culture (A)  Final    STAPHYLOCOCCUS AUREUS SUSCEPTIBILITIES PERFORMED ON PREVIOUS CULTURE WITHIN THE LAST 5 DAYS. Performed at [facility], [address] [place], [place] [address]    Report Status 06/15/2019 FINAL  Final  Culture, blood (routine x 2)     Status: Abnormal   Collection Time: 06/13/19 11:53 AM   Specimen: BLOOD RIGHT HAND  Result Value Ref Range Status   Specimen Description   Final    BLOOD RIGHT HAND Performed at [facility], [address] [place], [place] [address]    Special Requests   Final    BOTTLES DRAWN AEROBIC AND ANAEROBIC Blood Culture results may not be optimal due to an inadequate volume of blood received in culture bottles Performed at [facility], [address] [place], [place] [address]    Culture  Setup Time   Final    GRAM POSITIVE COCCI IN CLUSTERS AEROBIC BOTTLE ONLY CRITICAL RESULT CALLED TO, READ BACK BY AND VERIFIED WITH: PHARMD C SHADE [id number] AT 954 AM BY CM    Culture (A)  Final    STAPHYLOCOCCUS AUREUS SUSCEPTIBILITIES   PERFORMED ON PREVIOUS CULTURE WITHIN THE LAST 5 DAYS. Performed at [facility], [address] [place], [place] [address]    Report Status 06/15/2019 FINAL  Final  Culture, blood (Routine X 2) w Reflex to ID  Panel     Status: None (Preliminary result)   Collection Time: 06/16/19  4:33 PM   Specimen: BLOOD LEFT HAND  Result Value Ref Range Status   Specimen Description   Final    BLOOD LEFT HAND Performed at [facility], [address] [place], [place] [address]    Special Requests   Final    BOTTLES DRAWN AEROBIC ONLY Blood Culture adequate volume Performed at [facility], [address] [place], [place] [address]    Culture   Final    NO GROWTH 4 DAYS Performed at [facility], [address] [place], [place] [address]    Report Status PENDING  Incomplete  Culture, blood (Routine X 2) w Reflex to ID Panel     Status: None (Preliminary result)   Collection Time: 06/16/19  4:40 PM   Specimen: BLOOD LEFT HAND  Result Value Ref Range Status   Specimen Description   Final    BLOOD LEFT HAND Performed at [facility], [address] [place], [place] [address]    Special Requests   Final    BOTTLES DRAWN AEROBIC ONLY Blood Culture results may not be optimal due to an inadequate volume of blood received in culture bottles Performed at [facility], [address] [place], [place] [address]    Culture   Final    NO GROWTH 4 DAYS Performed at [facility], [address] [place],  [address]    Report Status PENDING  Incomplete         Radiology Studies: US EKG SITE RITE  Result Date: 06/18/2019 If Site Rite image not attached, placement could not be confirmed due to current cardiac rhythm.       Scheduled Meds:  Chlorhexidine Gluconate Cloth  6 each Topical Daily   enoxaparin (LOVENOX) injection  40 mg Subcutaneous Q24H   furosemide  40 mg Intravenous Once   methadone  35 mg Oral Daily   nicotine  21 mg Transdermal Daily   potassium chloride  40 mEq Oral Once   saccharomyces boulardii  250 mg Oral BID   sodium chloride flush  10-40 mL Intracatheter Q12H   Continuous  Infusions:  magnesium sulfate bolus IVPB     nafcillin (NAFCIL) continuous infusion 12 g (06/19/19 1633)     LOS: 5 days    Time spent: 35 minutes    Tieara Flitton, MD [facility]   To contact the attending provider between 7A-7P or the covering provider during after hours 7P-7A, please log into the web site [email or web address] and access using universal [place] password for that web site. If you do not have the password, please call the hospital operator.  06/20/2019, 1:04 PM   

## 2019-06-20 NOTE — Progress Notes (Addendum)
 [facility] NOTE REDACTION - CHART CORRECTION -TIME SENSITIVE   Due to an Incorrect Registration or Identity Theft, we are redacting this note.   We have changed the demographic information to reflect those of the correct patient.  Please review for accuracy and ? Sign the note.  Please do not refresh the body of the note.   Should the change in demographics impact clinical decisions that had been made at the time, you can Edit Note before signing. This will create an Addendum.

## 2019-06-21 LAB — CULTURE, BLOOD (ROUTINE X 2)
Culture: NO GROWTH
Culture: NO GROWTH
Special Requests: ADEQUATE

## 2019-06-21 LAB — BASIC METABOLIC PANEL
Anion gap: 7 (ref 5–15)
BUN: 9 mg/dL (ref 6–20)
CO2: 27 mmol/L (ref 22–32)
Calcium: 7.9 mg/dL — ABNORMAL LOW (ref 8.9–10.3)
Chloride: 103 mmol/L (ref 98–111)
Creatinine, Ser: 0.57 mg/dL (ref 0.44–1.00)
GFR calc Af Amer: >60 mL/min (ref >60)
GFR calc non Af Amer: >60 mL/min (ref >60)
Glucose, Bld: 121 mg/dL — ABNORMAL HIGH (ref 70–99)
Potassium: 3.9 mmol/L (ref 3.5–5.1)
Sodium: 137 mmol/L (ref 135–145)

## 2019-06-21 LAB — CBC
HCT: 30.2 % — ABNORMAL LOW (ref 36.0–46.0)
Hemoglobin: 9.9 g/dL — ABNORMAL LOW (ref 12.0–15.0)
MCH: 28.2 pg (ref 26.0–34.0)
MCHC: 32.8 g/dL (ref 30.0–36.0)
MCV: 86 fL (ref 80.0–100.0)
Platelets: 466 10*3/uL — ABNORMAL HIGH (ref 150–400)
RBC: 3.51 MIL/uL — ABNORMAL LOW (ref 3.87–5.11)
RDW: 16.6 % — ABNORMAL HIGH (ref 11.5–15.5)
WBC: 17.6 10*3/uL — ABNORMAL HIGH (ref 4.0–10.5)
nRBC: 0 % (ref 0.0–0.2)

## 2019-06-21 LAB — URINALYSIS, ROUTINE W REFLEX MICROSCOPIC
Bilirubin Urine: NEGATIVE
Glucose, UA: NEGATIVE mg/dL
Hgb urine dipstick: NEGATIVE
Ketones, ur: NEGATIVE mg/dL
Leukocytes,Ua: NEGATIVE
Nitrite: NEGATIVE
Protein, ur: NEGATIVE mg/dL
Specific Gravity, Urine: 1.014 (ref 1.005–1.030)
pH: 7 (ref 5.0–8.0)

## 2019-06-21 LAB — MAGNESIUM: Magnesium: 2 mg/dL (ref 1.7–2.4)

## 2019-06-21 MED ORDER — FUROSEMIDE 10 MG/ML IJ SOLN
40.0000 mg | Freq: Once | INTRAMUSCULAR | Status: AC
  Administered 2019-06-21: 40 mg via INTRAVENOUS
  Filled 2019-06-21: qty 4

## 2019-06-21 NOTE — Progress Notes (Addendum)
PROGRESS NOTE    Michelle Doyle  [id number] DOB: 06-25-1990 DOA: 06/15/2019 PCP: Patient, No Pcp Per    Chief Complaint  Patient presents with   Abnormal Lab    Brief Narrative: (Start on day 1 of progress note - keep it brief and live) 29 year old with PMH significant for IV heroin abuse, Untreated Hepatitis C, initially admitted for acute heroin withdrawal and sepsis. Patient was transfer to [facility] 4/12 for TEE, but it was cancel due to severe anemia, Hb at 6. Patient Decided left AMA from [facility] Her Grandfather brought her back to [place].    Patient was found to have disseminated MSSA Bacteremia, mitral valve and tricuspid valve endocarditis, pulmonary septic emboli, splenic emboli, probable septic arthritis of the left knee.  ID following.    Assessment & Plan:   Active Problems:   Endocarditis   Hypokalemia   Septic arthritis ([place])   Splenic infarct   Bacteremia due to methicillin susceptible Staphylococcus aureus (MSSA)   IV drug abuse ([place])   Tobacco abuse   Positive hepatitis C antibody test  1 disseminated MSSA bacteremia, POA Patient noted to have disseminated MSSA bacteremia with pulmonary septic emboli, splenic emboli, probable septic arthritis of the left knee, tricuspid and mitral valve endocarditis.  MRI of the brain was obtained incomplete study patient refused to complete the study however no significant abnormality detected.  2D echo done with thickening tricuspid valve 1.1 x 1.7 cm vegetation, tricuspid valve mild to moderate regurgitation, thickened mitral valve cannot rule out vegetation, abnormal mitral valve.  Blood cultures from 06/11/2019 were growing staph aureus.  Repeat blood cultures 06/13/2019 with cocci in clusters.  Repeat cultures obtained 06/16/2019 with no growth to date.  Continue IV nafcillin will likely need total of 6 weeks of IV antibiotics.  ID following and appreciate input and recommendations.  2.  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  Patient with MSSA bacteremia.  Continue IV nafcillin.  Per ID.  3.  Septic emboli/splenic infarct Continue IV nafcillin.  We will give a dose of Lasix 40 mg IV x1.  4.  Left knee septic arthritis Status post I&D of the left knee.  Continue IV nafcillin.  Pain management.  Will ask orthopedics to reassess knee tomorrow.  Supportive care.  5.  Opiate abuse Patient uses IV heroin.  Patient states was at the methadone clinic and was getting methadone 35 mg daily and requesting her current dose be increased.  Patient currently on methadone 10 mg twice a day.  Pharmacy contacted methadone clinic and per pharmacist patient noted not to be in the system although patient adamant she goes there.  Patient asking for methadone dose to be increased back to 35 mg daily.  Methadone has been increased to 35 mg daily,  as patient with complaints of hot flashes, chills.  Continue the clonidine detox protocol. Continue current regimen of Percocet for pain as needed.  Will not escalate pain medication.  6.  Untreated/positive hepatitis C HCV RNA pending.  ID following.  7.  hypokalemia Repleted.  Potassium at 3.9.  Magnesium at 2.0.   8.  Tobacco abuse Continue nicotine patch.    9.  Thrombocytopenia Resolved.  10.  Iron deficiency anemia Status post 2 units packed red blood cells on 06/15/2019.  Hemoglobin currently stable at 9.9.  Will likely  need oral iron supplementation on discharge.  May consider IV iron during this hospitalization however will discuss with ID on Monday.    DVT prophylaxis: Lovenox Code Status: Full Family Communication: Updated patient.  No family at bedside.  Disposition:   Status is: Inpatient    Dispo: The patient is from: Home              Anticipated d/c is to: Likely home               Anticipated d/c date is: In approximately 5 weeks.              Patient currently on IV nafcillin and will need to complete 6 weeks of therapy prior to discharge as patient with IV drug abuse.        Consultants:  Infectious disease: Dr. Baxter Flattery  Procedures:  MRI brain from 06/16/2019 PICC line  Antimicrobials:  IV nafcillin 06/15/2019 (D8/42) >>>>>   Subjective: Patient in bed.  Patient noted to have had a T-max of 100.8 earlier this morning.  Patient with some complaints of sacral pain.  Patient denies any discharge.  Patient concerned that she may have a UTI although she denies any significant dysuria.   Objective: Vitals:   06/20/19 2101 06/21/19 0514 06/21/19 0815 06/21/19 0937  BP: 117/72 123/65 107/68 118/77  Pulse: (!) 110 (!) 113 96 95  Resp: 18 18 19 18   Temp: 99 F (37.2 C) (!) 100.8 F (38.2 C) 99.5 F (37.5 C) 98.7 F (37.1 C)  TempSrc: Oral Oral Oral Oral  SpO2: 98% 97% 97% 96%  Weight:      Height:        Intake/Output Summary (Last 24 hours) at 06/21/2019 1304 Last data filed at 06/20/2019 1800 Gross per 24 hour  Intake 388.7 ml  Output 2300 ml  Net -1911.3 ml   Filed Weights   06/16/19 0110 06/16/19 0816  Weight: 49.9 kg 65.2 kg    Examination:  General exam: NAD Respiratory system: Decreased scattered coarse breath sounds/crackles.  Fair air movement.  Speaking in full sentences. Cardiovascular system: RRR no murmurs rubs or gallops.  No JVD.  No lower extremity edema.  Gastrointestinal system: Abdomen is soft, nontender, nondistended, positive bowel sounds.  No rebound.  No guarding. Central nervous system: Alert and oriented. No focal neurological deficits. Extremities: Left knee with dressing noted, no significant surrounding erythema.  Skin: No rashes, lesions or ulcers Psychiatry: Judgement and insight appear normal. Mood & affect appropriate.     Data Reviewed: I have personally reviewed following labs and imaging  studies  CBC: Recent Labs  Lab 06/15/19 1421 06/16/19 0500 06/17/19 1211 06/18/19 1032 06/19/19 1051 06/20/19 1107 06/21/19 0143  WBC 19.5*   < > 20.0* 17.3* 19.0* 17.5* 17.6*  NEUTROABS 16.3*  --  15.5* 13.7* 15.4* 13.7*  --   HGB 6.8*   < > 10.5* 10.6* 9.6* 9.5* 9.9*  HCT 20.9*   < > 30.8* 31.3* 29.6* 29.6* 30.2*  MCV 80.4   < > 80.6 82.6 85.8 86.3 86.0  PLT 232   < > 293 308 331 401* 466*   < > = values in this interval not displayed.    Basic Metabolic Panel: Recent Labs  Lab 06/17/19 1211 06/18/19 1032 06/19/19 1051 06/20/19 1107 06/21/19 0143  NA 139 140 142 139 137  K 3.0* 3.4* 3.5 3.4* 3.9  CL 108 109 108 107 103  CO2 23 24 26 28 27   GLUCOSE 91  111* 100* 103* 121*  BUN <5* <5* 6 7 9   CREATININE 0.45 0.46 0.46 0.46 0.57  CALCIUM 7.3* 7.4* 7.8* 7.7* 7.9*  MG 1.7 2.0 2.0 1.8 2.0    GFR: Estimated Creatinine Clearance: 103.6 mL/min (by C-G formula based on SCr of 0.57 mg/dL).  Liver Function Tests: Recent Labs  Lab 06/16/19 0500 06/17/19 1211  AST 20 18  ALT 11 12  ALKPHOS 72 61  BILITOT 0.9 0.8  PROT 6.7 6.4*  ALBUMIN 1.8* 1.6*    CBG: No results for input(s): GLUCAP in the last 168 hours.   Recent Results (from the past 240 hour(s))  Body fluid culture     Status: None   Collection Time: 06/11/19  2:03 PM   Specimen: Synovium; Synovial Fluid  Result Value Ref Range Status   Specimen Description SYNOVIAL FLUID KNEE LEFT  Final   Special Requests   Final    NONE Performed at [facility] [address], [place], [place] [address]    Gram Stain   Final    ABUNDANT WBC PRESENT,BOTH PMN AND MONONUCLEAR NO ORGANISMS SEEN Gram Stain Report Called to,Read Back By and Verified With: DR. Lyla Glassing AT 1512 ON 06/11/19 BY N.Jakylan Ron Performed at [facility] [place] [address] [place], [place] [address]    Culture   Final    NO GROWTH 3 DAYS Performed at [facility], [place] [address], [place], [place]  [address]    Report Status 06/14/2019 FINAL  Final  Anaerobic culture     Status: None   Collection Time: 06/11/19  2:03 PM   Specimen: Synovium; Synovial Fluid  Result Value Ref Range Status   Specimen Description SYNOVIAL FLUID KNEE LEFT  Final   Special Requests   Final    NONE Performed at [facility] [address], [place], [place] [address]    Culture   Final    NO ANAEROBES ISOLATED Performed at [facility], [place] [address] [place], [place] [address]    Report Status 06/16/2019 FINAL  Final  Aerobic/Anaerobic Culture (surgical/deep wound)     Status: None   Collection Time: 06/11/19  6:30 PM   Specimen: Synovial, Left Knee; Body Fluid  Result Value Ref Range Status   Specimen Description   Final    SYNOVIAL KNEE LEFT Performed at [facility] [address], [place], [place] [address]    Special Requests   Final    NONE Performed at [facility], [place] [address], [place], [place] [address]    Gram Stain   Final    RARE WBC PRESENT, PREDOMINANTLY PMN NO ORGANISMS SEEN    Culture   Final    RARE STAPHYLOCOCCUS AUREUS NO ANAEROBES ISOLATED Performed at [facility], [place] [address], [place], [place] [address]    Report Status 06/16/2019 FINAL  Final   Organism ID, Bacteria STAPHYLOCOCCUS AUREUS  Final      Susceptibility   Staphylococcus aureus - MIC*    CIPROFLOXACIN >=8 RESISTANT Resistant     ERYTHROMYCIN >=8 RESISTANT Resistant     GENTAMICIN <=0.5 SENSITIVE Sensitive     OXACILLIN 0.5 SENSITIVE Sensitive     TETRACYCLINE <=1 SENSITIVE Sensitive     VANCOMYCIN <=0.5 SENSITIVE Sensitive     TRIMETH/SULFA <=10 SENSITIVE Sensitive     CLINDAMYCIN <=0.25 SENSITIVE Sensitive     RIFAMPIN <=0.5 SENSITIVE Sensitive     Inducible Clindamycin NEGATIVE Sensitive     *  RARE STAPHYLOCOCCUS AUREUS  Aerobic/Anaerobic Culture (surgical/deep wound)     Status: None   Collection Time: 06/11/19  6:32 PM    Specimen: Synovium; Tissue  Result Value Ref Range Status   Specimen Description   Final    SYNOVIAL TISSUE KNEE LEFT Performed at [facility] [address] [place], [place] [address]    Special Requests   Final    NONE Performed at [facility], [place] [address] [place], [place] [address]    Gram Stain NO WBC SEEN NO ORGANISMS SEEN   Final   Culture   Final    RARE STAPHYLOCOCCUS AUREUS NO ANAEROBES ISOLATED Performed at [facility], [place] [address] [place], [place] [address]    Report Status 06/17/2019 FINAL  Final   Organism ID, Bacteria STAPHYLOCOCCUS AUREUS  Final      Susceptibility   Staphylococcus aureus - MIC*    CIPROFLOXACIN >=8 RESISTANT Resistant     ERYTHROMYCIN >=8 RESISTANT Resistant     GENTAMICIN <=0.5 SENSITIVE Sensitive     OXACILLIN 0.5 SENSITIVE Sensitive     TETRACYCLINE <=1 SENSITIVE Sensitive     VANCOMYCIN 1 SENSITIVE Sensitive     TRIMETH/SULFA <=10 SENSITIVE Sensitive     CLINDAMYCIN <=0.25 SENSITIVE Sensitive     RIFAMPIN <=0.5 SENSITIVE Sensitive     Inducible Clindamycin NEGATIVE Sensitive     * RARE STAPHYLOCOCCUS AUREUS  Surgical PCR screen     Status: Abnormal   Collection Time: 06/11/19  8:49 PM   Specimen: Nasal Mucosa; Nasal Swab  Result Value Ref Range Status   MRSA, PCR NEGATIVE NEGATIVE Final   Staphylococcus aureus POSITIVE (A) NEGATIVE Final    Comment: (NOTE) The Xpert SA Assay (FDA approved for NASAL specimens in patients 23 years of age and older), is one component of a comprehensive surveillance program. It is not intended to diagnose infection nor to guide or monitor treatment. Performed at [facility], [place] [address], [place], [place] [address]   Culture, blood (routine x 2)     Status: Abnormal   Collection Time: 06/13/19 11:53 AM   Specimen: BLOOD LEFT HAND  Result Value Ref Range Status   Specimen Description   Final    BLOOD LEFT HAND Performed  at [facility] [address], [place], [place] [address]    Special Requests   Final    BOTTLES DRAWN AEROBIC AND ANAEROBIC Blood Culture adequate volume Performed at [facility] [address], [place], [place] [address]    Culture  Setup Time   Final    GRAM POSITIVE COCCI IN CLUSTERS AEROBIC BOTTLE ONLY CRITICAL RESULT CALLED TO, READ BACK BY AND VERIFIED WITH: PHARMD C SHADE [id number] AT 60 AM BY CM    Culture (A)  Final    STAPHYLOCOCCUS AUREUS SUSCEPTIBILITIES PERFORMED ON PREVIOUS CULTURE WITHIN THE LAST 5 DAYS. Performed at [facility], [place] [address] [place], [place] [address]    Report Status 06/15/2019 FINAL  Final  Culture, blood (routine x 2)     Status: Abnormal   Collection Time: 06/13/19 11:53 AM   Specimen: BLOOD RIGHT HAND  Result Value Ref Range Status   Specimen Description   Final    BLOOD RIGHT HAND Performed at [facility] [address], [place], [place] [address]    Special Requests   Final    BOTTLES DRAWN AEROBIC AND ANAEROBIC Blood Culture results may  not be optimal due to an inadequate volume of blood received in culture bottles Performed at [facility] [address] [place], [place] [address]    Culture  Setup Time   Final    GRAM POSITIVE COCCI IN CLUSTERS AEROBIC BOTTLE ONLY CRITICAL RESULT CALLED TO, READ BACK BY AND VERIFIED WITH: PHARMD C SHADE [id number] AT 2 AM BY CM    Culture (A)  Final    STAPHYLOCOCCUS AUREUS SUSCEPTIBILITIES PERFORMED ON PREVIOUS CULTURE WITHIN THE LAST 5 DAYS. Performed at [facility], [place] [address] [place], [place] [address]    Report Status 06/15/2019 FINAL  Final  Culture, blood (Routine X 2) w Reflex to ID Panel     Status: None   Collection Time: 06/16/19  4:33 PM   Specimen: BLOOD LEFT HAND  Result Value Ref Range Status   Specimen Description   Final    BLOOD LEFT HAND Performed at [facility] [address] [place], [place] [address]    Special Requests   Final    BOTTLES DRAWN AEROBIC ONLY Blood Culture adequate volume Performed at [facility] [address] [place], [place] [address]    Culture   Final    NO GROWTH 5 DAYS Performed at [facility], [place] [address] [place], [place] [address]    Report Status 06/21/2019 FINAL  Final  Culture, blood (Routine X 2) w Reflex to ID Panel     Status: None   Collection Time: 06/16/19  4:40 PM   Specimen: BLOOD LEFT HAND  Result Value Ref Range Status   Specimen Description   Final    BLOOD LEFT HAND Performed at [facility] [address], [place], [place] [address]    Special Requests   Final    BOTTLES DRAWN AEROBIC ONLY Blood Culture results may not be optimal due to an inadequate volume of blood received in culture bottles Performed at [facility] [address], [place], [place] [address]    Culture   Final    NO GROWTH 5 DAYS Performed at [facility], [place] [address], [place], [place] [address]    Report Status 06/21/2019 FINAL  Final         Radiology Studies: No results found.      Scheduled Meds:  Chlorhexidine Gluconate Cloth  6 each Topical Daily   enoxaparin (LOVENOX) injection  40 mg Subcutaneous Q24H   methadone  35 mg Oral Daily   nicotine  21 mg Transdermal Daily   saccharomyces boulardii  250 mg Oral BID   sodium chloride flush  10-40 mL Intracatheter Q12H   Continuous Infusions:  nafcillin (NAFCIL) continuous infusion 12 g (06/20/19 1555)     LOS: 6 days    Time spent: 35 minutes    Irine Seal, MD [facility]   To contact the attending provider between 7A-7P or the covering provider during after hours 7P-7A, please log into the web site [email or web address] and access using universal [place] password for that web site. If you do not have the password, please call the hospital  operator.  06/21/2019, 1:04 PM

## 2019-06-21 NOTE — Progress Notes (Signed)
CH[facility] NOTE REDACTION - DIFFERENT PATIENT  Due to an Incorrect Registration or Identity Theft, we are redacting this note. In these particular instances; both names are NOT the same patient.  We have changed the demographic information to reflect those of the correct patient.  Please review for accuracy and ? Mark Complt the note.  Please do not refresh the body of the note.   Should the change in demographics impact clinical decisions that had been made at the time; You may choose to Edit Note from our Note Redaction.  This will create an [place] [employer] Identity Application Analyst [id number]) [phone] [email or web address]

## 2019-06-21 NOTE — Progress Notes (Signed)
Onset of new pain to right posterior flank, tender to touch, edema noted, no erythema present.

## 2019-06-22 ENCOUNTER — Inpatient Hospital Stay (HOSPITAL_COMMUNITY): Payer: Self-pay

## 2019-06-22 LAB — CBC WITH DIFFERENTIAL/PLATELET
Abs Immature Granulocytes: 0.07 10*3/uL (ref 0.00–0.07)
Basophils Absolute: 0.1 10*3/uL (ref 0.0–0.1)
Basophils Relative: 1 %
Eosinophils Absolute: 0.1 10*3/uL (ref 0.0–0.5)
Eosinophils Relative: 1 %
HCT: 31 % — ABNORMAL LOW (ref 36.0–46.0)
Hemoglobin: 9.8 g/dL — ABNORMAL LOW (ref 12.0–15.0)
Immature Granulocytes: 1 %
Lymphocytes Relative: 18 %
Lymphs Abs: 2.2 10*3/uL (ref 0.7–4.0)
MCH: 27.5 pg (ref 26.0–34.0)
MCHC: 31.6 g/dL (ref 30.0–36.0)
MCV: 87.1 fL (ref 80.0–100.0)
Monocytes Absolute: 1 10*3/uL (ref 0.1–1.0)
Monocytes Relative: 8 %
Neutro Abs: 8.8 10*3/uL — ABNORMAL HIGH (ref 1.7–7.7)
Neutrophils Relative %: 71 %
Platelets: 495 10*3/uL — ABNORMAL HIGH (ref 150–400)
RBC: 3.56 MIL/uL — ABNORMAL LOW (ref 3.87–5.11)
RDW: 16.4 % — ABNORMAL HIGH (ref 11.5–15.5)
WBC: 12.3 10*3/uL — ABNORMAL HIGH (ref 4.0–10.5)
nRBC: 0 % (ref 0.0–0.2)

## 2019-06-22 LAB — URINE CULTURE: Culture: <10000 — AB

## 2019-06-22 LAB — BASIC METABOLIC PANEL
Anion gap: 8 (ref 5–15)
BUN: 10 mg/dL (ref 6–20)
CO2: 28 mmol/L (ref 22–32)
Calcium: 8.1 mg/dL — ABNORMAL LOW (ref 8.9–10.3)
Chloride: 103 mmol/L (ref 98–111)
Creatinine, Ser: 0.46 mg/dL (ref 0.44–1.00)
GFR calc Af Amer: >60 mL/min (ref >60)
GFR calc non Af Amer: >60 mL/min (ref >60)
Glucose, Bld: 99 mg/dL (ref 70–99)
Potassium: 4 mmol/L (ref 3.5–5.1)
Sodium: 139 mmol/L (ref 135–145)

## 2019-06-22 LAB — MAGNESIUM: Magnesium: 2 mg/dL (ref 1.7–2.4)

## 2019-06-22 NOTE — Progress Notes (Signed)
MEWS ([place]). Pt has been experiencing pain.  HR 120, RR 23. Pt has also been out of bed. Hospitalist provider notified. No additional changes to plan of care at this time. Charge nurse also notified. Pt remains stable, will continue to monitor.

## 2019-06-22 NOTE — Progress Notes (Addendum)
Patient transported via wheelchair to [facility] by Gerald Stabs from transport, left floor at 2218.  Patient returned to room at 2240.

## 2019-06-22 NOTE — Progress Notes (Addendum)
    [facility] for Infectious Disease    Date of Admission:  06/15/2019   Total days of antibiotics 14           ID: Michelle Doyle is a 29 y.o. female with   Active Problems:   Endocarditis   Hypokalemia   Septic arthritis ([place])   Splenic infarct   Bacteremia due to methicillin susceptible Staphylococcus aureus (MSSA)   IV drug abuse ([place])   Tobacco abuse   Positive hepatitis C antibody test    Subjective: Persistent fever for the past 4 days, but leukocytosis improved  Medications:   Chlorhexidine Gluconate Cloth  6 each Topical Daily   enoxaparin (LOVENOX) injection  40 mg Subcutaneous Q24H   methadone  35 mg Oral Daily   nicotine  21 mg Transdermal Daily   saccharomyces boulardii  250 mg Oral BID   sodium chloride flush  10-40 mL Intracatheter Q12H    Objective: Vital signs in last 24 hours: Temp:  [99 F (37.2 C)-101.5 F (38.6 C)] 100 F (37.8 C) (04/19 1605) Pulse Rate:  [105-128] 126 (04/19 1605) Resp:  [14-23] 20 (04/19 1605) BP: (96-114)/(62-75) 111/75 (04/19 1605) SpO2:  [97 %-99 %] 97 % (04/19 1605)    Lab Results Recent Labs    06/21/19 0143 06/22/19 0147  WBC 17.6* 12.3*  HGB 9.9* 9.8*  HCT 30.2* 31.0*  NA 137 139  K 3.9 4.0  CL 103 103  CO2 27 28  BUN 9 10  CREATININE 0.57 0.46    Microbiology: reviewed Studies/Results: No results found.   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 = recommend to get chest CT to see if has effusion or empyema that may need to be drained.  Continue on nafcillin  [facility] Cell: [phone] Pager: [phone]  06/22/2019, 4:44 PM

## 2019-06-22 NOTE — Progress Notes (Addendum)
PROGRESS NOTE    Michelle Doyle  [id number] DOB: December 02, 1990 DOA: 06/15/2019 PCP: Patient, No Pcp Per    Chief Complaint  Patient presents with   Abnormal Lab    Brief Narrative: (Start on day 1 of progress note - keep it brief and live) 29 year old with PMH significant for IV heroin abuse, Untreated Hepatitis C, initially admitted for acute heroin withdrawal and sepsis. Patient was transfer to [facility] 4/12 for TEE, but it was cancel due to severe anemia, Hb at 6. Patient Decided left AMA from [facility] Her Grandfather brought her back to [place].    Patient was found to have disseminated MSSA Bacteremia, mitral valve and tricuspid valve endocarditis, pulmonary septic emboli, splenic emboli, probable septic arthritis of the left knee.  ID following.    Assessment & Plan:   Active Problems:   Endocarditis   Hypokalemia   Septic arthritis ([place])   Splenic infarct   Bacteremia due to methicillin susceptible Staphylococcus aureus (MSSA)   IV drug abuse ([place])   Tobacco abuse   Positive hepatitis C antibody test  1 disseminated MSSA bacteremia, POA Patient noted to have disseminated MSSA bacteremia with pulmonary septic emboli, splenic emboli, probable septic arthritis of the left knee, tricuspid and mitral valve endocarditis.  MRI of the brain was obtained incomplete study patient refused to complete the study however no significant abnormality detected.  2D echo done with thickening tricuspid valve 1.1 x 1.7 cm vegetation, tricuspid valve mild to moderate regurgitation, thickened mitral valve cannot rule out vegetation, abnormal mitral valve.  Blood cultures from 06/11/2019 were growing staph aureus.  Repeat blood cultures 06/13/2019 with cocci in clusters.  Repeat cultures obtained 06/16/2019 with no growth to date.  Patient complaining of sweating overnight with subjective fevers.  RN noted patient with a temperature of 101.6 this afternoon.  ID recommending CT chest to  rule out effusion or empyema which may need to be drained. Continue IV nafcillin will likely need total of 6 weeks of IV antibiotics.  ID following and appreciate input and recommendations.  2.  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  Patient with MSSA bacteremia.  Continue IV nafcillin.  Per ID.  3.  Septic emboli/splenic infarct Continue IV nafcillin.  Patient received Lasix 40 mg IV x1(06/21/2019). Follow.    4.  Left knee septic arthritis Status post I&D of the left knee.  Continue IV nafcillin.  Pain management.  Spoke with orthopedics who was planning on seeing patient this week.  Supportive care.  5.  Opiate abuse Patient uses IV heroin.  Patient states was at the methadone clinic and was getting methadone 35 mg daily and requesting her current dose be increased.  Patient currently on methadone 10 mg twice a day.  Pharmacy contacted methadone clinic and per pharmacist patient noted not to be in the system although patient adamant she goes there.  Methadone has been increased to 35 mg daily,  as patient with complaints of hot flashes, chills.  Continue the clonidine detox protocol. Continue current regimen of Percocet for pain as needed.  Will not escalate pain medication.  6.  Untreated/positive hepatitis C HCV RNA pending.  ID following.  7.  hypokalemia Repleted.  Potassium at 4.  Magnesium at 2.0.   8.  Tobacco abuse Nicotine patch.    9.  Thrombocytopenia Resolved.  10.  Iron deficiency anemia Status post 2 units packed red blood cells on 06/15/2019.  Hemoglobin currently stable at 9.8.  Will likely need oral iron supplementation on discharge.     DVT prophylaxis: Lovenox Code Status: Full Family Communication: Updated patient.  Updated mother at bedside. Disposition:   Status is: Inpatient    Dispo: The patient is  from: Home              Anticipated d/c is to: Likely home              Anticipated d/c date is: In approximately 5 weeks.              Patient currently on IV nafcillin and will need to complete 6 weeks of therapy prior to discharge as patient with IV drug abuse.  Pending clearance by ID.        Consultants:  Infectious disease: Dr. Baxter Flattery  Procedures:  MRI brain from 06/16/2019 PICC line  Antimicrobials:  IV nafcillin 06/15/2019 (D8/42) >>>>>   Subjective: Patient in bed.  Mother at bedside.  Patient does state felt she had fevers overnight as bed sheets were drenched and soaked and was unable to get any help until he was changed this morning by oncoming shift.  Few shortness of breath improving.  Denies any chest pain.  Denies any dysuria.  Thinks lower back pain likely more musculoskeletal.  Patient noted per RN to have a temperature of 101.5 this afternoon.    Objective: Vitals:   06/21/19 1817 06/21/19 2006 06/22/19 0651 06/22/19 0836  BP: 114/70 102/65 96/62 112/69  Pulse: (!) 124 (!) 118 (!) 105 (!) 111  Resp: 20 20 14 18   Temp: 99 F (37.2 C) 99.6 F (37.6 C) 99.6 F (37.6 C) 99.4 F (37.4 C)  TempSrc: Oral Oral Oral Oral  SpO2: 97% 97% 97% 97%  Weight:      Height:        Intake/Output Summary (Last 24 hours) at 06/22/2019 1156 Last data filed at 06/22/2019 0600 Gross per 24 hour  Intake 689.51 ml  Output 800 ml  Net -110.49 ml   Filed Weights   06/16/19 0110 06/16/19 0816  Weight: 49.9 kg 65.2 kg    Examination:  General exam: NAD Respiratory system: Scattered coarse breath sounds improving.  No wheezing.  Fair air movement.  Speaking in full sentences.   Cardiovascular system: Regular rate rhythm no murmurs rubs or gallops.  No JVD.  No lower extremity edema.  Gastrointestinal system: Abdomen is nontender, nondistended, soft, positive bowel sounds.  No rebound.  No guarding.  Central nervous system: Alert and oriented. No focal neurological  deficits. Extremities: Left knee with dressing noted, no significant surrounding erythema.  Skin: No rashes, lesions or ulcers Psychiatry: Judgement and insight appear normal. Mood & affect appropriate.     Data Reviewed: I have personally reviewed following labs and imaging studies  CBC: Recent Labs  Lab 06/17/19 1211 06/17/19 1211 06/18/19 1032 06/19/19 1051 06/20/19 1107 06/21/19 0143 06/22/19 0147  WBC 20.0*   < > 17.3* 19.0* 17.5* 17.6* 12.3*  NEUTROABS 15.5*  --  13.7* 15.4* 13.7*  --  8.8*  HGB 10.5*   < > 10.6* 9.6* 9.5* 9.9* 9.8*  HCT 30.8*   < > 31.3* 29.6* 29.6* 30.2* 31.0*  MCV 80.6   < > 82.6 85.8 86.3 86.0 87.1  PLT 293   < > 308 331 401* 466* 495*   < > = values in  this interval not displayed.    Basic Metabolic Panel: Recent Labs  Lab 06/18/19 1032 06/19/19 1051 06/20/19 1107 06/21/19 0143 06/22/19 0147  NA 140 142 139 137 139  K 3.4* 3.5 3.4* 3.9 4.0  CL 109 108 107 103 103  CO2 24 26 28 27 28   GLUCOSE 111* 100* 103* 121* 99  BUN <5* 6 7 9 10   CREATININE 0.46 0.46 0.46 0.57 0.46  CALCIUM 7.4* 7.8* 7.7* 7.9* 8.1*  MG 2.0 2.0 1.8 2.0 2.0    GFR: Estimated Creatinine Clearance: 103.6 mL/min (by C-G formula based on SCr of 0.46 mg/dL).  Liver Function Tests: Recent Labs  Lab 06/16/19 0500 06/17/19 1211  AST 20 18  ALT 11 12  ALKPHOS 72 61  BILITOT 0.9 0.8  PROT 6.7 6.4*  ALBUMIN 1.8* 1.6*    CBG: No results for input(s): GLUCAP in the last 168 hours.   Recent Results (from the past 240 hour(s))  Culture, blood (routine x 2)     Status: Abnormal   Collection Time: 06/13/19 11:53 AM   Specimen: BLOOD LEFT HAND  Result Value Ref Range Status   Specimen Description   Final    BLOOD LEFT HAND Performed at [facility] [address], [place], [place] [address]    Special Requests   Final    BOTTLES DRAWN AEROBIC AND ANAEROBIC Blood Culture adequate volume Performed at [facility]  [address], [place], [place] [address]    Culture  Setup Time   Final    GRAM POSITIVE COCCI IN CLUSTERS AEROBIC BOTTLE ONLY CRITICAL RESULT CALLED TO, READ BACK BY AND VERIFIED WITH: PHARMD C SHADE [id number] AT 33 AM BY CM    Culture (A)  Final    STAPHYLOCOCCUS AUREUS SUSCEPTIBILITIES PERFORMED ON PREVIOUS CULTURE WITHIN THE LAST 5 DAYS. Performed at [facility], [place] [address], [place], [place] [address]    Report Status 06/15/2019 FINAL  Final  Culture, blood (routine x 2)     Status: Abnormal   Collection Time: 06/13/19 11:53 AM   Specimen: BLOOD RIGHT HAND  Result Value Ref Range Status   Specimen Description   Final    BLOOD RIGHT HAND Performed at [facility] [address], [place], [place] [address]    Special Requests   Final    BOTTLES DRAWN AEROBIC AND ANAEROBIC Blood Culture results may not be optimal due to an inadequate volume of blood received in culture bottles Performed at [facility] [address] [place], [place] [address]    Culture  Setup Time   Final    GRAM POSITIVE COCCI IN CLUSTERS AEROBIC BOTTLE ONLY CRITICAL RESULT CALLED TO, READ BACK BY AND VERIFIED WITH: PHARMD C SHADE [id number] AT 58 AM BY CM    Culture (A)  Final    STAPHYLOCOCCUS AUREUS SUSCEPTIBILITIES PERFORMED ON PREVIOUS CULTURE WITHIN THE LAST 5 DAYS. Performed at [facility], [place] [address] [place], [place] [address]    Report Status 06/15/2019 FINAL  Final  Culture, blood (Routine X 2) w Reflex to ID Panel     Status: None   Collection Time: 06/16/19  4:33 PM   Specimen: BLOOD LEFT HAND  Result Value Ref Range Status   Specimen Description   Final    BLOOD LEFT HAND Performed at [facility] [address], [place], [place] [address]    Special Requests   Final  BOTTLES DRAWN AEROBIC ONLY Blood Culture adequate volume Performed at [facility] [address] [place], [place] [address]     Culture   Final    NO GROWTH 5 DAYS Performed at [facility], [place] [address] [place], [place] [address]    Report Status 06/21/2019 FINAL  Final  Culture, blood (Routine X 2) w Reflex to ID Panel     Status: None   Collection Time: 06/16/19  4:40 PM   Specimen: BLOOD LEFT HAND  Result Value Ref Range Status   Specimen Description   Final    BLOOD LEFT HAND Performed at [facility] [address], [place], [place] [address]    Special Requests   Final    BOTTLES DRAWN AEROBIC ONLY Blood Culture results may not be optimal due to an inadequate volume of blood received in culture bottles Performed at [facility] [address], [place], [place] [address]    Culture   Final    NO GROWTH 5 DAYS Performed at [facility], [place] [address] [place], [place] [address]    Report Status 06/21/2019 FINAL  Final         Radiology Studies: No results found.      Scheduled Meds:  Chlorhexidine Gluconate Cloth  6 each Topical Daily   enoxaparin (LOVENOX) injection  40 mg Subcutaneous Q24H   methadone  35 mg Oral Daily   nicotine  21 mg Transdermal Daily   saccharomyces boulardii  250 mg Oral BID   sodium chloride flush  10-40 mL Intracatheter Q12H   Continuous Infusions:  nafcillin (NAFCIL) continuous infusion 12 g (06/21/19 1659)     LOS: 7 days    Time spent: 35 minutes    Irine Seal, MD [facility]   To contact the attending provider between 7A-7P or the covering provider during after hours 7P-7A, please log into the web site [email or web address] and access using universal [employer] password for that web site. If you do not have the password, please call the hospital operator.  06/22/2019, 11:56 AM

## 2019-06-22 NOTE — Progress Notes (Signed)
   06/22/19 1459  Assess: MEWS Score  Temp (!) 101.5 F (38.6 C)  BP 113/72  Assess: MEWS Score  MEWS Temp 2  MEWS Systolic 0  MEWS Pulse 2  MEWS RR 0  MEWS LOC 0  MEWS Score 4  MEWS Score Color Red  Assess: if the MEWS score is Yellow or Red  Were vital signs taken at a resting state? Yes  Focused Assessment Documented focused assessment  MEWS guidelines implemented *See Row Information* Yes  Treat  MEWS Interventions Administered prn meds/treatments;Escalated (See documentation below)  Take Vital Signs  Increase Vital Sign Frequency  Red: Q 1hr X 4 then Q 4hr X 4, if remains red, continue Q 4hrs  Escalate  MEWS: Escalate Red: discuss with charge nurse/RN and provider, consider discussing with RRT  Notify: Charge Nurse/RN  Name of Charge Nurse/RN Notified Marissa Long CN RN  Date Charge Nurse/RN Notified 06/22/19  Time Charge Nurse/RN Notified [id number]  Notify: Provider  Provider Name/Title [place] Silos (also awaiting ID)  Date Provider Notified 06/22/19  Time Provider Notified 1510  Notification Type Page  Notification Reason Change in status  Response Other (Comment) (pt c/o pain 7/10  back. prn administered)  Date of Provider Response 06/22/19  Time of Provider Response 1512

## 2019-06-23 LAB — BASIC METABOLIC PANEL
Anion gap: 8 (ref 5–15)
BUN: 10 mg/dL (ref 6–20)
CO2: 26 mmol/L (ref 22–32)
Calcium: 8.1 mg/dL — ABNORMAL LOW (ref 8.9–10.3)
Chloride: 105 mmol/L (ref 98–111)
Creatinine, Ser: 0.54 mg/dL (ref 0.44–1.00)
GFR calc Af Amer: >60 mL/min (ref >60)
GFR calc non Af Amer: >60 mL/min (ref >60)
Glucose, Bld: 116 mg/dL — ABNORMAL HIGH (ref 70–99)
Potassium: 3.8 mmol/L (ref 3.5–5.1)
Sodium: 139 mmol/L (ref 135–145)

## 2019-06-23 LAB — CBC WITH DIFFERENTIAL/PLATELET
Abs Immature Granulocytes: 0.07 10*3/uL (ref 0.00–0.07)
Basophils Absolute: 0.1 10*3/uL (ref 0.0–0.1)
Basophils Relative: 1 %
Eosinophils Absolute: 0.1 10*3/uL (ref 0.0–0.5)
Eosinophils Relative: 1 %
HCT: 30 % — ABNORMAL LOW (ref 36.0–46.0)
Hemoglobin: 9.5 g/dL — ABNORMAL LOW (ref 12.0–15.0)
Immature Granulocytes: 1 %
Lymphocytes Relative: 15 %
Lymphs Abs: 1.9 10*3/uL (ref 0.7–4.0)
MCH: 27.5 pg (ref 26.0–34.0)
MCHC: 31.7 g/dL (ref 30.0–36.0)
MCV: 87 fL (ref 80.0–100.0)
Monocytes Absolute: 0.8 10*3/uL (ref 0.1–1.0)
Monocytes Relative: 6 %
Neutro Abs: 9.6 10*3/uL — ABNORMAL HIGH (ref 1.7–7.7)
Neutrophils Relative %: 76 %
Platelets: 445 10*3/uL — ABNORMAL HIGH (ref 150–400)
RBC: 3.45 MIL/uL — ABNORMAL LOW (ref 3.87–5.11)
RDW: 16.3 % — ABNORMAL HIGH (ref 11.5–15.5)
WBC: 12.5 10*3/uL — ABNORMAL HIGH (ref 4.0–10.5)
nRBC: 0 % (ref 0.0–0.2)

## 2019-06-23 MED ORDER — HYDROXYZINE HCL 25 MG PO TABS
25.0000 mg | ORAL_TABLET | Freq: Four times a day (QID) | ORAL | Status: DC | PRN
  Administered 2019-06-26 – 2019-06-27 (×2): 25 mg via ORAL
  Filled 2019-06-23 (×2): qty 1

## 2019-06-23 MED ORDER — LOPERAMIDE HCL 2 MG PO CAPS: 2.0000 mg | ORAL_CAPSULE | ORAL | Status: DC | PRN

## 2019-06-23 MED ORDER — METHOCARBAMOL 500 MG PO TABS
500.0000 mg | ORAL_TABLET | Freq: Three times a day (TID) | ORAL | Status: DC | PRN
  Administered 2019-06-23 – 2019-06-27 (×6): 500 mg via ORAL
  Filled 2019-06-23 (×6): qty 1

## 2019-06-23 MED ORDER — ONDANSETRON 4 MG PO TBDP: 4.0000 mg | ORAL_TABLET | Freq: Four times a day (QID) | ORAL | Status: DC | PRN

## 2019-06-23 MED ORDER — DICYCLOMINE HCL 20 MG PO TABS
20.0000 mg | ORAL_TABLET | Freq: Four times a day (QID) | ORAL | Status: DC | PRN
  Filled 2019-06-23: qty 1

## 2019-06-23 MED ORDER — FUROSEMIDE 10 MG/ML IJ SOLN
20.0000 mg | Freq: Once | INTRAMUSCULAR | Status: AC
  Administered 2019-06-23: 20 mg via INTRAVENOUS
  Filled 2019-06-23: qty 2

## 2019-06-23 MED ORDER — NAPROXEN 250 MG PO TABS
500.0000 mg | ORAL_TABLET | Freq: Two times a day (BID) | ORAL | Status: DC | PRN
  Administered 2019-06-24 – 2019-06-25 (×2): 500 mg via ORAL
  Filled 2019-06-23 (×4): qty 2

## 2019-06-23 NOTE — Progress Notes (Addendum)
[name] for Infectious Disease    Date of Admission:  06/15/2019   Total days of antibiotics 15 nafcillin           ID: Michelle Doyle is a 29 y.o. female with disseminated MSSA disease with ongoing fever Active Problems:   Endocarditis   Hypokalemia   Septic arthritis ([place])   Splenic infarct   Bacteremia due to methicillin susceptible Staphylococcus aureus (MSSA)   IV drug abuse ([place])   Tobacco abuse   Positive hepatitis C antibody test    Subjective: Fever up to 101F yesterday afternoon but afebrile last night. WBC stable at 12.5 patient had Chest CT showing bilateral diffuse cavitary lesions some peripherally located. Complains of left knee pain and right flank pain, "feels like stretched muscle"  Medications:   Chlorhexidine Gluconate Cloth  6 each Topical Daily   enoxaparin (LOVENOX) injection  40 mg Subcutaneous Q24H   methadone  35 mg Oral Daily   nicotine  21 mg Transdermal Daily   saccharomyces boulardii  250 mg Oral BID   sodium chloride flush  10-40 mL Intracatheter Q12H    Objective: Vital signs in last 24 hours: Temp:  [97.6 F (36.4 C)-100 F (37.8 C)] 99.3 F (37.4 C) (04/20 1339) Pulse Rate:  [105-128] 128 (04/20 1339) Resp:  [18-20] 18 (04/20 1339) BP: (99-117)/(39-88) 115/74 (04/20 1339) SpO2:  [96 %-99 %] 99 % (04/20 1339) Physical Exam  Constitutional:  oriented to person, place, and time. appears well-developed and well-nourished. No distress.  HENT: [place]/AT, PERRLA, no scleral icterus Mouth/Throat: Oropharynx is clear and moist. No oropharyngeal exudate.  Cardiovascular: Normal rate, regular rhythm and normal heart sounds. Exam reveals no gallop and no friction rub.  No murmur heard.  Pulmonary/Chest: Effort normal and breath sounds normal. No respiratory distress.  has no wheezes.  Neck = supple, no nuchal rigidity Abdominal: Soft. Bowel sounds are normal.  exhibits no distension. There is no tenderness.  Ext: left knee staples c/d/i no  erythema or drainage Neurological: alert and oriented to person, place, and time.  Skin: Skin is warm and dry. No rash noted. No erythema.  Psychiatric: a normal mood and affect.  behavior is normal.   Lab Results Recent Labs    06/22/19 0147 06/23/19 0831  WBC 12.3* 12.5*  HGB 9.8* 9.5*  HCT 31.0* 30.0*  NA 139 139  K 4.0 3.8  CL 103 105  CO2 28 26  BUN 10 10  CREATININE 0.46 0.54    Microbiology: Reviewed Studies/Results: CT CHEST WO CONTRAST  Result Date: 06/23/2019 CLINICAL DATA:  Follow-up endocarditis, bacteremia. Evaluate for empyema. EXAM: CT CHEST WITHOUT CONTRAST TECHNIQUE: Multidetector CT imaging of the chest was performed following the standard protocol without IV contrast. COMPARISON:  06/11/2019 FINDINGS: Cardiovascular: Heart is mildly enlarged. Small pericardial effusion. Aorta is normal caliber. Mediastinum/Nodes: Enlarged mediastinal lymph nodes, similar to prior study. AP window node has a short axis diameter of 10 mm. Precarinal lymph node has a short axis diameter of 11 mm. No axillary adenopathy. Lungs/Pleura: Numerous cavitary nodules and masses within the lungs as well as non cavitary nodules and masses and ground-glass opacities. Overall cavitation of many of the nodules have increased since prior study. Small bilateral pleural effusions, new since prior study. Upper Abdomen: Imaging into the upper abdomen shows no acute findings. Musculoskeletal: Chest wall soft tissues are unremarkable. No acute bony abnormality. IMPRESSION: Extensive cavitary and non cavitary nodules and masses throughout the lungs. Findings are similar to prior  study. Degree of cavitation has increased since prior study. Small bilateral effusions. Mediastinal adenopathy. Borderline heart size.  Small pericardial effusion. Electronically Signed   By: Rolm Baptise M.D.   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 Recommend 6 wk of treatment.  Pulmonary septic emboli with cavitary lesions = likely the cause of ongoing fever due to burden of disease, continue to monitor for increase oxygen need. High risk for pneumothorax since some of the affected area on peripheral. Continue on nafcillin  Fevers = can give acetominophen if needed for fever management  Flank pain =consider getting lumbar mri or CT to see if evidence of psoas abscess if she still has ongoing pain tomorrow  [facility]ious [facility] Cell: [phone] Pager: [phone]  06/23/2019, 5:02 PM

## 2019-06-23 NOTE — Progress Notes (Signed)
Received report from Truitt Merle.  No change in assessment.  Continue plan of care. Joseth Weigel, Laurel Dimmer, RN

## 2019-06-23 NOTE — Plan of Care (Signed)
  Problem: Coping: Goal: Level of anxiety will decrease Outcome: Progressing   Problem: Pain Managment: Goal: General experience of comfort will improve Outcome: Progressing   Problem: Activity: Goal: Risk for activity intolerance will decrease Outcome: Completed/Met   Problem: Nutrition: Goal: Adequate nutrition will be maintained Outcome: Completed/Met

## 2019-06-23 NOTE — Progress Notes (Addendum)
PROGRESS NOTE    Michelle Doyle  [id number] DOB: May 15, 1990 DOA: 06/15/2019 PCP: Patient, No Pcp Per    Chief Complaint  Patient presents with   Abnormal Lab    Brief Narrative:  29 year old with PMH significant for IV heroin abuse, Untreated Hepatitis C, initially admitted for acute heroin withdrawal and sepsis. Patient was transfer to [facility] 4/12 for TEE, but it was cancel due to severe anemia, Hb at 6. Patient Decided left AMA from [facility] Her Grandfather brought her back to [place].    Patient was found to have disseminated MSSA Bacteremia, mitral valve and tricuspid valve endocarditis, pulmonary septic emboli, splenic emboli, probable septic arthritis of the left knee.  ID following.    Assessment & Plan:   Active Problems:   Endocarditis   Hypokalemia   Septic arthritis ([place])   Splenic infarct   Bacteremia due to methicillin susceptible Staphylococcus aureus (MSSA)   IV drug abuse ([place])   Tobacco abuse   Positive hepatitis C antibody test  1 disseminated MSSA bacteremia, POA Patient noted to have disseminated MSSA bacteremia with pulmonary septic emboli, splenic emboli, probable septic arthritis of the left knee, tricuspid and mitral valve endocarditis.  MRI of the brain was obtained incomplete study patient refused to complete the study however no significant abnormality detected.  2D echo done with thickening tricuspid valve 1.1 x 1.7 cm vegetation, tricuspid valve mild to moderate regurgitation, thickened mitral valve cannot rule out vegetation, abnormal mitral valve.  Blood cultures from 06/11/2019 were growing staph aureus.  Repeat blood cultures 06/13/2019 with cocci in clusters.  Repeat cultures obtained 06/16/2019 with no growth to date.  Patient complaining of sweating overnight with subjective fevers.  RN noted patient with a temperature of 101.6 this afternoon.  ID recommending CT chest which was done on 1419 2021 which showed bilateral diffuse  cavitary lesion some peripherally located. Continue IV nafcillin will likely need total of 6 weeks of IV antibiotics.  ID following and appreciate input and recommendations.  2.  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  Patient with MSSA bacteremia.  Continue IV nafcillin.  Per ID.  3.  Septic emboli/splenic infarct Continue IV nafcillin.  Patient received Lasix 40 mg IV x1(06/21/2019).  We will give another dose of Lasix 20 mg IV x1.  Follow.  Follow.    4.  Left knee septic arthritis Status post I&D of the left knee.  Continue IV nafcillin.  Pain management.  Spoke with orthopedics who was planning on seeing patient this week.  Supportive care.  5.  Opiate abuse Patient uses IV heroin.  Patient states was at the methadone clinic and was getting methadone 35 mg daily and requesting her current dose be increased.  Patient currently on methadone 10 mg twice a day.  Pharmacy contacted methadone clinic and per pharmacist patient noted not to be in the system although patient adamant she goes there.  Methadone has been increased to 35 mg daily,  as patient with complaints of hot flashes, chills.  Continue the clonidine detox protocol. Continue current regimen of Percocet for pain as needed.  Will not escalate pain medication.  6.  Untreated/positive hepatitis C HCV RNA pending.  ID following.  7.  hypokalemia Repleted.  Potassium at 3.8.  Magnesium at 2.0.   8.  Tobacco abuse Nicotine patch.  9.  Thrombocytopenia Resolved.  10.  Iron deficiency anemia Status post 2 units packed red blood cells on 06/15/2019.  Hemoglobin currently stable at 9.5.  Will likely need oral iron supplementation on discharge.     DVT prophylaxis: Lovenox Code Status: Full Family Communication: Updated patient.  No family at bedside.  Disposition:   Status  is: Inpatient    Dispo: The patient is from: Home              Anticipated d/c is to: Likely home              Anticipated d/c date is: In approximately 4-5 weeks.              Patient currently on IV nafcillin and will need to complete 6 weeks of therapy prior to discharge as patient with IV drug abuse.  Pending clearance by ID.        Consultants:  Infectious disease: Dr. Baxter Flattery  Procedures:  MRI brain from 06/16/2019 PICC line CT chest 06/22/2019   Antimicrobials:  IV nafcillin 06/15/2019 (D8/42) >>>>>   Subjective: Patient in bed listening to music on her phone.  States shortness of breath slowly improving.  Complaining of some difficulty ambulating.  Still ongoing fevers.  States withdrawal symptoms slowly improving.  Feels at night sweats secondary to withdrawal symptoms.  T-max of 100 at 602 AM this morning.  Patient also with some complaints of left knee pain and right flank pain feeling like she may have pulled a muscle.  Objective: Vitals:   06/22/19 2203 06/23/19 0203 06/23/19 0238 06/23/19 0602  BP: 110/76 (!) 99/39 107/65 114/71  Pulse: (!) 116 (!) 105  (!) 119  Resp: 18 18  18   Temp: 98.8 F (37.1 C) 97.6 F (36.4 C)  100 F (37.8 C)  TempSrc: Oral Oral  Oral  SpO2: 97% 98%  96%  Weight:      Height:        Intake/Output Summary (Last 24 hours) at 06/23/2019 1115 Last data filed at 06/23/2019 0600 Gross per 24 hour  Intake 496.8 ml  Output 200 ml  Net 296.8 ml   Filed Weights   06/16/19 0110 06/16/19 0816  Weight: 49.9 kg 65.2 kg    Examination:  General exam: NAD Respiratory system: Decreased scattered coarse breath sounds.  No wheezing.  Fair air movement.  Speaking in full sentences.  Cardiovascular system: Tachycardia.  No JVD.  No lower extremity edema.  Gastrointestinal system: Abdomen is soft, nontender, nondistended, positive bowel sounds.  No rebound.  No guarding. Central nervous system: Alert and oriented. No focal neurological  deficits. Extremities: Left knee with dressing noted, no significant surrounding erythema.  Skin: No rashes, lesions or ulcers Psychiatry: Judgement and insight appear normal. Mood & affect appropriate.     Data Reviewed: I have personally reviewed following labs and imaging studies  CBC: Recent Labs  Lab 06/18/19 1032 06/18/19 1032 06/19/19 1051 06/20/19 1107 06/21/19 0143 06/22/19 0147 06/23/19 0831  WBC 17.3*   < > 19.0* 17.5* 17.6* 12.3* 12.5*  NEUTROABS 13.7*  --  15.4* 13.7*  --  8.8* 9.6*  HGB 10.6*   < > 9.6* 9.5* 9.9* 9.8* 9.5*  HCT 31.3*   < > 29.6* 29.6* 30.2* 31.0* 30.0*  MCV 82.6   < > 85.8 86.3 86.0 87.1 87.0  PLT 308   < > 331 401* 466* 495* 445*   < > = values in this interval not displayed.  Basic Metabolic Panel: Recent Labs  Lab 06/18/19 1032 06/18/19 1032 06/19/19 1051 06/20/19 1107 06/21/19 0143 06/22/19 0147 06/23/19 0831  NA 140   < > 142 139 137 139 139  K 3.4*   < > 3.5 3.4* 3.9 4.0 3.8  CL 109   < > 108 107 103 103 105  CO2 24   < > 26 28 27 28 26   GLUCOSE 111*   < > 100* 103* 121* 99 116*  BUN <5*   < > 6 7 9 10 10   CREATININE 0.46   < > 0.46 0.46 0.57 0.46 0.54  CALCIUM 7.4*   < > 7.8* 7.7* 7.9* 8.1* 8.1*  MG 2.0  --  2.0 1.8 2.0 2.0  --    < > = values in this interval not displayed.    GFR: Estimated Creatinine Clearance: 103.6 mL/min (by C-G formula based on SCr of 0.54 mg/dL).  Liver Function Tests: Recent Labs  Lab 06/17/19 1211  AST 18  ALT 12  ALKPHOS 61  BILITOT 0.8  PROT 6.4*  ALBUMIN 1.6*    CBG: No results for input(s): GLUCAP in the last 168 hours.   Recent Results (from the past 240 hour(s))  Culture, blood (routine x 2)     Status: Abnormal   Collection Time: 06/13/19 11:53 AM   Specimen: BLOOD LEFT HAND  Result Value Ref Range Status   Specimen Description   Final    BLOOD LEFT HAND Performed at [facility] [address], [place], [place] [address]    Special Requests    Final    BOTTLES DRAWN AEROBIC AND ANAEROBIC Blood Culture adequate volume Performed at [facility] [address] [place], [place] [address]    Culture  Setup Time   Final    GRAM POSITIVE COCCI IN CLUSTERS AEROBIC BOTTLE ONLY CRITICAL RESULT CALLED TO, READ BACK BY AND VERIFIED WITH: PHARMD C SHADE [id number] AT 82 AM BY CM    Culture (A)  Final    STAPHYLOCOCCUS AUREUS SUSCEPTIBILITIES PERFORMED ON PREVIOUS CULTURE WITHIN THE LAST 5 DAYS. Performed at [facility], [place] [address], [place], [place] [address]    Report Status 06/15/2019 FINAL  Final  Culture, blood (routine x 2)     Status: Abnormal   Collection Time: 06/13/19 11:53 AM   Specimen: BLOOD RIGHT HAND  Result Value Ref Range Status   Specimen Description   Final    BLOOD RIGHT HAND Performed at [facility] [address] [place], [place] [address]    Special Requests   Final    BOTTLES DRAWN AEROBIC AND ANAEROBIC Blood Culture results may not be optimal due to an inadequate volume of blood received in culture bottles Performed at [facility] [address], [place], [place] [address]    Culture  Setup Time   Final    GRAM POSITIVE COCCI IN CLUSTERS AEROBIC BOTTLE ONLY CRITICAL RESULT CALLED TO, READ BACK BY AND VERIFIED WITH: PHARMD C SHADE [id number] AT 36 AM BY CM    Culture (A)  Final    STAPHYLOCOCCUS AUREUS SUSCEPTIBILITIES PERFORMED ON PREVIOUS CULTURE WITHIN THE LAST 5 DAYS. Performed at [facility], [place] [address], [place], [place] [address]    Report Status 06/15/2019 FINAL  Final  Culture, blood (Routine X 2) w Reflex to ID Panel     Status: None   Collection Time: 06/16/19  4:33 PM  Specimen: BLOOD LEFT HAND  Result Value Ref Range Status   Specimen Description   Final    BLOOD LEFT HAND Performed at [facility] [address] [place], [place] [address]    Special Requests   Final    BOTTLES DRAWN  AEROBIC ONLY Blood Culture adequate volume Performed at [facility] [address] [place], [place] [address]    Culture   Final    NO GROWTH 5 DAYS Performed at [facility], [place] [address], [place], [place] [address]    Report Status 06/21/2019 FINAL  Final  Culture, blood (Routine X 2) w Reflex to ID Panel     Status: None   Collection Time: 06/16/19  4:40 PM   Specimen: BLOOD LEFT HAND  Result Value Ref Range Status   Specimen Description   Final    BLOOD LEFT HAND Performed at [facility] [address], [place], [place] [address]    Special Requests   Final    BOTTLES DRAWN AEROBIC ONLY Blood Culture results may not be optimal due to an inadequate volume of blood received in culture bottles Performed at [facility] [address] [place], [place] [address]    Culture   Final    NO GROWTH 5 DAYS Performed at [facility], [place] [address], [place], [place] [address]    Report Status 06/21/2019 FINAL  Final  Culture, Urine     Status: Abnormal   Collection Time: 06/21/19  1:05 PM   Specimen: Urine, Random  Result Value Ref Range Status   Specimen Description   Final    URINE, RANDOM Performed at [facility] [address] [place], [place] [address]    Special Requests   Final    NONE Performed at [facility], [place] [address], [place], [place] [address]    Culture (A)  Final    <10,000 COLONIES/mL INSIGNIFICANT GROWTH Performed at [facility] [address], [place], [place] [address]    Report Status 06/22/2019 FINAL  Final         Radiology Studies: CT CHEST WO CONTRAST  Result Date: 06/23/2019 CLINICAL DATA:  Follow-up endocarditis, bacteremia. Evaluate for empyema. EXAM: CT CHEST WITHOUT CONTRAST TECHNIQUE: Multidetector CT imaging of the chest was performed following the standard protocol without IV contrast. COMPARISON:  06/11/2019  FINDINGS: Cardiovascular: Heart is mildly enlarged. Small pericardial effusion. Aorta is normal caliber. Mediastinum/Nodes: Enlarged mediastinal lymph nodes, similar to prior study. AP window node has a short axis diameter of 10 mm. Precarinal lymph node has a short axis diameter of 11 mm. No axillary adenopathy. Lungs/Pleura: Numerous cavitary nodules and masses within the lungs as well as non cavitary nodules and masses and ground-glass opacities. Overall cavitation of many of the nodules have increased since prior study. Small bilateral pleural effusions, new since prior study. Upper Abdomen: Imaging into the upper abdomen shows no acute findings. Musculoskeletal: Chest wall soft tissues are unremarkable. No acute bony abnormality. IMPRESSION: Extensive cavitary and non cavitary nodules and masses throughout the lungs. Findings are similar to prior study. Degree of cavitation has increased since prior study. Small bilateral effusions. Mediastinal adenopathy. Borderline heart size.  Small pericardial effusion. Electronically Signed   By: Rolm Baptise M.D.   On: 06/23/2019 00:08        Scheduled Meds:  Chlorhexidine Gluconate Cloth  6 each Topical Daily   enoxaparin (LOVENOX) injection  40 mg Subcutaneous Q24H   methadone  35 mg Oral Daily   nicotine  21 mg Transdermal Daily   saccharomyces boulardii  250 mg Oral BID   sodium chloride flush  10-40 mL Intracatheter Q12H   Continuous Infusions:  nafcillin (NAFCIL) continuous infusion 20.8 mL/hr at 06/22/19 1800     LOS: 8 days    Time spent: 35 minutes    Irine Seal, MD [facility]   To contact the attending provider between 7A-7P or the covering provider during after hours 7P-7A, please log into the web site [email or web address] and access using universal [place] password for that web site. If you do not have the password, please call the hospital operator.  06/23/2019, 11:15 AM

## 2019-06-24 LAB — CBC WITH DIFFERENTIAL/PLATELET
Abs Immature Granulocytes: 0.05 10*3/uL (ref 0.00–0.07)
Basophils Absolute: 0.1 10*3/uL (ref 0.0–0.1)
Basophils Relative: 1 %
Eosinophils Absolute: 0.2 10*3/uL (ref 0.0–0.5)
Eosinophils Relative: 1 %
HCT: 30.8 % — ABNORMAL LOW (ref 36.0–46.0)
Hemoglobin: 9.7 g/dL — ABNORMAL LOW (ref 12.0–15.0)
Immature Granulocytes: 1 %
Lymphocytes Relative: 21 %
Lymphs Abs: 2.3 10*3/uL (ref 0.7–4.0)
MCH: 27.6 pg (ref 26.0–34.0)
MCHC: 31.5 g/dL (ref 30.0–36.0)
MCV: 87.7 fL (ref 80.0–100.0)
Monocytes Absolute: 0.9 10*3/uL (ref 0.1–1.0)
Monocytes Relative: 8 %
Neutro Abs: 7.6 10*3/uL (ref 1.7–7.7)
Neutrophils Relative %: 68 %
Platelets: 460 10*3/uL — ABNORMAL HIGH (ref 150–400)
RBC: 3.51 MIL/uL — ABNORMAL LOW (ref 3.87–5.11)
RDW: 16.2 % — ABNORMAL HIGH (ref 11.5–15.5)
WBC: 11.1 10*3/uL — ABNORMAL HIGH (ref 4.0–10.5)
nRBC: 0 % (ref 0.0–0.2)

## 2019-06-24 LAB — BASIC METABOLIC PANEL
Anion gap: 9 (ref 5–15)
BUN: 11 mg/dL (ref 6–20)
CO2: 26 mmol/L (ref 22–32)
Calcium: 8.2 mg/dL — ABNORMAL LOW (ref 8.9–10.3)
Chloride: 106 mmol/L (ref 98–111)
Creatinine, Ser: 0.51 mg/dL (ref 0.44–1.00)
GFR calc Af Amer: >60 mL/min (ref >60)
GFR calc non Af Amer: >60 mL/min (ref >60)
Glucose, Bld: 100 mg/dL — ABNORMAL HIGH (ref 70–99)
Potassium: 3.5 mmol/L (ref 3.5–5.1)
Sodium: 141 mmol/L (ref 135–145)

## 2019-06-24 MED ORDER — POTASSIUM CHLORIDE CRYS ER 20 MEQ PO TBCR
40.0000 meq | EXTENDED_RELEASE_TABLET | Freq: Once | ORAL | Status: AC
  Administered 2019-06-24: 40 meq via ORAL
  Filled 2019-06-24: qty 2

## 2019-06-24 MED ORDER — BENZONATATE 100 MG PO CAPS
200.0000 mg | ORAL_CAPSULE | Freq: Three times a day (TID) | ORAL | Status: DC | PRN
  Administered 2019-06-24 – 2019-06-29 (×6): 200 mg via ORAL
  Filled 2019-06-24 (×6): qty 2

## 2019-06-24 NOTE — Progress Notes (Signed)
CHL NOTE REDACTION - DIFFERENT PATIENT  Due to an Incorrect Registration or Identity Theft, we are redacting this note. In these particular instances; both names are NOT the same patient.  We have changed the demographic information to reflect those of the correct patient.  Please review for accuracy and ? Mark Complt the note.  Please do not refresh the body of the note.   Should the change in demographics impact clinical decisions that had been made at the time; You may choose to Edit Note from our Note Redaction.  This will create an [place] [employer] Identity Application Analyst [id number]) [phone] [email or web address]

## 2019-06-24 NOTE — Progress Notes (Addendum)
Pharmacy Antibiotic Note  Michelle Doyle is a 29 y.o. female admitted on 06/10/2019 & readmitted 06/24/2019  with disseiminated MSSA bacteremia complicated by both TV and MV endocarditis with evidence of pulmonary septic emboli, left knee septic arthritis s/p arthrotomy, splenic infarct. Pharmacy has been consulted for Nafcillin dosing.  Today, 06/24/2019: Continues to spike occasional fevers to <102; APAP PRN; afebrile since yesterday WBC remain elevated but overall improved SCr stable wnl Repeat CT chest 4/19 shows worsening cavitary nodules in lungs with small bilateral effusions, and improved pericardial effusion 1st clear BCx on 4/13  Plan: Continue Nafcillin 12 grams IV q24h continuous infusion Plan 6 week inpatient course per ID recommendations (thru 5/24 based on 1st clear BCx 4/13) Nafcillin associated with risk of hypokalemia, cholestasis, and transaminitis; recommend Bmet at least every 72 hr and Cmet weekly Potassium replacement added to protocol, will replace potassium with 40 mEq PO x 1 today Pharmacy will sign off of scheduled note-writing, following peripherally for the above, as well as renal adjustments or changes in clinical status  Height: 5\' 7"  (170.2 cm) Weight: 65.2 kg (143 lb 11.8 oz) IBW/kg (Calculated) : 61.6  Temp (24hrs), Avg:98.9 F (37.2 C), Min:98.7 F (37.1 C), Max:99.3 F (37.4 C)  Recent Labs  Lab 06/20/19 1107 06/21/19 0143 06/22/19 0147 06/23/19 0831 06/24/19 0403  WBC 17.5* 17.6* 12.3* 12.5* 11.1*  CREATININE 0.46 0.57 0.46 0.54 0.51    Estimated Creatinine Clearance: 103.6 mL/min (by C-G formula based on SCr of 0.51 mg/dL).    No Known Allergies  Antimicrobials this admission:  Vancomycin 06/11/2019 >> 4/8 Cefepime 06/11/2019 >> 4/8 Cefazolin 4/8 >> 4/9 Nafcillin 4/9 >>  Microbiology results:  4/8 BCx: MSSA  4/8 UCx: >100K [place] sp, 10K MSSA 4/8 COVID/Flu neg 4/8 L knee bedside aspiration: ngF, 21k WBC 4/8 L knee synovial fluid/tissue  from OR: both with MSSA 4/8 HIV NR 4/10 BCx: MSSA  4/11 Hep A/B NR, Hep C reactive 4/13 BCx: NGF 4/18 UCx: insig growth  Thank you for allowing pharmacy to be a part of this patient's care.   Reuel Boom, PharmD, BCPS [phone] 06/24/2019, 8:44 AM

## 2019-06-24 NOTE — Plan of Care (Signed)
Patient's heart was elevated 130-140's when entering room patient states that she is on the phone with her probation officer but she is fine and doesn't need anything at this time. Will continue to monitor.  Problem: Clinical Measurements: Goal: Ability to maintain clinical measurements within normal limits will improve Outcome: Progressing Goal: Will remain free from infection Outcome: Progressing Goal: Diagnostic test results will improve Outcome: Progressing Goal: Respiratory complications will improve Outcome: Progressing Goal: Cardiovascular complication will be avoided Outcome: Progressing   Problem: Coping: Goal: Level of anxiety will decrease Outcome: Progressing   Problem: Elimination: Goal: Will not experience complications related to bowel motility Outcome: Progressing Goal: Will not experience complications related to urinary retention Outcome: Progressing   Problem: Pain Managment: Goal: General experience of comfort will improve Outcome: Progressing   Problem: Safety: Goal: Ability to remain free from injury will improve Outcome: Progressing   Problem: Skin Integrity: Goal: Risk for impaired skin integrity will decrease Outcome: Progressing

## 2019-06-25 NOTE — Progress Notes (Signed)
CHL NOTE REDACTION - DIFFERENT PATIENT  Due to an Incorrect Registration or Identity Theft, we are redacting this note. In these particular instances; both names are NOT the same patient.  We have changed the demographic information to reflect those of the correct patient.  Please review for accuracy and ? Mark Complt the note.  Please do not refresh the body of the note.   Should the change in demographics impact clinical decisions that had been made at the time; You may choose to Edit Note from our Note Redaction.  This will create an [place] [employer] Identity Application Analyst [id number]) [phone] [email or web address]

## 2019-06-25 NOTE — TOC Progression Note (Addendum)
Transition of Care [facility]) - Progression Note    Patient Details  Name: Michelle Doyle MRN: [id number] Date of Birth: 25-Aug-1990  [facility]) CM/SW Contact  Shade Flood, LCSW Phone Number:  06/25/2019, 10:30 AM  Clinical Narrative:     TOC following. Pt status discussed in Progression with MD. Updated MD on new program for pt's with IV drug use history who need PICC line and antibiotic therapy for extended time period. Program involves discharging pt with a PICC with a locking device and pt would need to come to [facility] as an outpatient for medication administration. MD to discuss with ID. MD states pt is not medically ready for dc as she needs follow up from Orthopedics and she may need further imaging studies.  TOC will follow and assist as needed.       Expected Discharge Plan and Services                                                 Social Determinants of Health (SDOH) Interventions    Readmission Risk Interventions No flowsheet data found.

## 2019-06-25 NOTE — Progress Notes (Signed)
Subjective:  Patient reports pain as mild to moderate.  Denies N/V/CP/SOB.  Objective:   VITALS:   Vitals:   06/25/19 0857 06/25/19 1252 06/25/19 1312 06/25/19 1533  BP: 120/79 118/77 116/83   Pulse:  (!) 121 (!) 120 (!) 112  Resp:  19 16   Temp:   98.8 F (37.1 C)   TempSrc:  Oral Oral   SpO2:  100% 100%   Weight:      Height:        NAD ABD soft Sensation intact distally Intact pulses distally Dorsiflexion/Plantar flexion intact Incision: no drainage Compartment soft  Staples intact   Lab Results  Component Value Date   WBC 11.1 (H) 06/24/2019   HGB 9.7 (L) 06/24/2019   HCT 30.8 (L) 06/24/2019   MCV 87.7 06/24/2019   PLT 460 (H) 06/24/2019   BMET    Component Value Date/Time   NA 141 06/24/2019 0403   K 3.5 06/24/2019 0403   CL 106 06/24/2019 0403   CO2 26 06/24/2019 0403   GLUCOSE 100 (H) 06/24/2019 0403   BUN 11 06/24/2019 0403   CREATININE 0.51 06/24/2019 0403   CALCIUM 8.2 (L) 06/24/2019 0403   GFRNONAA >60 06/24/2019 0403   GFRAA >60 06/24/2019 0403    Recent Results (from the past 240 hour(s))  Culture, blood (Routine X 2) w Reflex to ID Panel     Status: None   Collection Time: 06/16/19  4:33 PM   Specimen: BLOOD LEFT HAND  Result Value Ref Range Status   Specimen Description   Final    BLOOD LEFT HAND Performed at [facility], [place] [address], [place], [place] [address]    Special Requests   Final    BOTTLES DRAWN AEROBIC ONLY Blood Culture adequate volume Performed at [facility] [address] [place], [place] [address]    Culture   Final    NO GROWTH 5 DAYS Performed at [facility], [place] [address], [place], [place] [address]    Report Status 06/21/2019 FINAL  Final  Culture, blood (Routine X 2) w Reflex to ID Panel     Status: None   Collection Time: 06/16/19  4:40 PM   Specimen: BLOOD LEFT HAND  Result Value Ref Range Status   Specimen Description   Final    BLOOD LEFT HAND  Performed at [facility] [address] [place], [place] [address]    Special Requests   Final    BOTTLES DRAWN AEROBIC ONLY Blood Culture results may not be optimal due to an inadequate volume of blood received in culture bottles Performed at [facility] [address], [place], [place] [address]    Culture   Final    NO GROWTH 5 DAYS Performed at [facility], [place] [address] [place], [place] [address]    Report Status 06/21/2019 FINAL  Final  Culture, Urine     Status: Abnormal   Collection Time: 06/21/19  1:05 PM   Specimen: Urine, Random  Result Value Ref Range Status   Specimen Description   Final    URINE, RANDOM Performed at [facility] [address], [place], [place] [address]    Special Requests   Final    NONE Performed at [facility], [place] [address] [place], [place] [address]    Culture (A)  Final    <10,000 COLONIES/mL INSIGNIFICANT GROWTH Performed at [facility], [place] [address] [place], [place] [address]    Report Status 06/22/2019 FINAL  Final      Assessment/Plan:     Active Problems:   Endocarditis   Hypokalemia   Septic arthritis ([place])   Splenic infarct   Bacteremia due to methicillin susceptible Staphylococcus aureus (MSSA)   IV drug abuse ([place])   Tobacco abuse   Positive hepatitis C antibody test  MSSA bacteremia MSSA L knee septic arthritis  WBAT with walker DVT ppx: Lovenox, SCDs, TEDS PO pain control PT/OT IV abx per ID Will need 6 weeks IV abx Opiate withdrawal per medical team  Dispo: RN to d/c staples - she refused me removing them, f/u with me in 4 weeks after d/c   Bertram Savin 06/25/2019, 3:49 PM   Rod Can, MD [phone] [place] [facility] now [facility]  [facility] [address], [place], [place], [place] [address] Phone: [phone] [email or web address] Facebook  [employer]

## 2019-06-25 NOTE — Progress Notes (Addendum)
    [place] for Infectious Disease    Date of Admission:  06/15/2019      ID: Michelle Doyle is a 29 y.o. female with  MSSA disseminated disease with  Bacteremia, endocarditis, pulmonary septic emboli, splenic emboli and MSSA L knee septic arthritis -bacteremia cleared on 4/13 -- day 10 Active Problems:   Endocarditis   Hypokalemia   Septic arthritis (HCC)   Splenic infarct   Bacteremia due to methicillin susceptible Staphylococcus aureus (MSSA)   IV drug abuse ([place])   Tobacco abuse   Positive hepatitis C antibody test    Subjective: Afebrile, feeling better, less left knee pain. She denies chest pain. Interested in getting home. She would be able to go back home to live with her mother, and sister. She has 3 young children under the age of 7  Medications:   Chlorhexidine Gluconate Cloth  6 each Topical Daily   enoxaparin (LOVENOX) injection  40 mg Subcutaneous Q24H   methadone  35 mg Oral Daily   nicotine  21 mg Transdermal Daily   saccharomyces boulardii  250 mg Oral BID    Objective: Vital signs in last 24 hours: Temp:  [98.3 F (36.8 C)-99.8 F (37.7 C)] 99.8 F (37.7 C) (04/22 1723) Pulse Rate:  [101-126] 123 (04/22 1723) Resp:  [16-19] 18 (04/22 1723) BP: (100-120)/(60-83) 115/73 (04/22 1723) SpO2:  [99 %-100 %] 100 % (04/22 1723)  Physical Exam  Constitutional:  oriented to person, place, and time. appears well-developed and well-nourished. No distress.  HENT: [place]/AT, PERRLA, no scleral icterus Mouth/Throat: Oropharynx is clear and moist. No oropharyngeal exudate.  Cardiovascular: Normal rate, regular rhythm and normal heart sounds. Exam reveals no gallop and no friction rub.  No murmur heard.  Pulmonary/Chest: Effort normal and breath sounds normal. No respiratory distress.  has no wheezes.  Neck = supple, no nuchal rigidity Abdominal: Soft. Bowel sounds are normal.  exhibits no distension. There is no tenderness.  Lymphadenopathy: no cervical adenopathy.  No axillary adenopathy Neurological: alert and oriented to person, place, and time.  Skin: Skin is warm and dry. No rash noted. No erythema.  Psychiatric: a normal mood and affect.  behavior is normal.    Lab Results Recent Labs    06/23/19 0831 06/24/19 0403  WBC 12.5* 11.1*  HGB 9.5* 9.7*  HCT 30.0* 30.8*  NA 139 141  K 3.8 3.5  CL 105 106  CO2 26 26  BUN 10 11  CREATININE 0.54 0.51    Microbiology: 4/13 blood cx NGTD Studies/Results: No results found.   Assessment/Plan:  currently day 10 since clearing bacteremia. Using 4/13 as day 1. Recommend that we re-evaluate on Monday, day 14, if can consider to be part of getting daily infusion via picc line, for management of endocarditis Vs if she is willing to stay through 4 wk, then finish with outpatient IV management  Opiate dependence = continue on methadone 35mg  daily  Will d/c probiotics [facility] for Infectious Diseases Cell: [phone] Pager: [phone]  06/25/2019, 5:34 PM

## 2019-06-25 NOTE — Progress Notes (Signed)
   06/25/19 1312  Assess: MEWS Score  Temp 98.8 F (37.1 C)  BP 116/83  Pulse Rate (!) 120  Resp 16  Level of Consciousness Alert  SpO2 100 %  O2 Device Room Air  Assess: MEWS Score  MEWS Temp 0  MEWS Systolic 0  MEWS Pulse 2  MEWS RR 0  MEWS LOC 0  MEWS Score 2  MEWS Score Color Yellow  Assess: if the MEWS score is Yellow or Red  Were vital signs taken at a resting state? Yes  Focused Assessment Documented focused assessment  Early Detection of Sepsis Score *See Row Information* Low  MEWS guidelines implemented *See Row Information* No, other (Comment) (has been yellow this admission DT: HR, routine VS, MD aware)

## 2019-06-26 LAB — BASIC METABOLIC PANEL
Anion gap: 7 (ref 5–15)
BUN: 9 mg/dL (ref 6–20)
CO2: 26 mmol/L (ref 22–32)
Calcium: 8.2 mg/dL — ABNORMAL LOW (ref 8.9–10.3)
Chloride: 109 mmol/L (ref 98–111)
Creatinine, Ser: 0.46 mg/dL (ref 0.44–1.00)
GFR calc Af Amer: >60 mL/min (ref >60)
GFR calc non Af Amer: >60 mL/min (ref >60)
Glucose, Bld: 92 mg/dL (ref 70–99)
Potassium: 3.7 mmol/L (ref 3.5–5.1)
Sodium: 142 mmol/L (ref 135–145)

## 2019-06-26 LAB — CBC WITH DIFFERENTIAL/PLATELET
Abs Immature Granulocytes: 0.04 10*3/uL (ref 0.00–0.07)
Basophils Absolute: 0.1 10*3/uL (ref 0.0–0.1)
Basophils Relative: 1 %
Eosinophils Absolute: 0.1 10*3/uL (ref 0.0–0.5)
Eosinophils Relative: 2 %
HCT: 28.3 % — ABNORMAL LOW (ref 36.0–46.0)
Hemoglobin: 9 g/dL — ABNORMAL LOW (ref 12.0–15.0)
Immature Granulocytes: 0 %
Lymphocytes Relative: 21 %
Lymphs Abs: 2 10*3/uL (ref 0.7–4.0)
MCH: 28 pg (ref 26.0–34.0)
MCHC: 31.8 g/dL (ref 30.0–36.0)
MCV: 87.9 fL (ref 80.0–100.0)
Monocytes Absolute: 0.6 10*3/uL (ref 0.1–1.0)
Monocytes Relative: 7 %
Neutro Abs: 6.4 10*3/uL (ref 1.7–7.7)
Neutrophils Relative %: 69 %
Platelets: 436 10*3/uL — ABNORMAL HIGH (ref 150–400)
RBC: 3.22 MIL/uL — ABNORMAL LOW (ref 3.87–5.11)
RDW: 16.2 % — ABNORMAL HIGH (ref 11.5–15.5)
WBC: 9.2 10*3/uL (ref 4.0–10.5)
nRBC: 0 % (ref 0.0–0.2)

## 2019-06-26 MED ORDER — FLUCONAZOLE 100 MG PO TABS
200.0000 mg | ORAL_TABLET | Freq: Once | ORAL | Status: AC
  Administered 2019-06-26: 200 mg via ORAL
  Filled 2019-06-26: qty 2

## 2019-06-26 MED ORDER — METHADONE HCL 10 MG PO TABS
40.0000 mg | ORAL_TABLET | Freq: Every day | ORAL | Status: DC
  Administered 2019-06-27 – 2019-06-30 (×4): 40 mg via ORAL
  Filled 2019-06-26 (×5): qty 4

## 2019-06-26 NOTE — Progress Notes (Signed)
   06/26/19 1339  Assess: MEWS Score  Temp 98.8 F (37.1 C)  BP 116/72  Pulse Rate (!) 122  Resp 20  SpO2 100 %  Assess: MEWS Score  MEWS Temp 0  MEWS Systolic 0  MEWS Pulse 2  MEWS RR 0  MEWS LOC 0  MEWS Score 2  MEWS Score Color Yellow  Assess: if the MEWS score is Yellow or Red  Were vital signs taken at a resting state? Yes  Focused Assessment Documented focused assessment  Early Detection of Sepsis Score *See Row Information* Low  MEWS guidelines implemented *See Row Information* No, other (Comment) (has been yellow MEWS most of admit, routine VS, MD aware )

## 2019-06-26 NOTE — Progress Notes (Signed)
Pt. Refused blood draws at this time, and told this Nurse to come back at 0900am. Primary Nurse at bedside and talked to the pt. About the lab works scheduled st this time. Pt. Still REFUSED. To endorsed to day shift  [employer] nurse.

## 2019-06-26 NOTE — Progress Notes (Signed)
Patient refused lab draw at this time, want's it done at [facility]

## 2019-06-26 NOTE — Progress Notes (Addendum)
    [place] for Infectious Disease    Date of Admission:  06/15/2019   Total days of antibiotics -since 4/7         ID: Michelle Doyle is a 29 y.o. female with  Disseminated MSSA disease, endocarditis, septic pulmonary emboli, left knee septic arthritis s/p wash out Active Problems:   Endocarditis   Hypokalemia   Septic arthritis (HCC)   Splenic infarct   Bacteremia due to methicillin susceptible Staphylococcus aureus (MSSA)   IV drug abuse ([place])   Tobacco abuse   Positive hepatitis C antibody test    Subjective: Afebrile, only woke up once last night. Feels like she is having yeast infection  Medications:  . Chlorhexidine Gluconate Cloth  6 each Topical Daily  . enoxaparin (LOVENOX) injection  40 mg Subcutaneous Q24H  . [START ON 06/27/2019] methadone  40 mg Oral Daily  . nicotine  21 mg Transdermal Daily    Objective: Vital signs in last 24 hours: Temp:  [98 F (36.7 C)-99.8 F (37.7 C)] 98.8 F (37.1 C) (04/23 1339) Pulse Rate:  [95-126] 122 (04/23 1339) Resp:  [16-20] 20 (04/23 1339) BP: (99-116)/(56-87) 116/72 (04/23 1339) SpO2:  [100 %] 100 % (04/23 1339) Physical Exam  Constitutional:  oriented to person, place, and time. appears well-developed and well-nourished. No distress.  HENT: [place]/AT, PERRLA, no scleral icterus Mouth/Throat: Oropharynx is clear and moist. No oropharyngeal exudate.  Cardiovascular: Normal rate, regular rhythm and normal heart sounds. Exam reveals no gallop and no friction rub.  No murmur heard.  Pulmonary/Chest: Effort normal and breath sounds normal. No respiratory distress.  has no wheezes.  Ext: right arm picc line in place Abdominal: Soft. Bowel sounds are normal.  exhibits no distension. There is no tenderness.  Lymphadenopathy: no cervical adenopathy. No axillary adenopathy Neurological: alert and oriented to person, place, and time.  Skin: Skin is warm and dry. No rash noted. No erythema.  Psychiatric: a normal mood and affect.   behavior is normal.    Lab Results Recent Labs    06/24/19 0403 06/26/19 0915  WBC 11.1* 9.2  HGB 9.7* 9.0*  HCT 30.8* 28.3*  NA 141 142  K 3.5 3.7  CL 106 109  CO2 26 26  BUN 11 9  CREATININE 0.51 0.46   Lab Results  Component Value Date   ESRSEDRATE 63 (H) 06/14/2019    Microbiology: 4/13 blood cx ngtd Studies/Results: No results found.   Assessment/Plan: Disseminated mssa infection including endocarditis = recommend to continue with nafcillin for the time being. Since she is clinically stable, can move towards checking labs Q3-4 days so that it minimize blood loss, worsening anemia  Thus far needs to complete course of 6 wk. Potentially she could be candidate for home IV therapy if we discuss her needs for iv therapy, staying in stable environment, if she can come to hospital daily to get infusion  Pulmonary cavitary lesions/septic emboli = treated with nafcillin  Vaginal yeast infection = will treat with fluconazole 200mg  x 1 and see if need to retreat  Chronic pain = currently increase dose up to 40mg  to see if better pain management so that she doesn't need additional medicaiton  Will see her back on [place] for Infectious Diseases Cell: [phone] Pager: [phone]  06/26/2019, 3:40 PM

## 2019-06-26 NOTE — Progress Notes (Signed)
CHL NOTE REDACTION - DIFFERENT PATIENT  Due to an Incorrect Registration or Identity Theft, we are redacting this note. In these particular instances; both names are NOT the same patient.  We have changed the demographic information to reflect those of the correct patient.  Please review for accuracy and ? Mark Complt the note.  Please do not refresh the body of the note.   Should the change in demographics impact clinical decisions that had been made at the time; You may choose to Edit Note from our Note Redaction.  This will create an [place] [employer] Identity Application Analyst [id number]) [phone] [email or web address] [email or web address]

## 2019-06-27 NOTE — Evaluation (Addendum)
 Physical Therapy Evaluation Patient Details Name: Michelle Doyle MRN: [id number]  DOB: [birth year] Today's Date: 06/27/2019     History of Present Illness   Michelle Doyle. female with  Disseminated MSSA disease, endocarditis, septic pulmonary emboli, left knee septic arthritis s/p wash out on 06/11/19.  Clinical Impression   Pt admitted with above diagnosis. Pt currently with functional limitations due to the deficits listed below (see PT Problem List). Pt will benefit from skilled PT to increase their independence and safety with mobility to allow discharge to the venue listed below.  Pt instructed in knee ROM and strengthening exercises as she has not been moving her knee because she was nervous to move it. She was given illustrated HEP for exercises. She is independent in her room with mobility.  Pt may only need 1 or 2 more visits, to make sure she is doing her exercises properly and ambulate longer distance. No PT at time of d/c needed.      Follow Up Recommendations No PT follow up      Equipment Recommendations   None recommended by PT     Recommendations for Other Services         Precautions / Restrictions Precautions Precautions: None Restrictions Weight Bearing Restrictions: Yes LLE Weight Bearing: Weight bearing as tolerated         Mobility   Bed Mobility Overal bed mobility: Independent   Transfers Overall transfer level: Independent   Ambulation/Gait Ambulation/Gait assistance: Supervision Gait Distance (Feet): 40 Feet Assistive device: IV Pole Gait Pattern/deviations: Decreased step length - right;Decreased stance time - left General Gait Details: Pt ambulated around the room with IV pole and some hesitancy with L LE WB, but no LOB or buckeling   Stairs   Wheelchair Mobility   Modified Rankin (Stroke Patients Only)        Balance         Pertinent Vitals/Pain Pain Assessment: 0-10 Pain Score: 4  Pain Location: L knee with exercises Pain Descriptors  / Indicators: Grimacing Pain Intervention(s): Limited activity within patient's tolerance;Monitored during session      Home Living Family/patient expects to be discharged to:: Private residence Living Arrangements: Other relatives Available Help at Discharge: Family Type of Home: Apartment Home Access: Elevator Home Layout: One level Home Equipment: None Additional Comments: Plan is to go to her grandfather's house     Prior Function Level of Independence: Independent               Hand Dominance        Extremity/Trunk Assessment       Lower Extremity Assessment Lower Extremity Assessment: LLE deficits/detail LLE Deficits / Details: knee AROM 0-90 with grimacing with flexion    Communication   Communication: No difficulties  Cognition Arousal/Alertness: Awake/alert Behavior During Therapy: WFL for tasks assessed/performed Overall Cognitive Status: Within Functional Limits for tasks assessed      General Comments      Exercises General Exercises - Lower Extremity Quad Sets: Left;Strengthening;10 reps;Supine Short Arc Quad: Strengthening;Left;10 reps;Supine Heel Slides: AROM;Left;10 reps Hip ABduction/ADduction: AROM;Left;10 reps Straight Leg Raises: Strengthening;Left;10 reps    Assessment/Plan      PT Assessment Patient needs continued PT services  PT Problem List Decreased range of motion;Decreased strength;Decreased mobility          PT Treatment Interventions Gait training;Therapeutic exercise;Therapeutic activities;Patient/family education     PT Goals (Current goals can be found in the Care Plan section)   Acute Rehab PT Goals Patient Stated Goal:  go to her grandfather's at time of d/c PT Goal Formulation: With patient Time For Goal Achievement: 07/04/19 Potential to Achieve Goals: Good      Frequency Min 2X/week    Barriers to discharge       Co-evaluation        AM-PAC PT "6 Clicks" Mobility  Outcome Measure Help needed turning from your  back to your side while in a flat bed without using bedrails?: None Help needed moving from lying on your back to sitting on the side of a flat bed without using bedrails?: None Help needed moving to and from a bed to a chair (including a wheelchair)?: None Help needed standing up from a chair using your arms (e.g., wheelchair or bedside chair)?: None Help needed to walk in hospital room?: None Help needed climbing 3-5 steps with a railing? : None 6 Click Score: 24      End of Session   Activity Tolerance: Patient tolerated treatment well Patient left: in bed;with call bell/phone within reach Nurse Communication: Mobility status PT Visit Diagnosis: Difficulty in walking, not elsewhere classified (R26.2)      Time: 4098-1191 PT Time Calculation (min) (ACUTE ONLY): 27 min     Charges:             PT Evaluation $PT Eval Low Complexity: 1 Low PT Treatments $Therapeutic Exercise: 8-22 mins             [name], [name] Pager [phone] 06/27/2019     [name] 06/27/2019, 12:51 PM

## 2019-06-27 NOTE — Progress Notes (Signed)
CHL NOTE REDACTION - DIFFERENT PATIENT  Due to an Incorrect Registration or Identity Theft, we are redacting this note. In these particular instances; both names are NOT the same patient.  We have changed the demographic information to reflect those of the correct patient.  Please review for accuracy and ? Mark Complt the note.  Please do not refresh the body of the note.   Should the change in demographics impact clinical decisions that had been made at the time; You may choose to Edit Note from our Note Redaction.  This will create an [place] [employer] Identity Application Analyst ([id number]) [phone] - [email or web address] [email or web address]

## 2019-06-27 NOTE — Progress Notes (Signed)
   06/27/19 2054  Assess: MEWS Score  Temp 98.7 F (37.1 C)  BP 113/74  Pulse Rate (!) 121  Resp 20  SpO2 100 %  O2 Device Room Air  Assess: MEWS Score  MEWS Temp 0  MEWS Systolic 0  MEWS Pulse 2  MEWS RR 0  MEWS LOC 0  MEWS Score 2  MEWS Score Color Yellow  Assess: if the MEWS score is Yellow or Red  Were vital signs taken at a resting state? Yes  Focused Assessment Documented focused assessment  Early Detection of Sepsis Score *See Row Information* Low  MEWS guidelines implemented *See Row Information* No, vital signs rechecked  Treat  MEWS Interventions Other (Comment)  Notify: Charge Nurse/RN  Name of Charge Nurse/RN Notified Eugenie Norrie  Date Charge Nurse/RN Notified 06/27/19  Time Charge Nurse/RN Notified 2338  Document  Patient Outcome Other (Comment) (pt stable. States that her pulse is usually between 110-130)  Progress note created (see row info) Yes   Assessed pt, pt stable. Pt states that it is normal for her pulse to run as high as 110-130. Charge nurse made aware. Will continue to assess and monitor the pt for changes.

## 2019-06-27 NOTE — Progress Notes (Signed)
   06/27/19 1355  Assess: MEWS Score  Temp 98.5 F (36.9 C)  BP 119/86  Pulse Rate (!) 115  Resp 16  SpO2 98 %  O2 Device Room Air  Assess: MEWS Score  MEWS Temp 0  MEWS Systolic 0  MEWS Pulse 2  MEWS RR 0  MEWS LOC 0  MEWS Score 2  MEWS Score Color Yellow  Assess: if the MEWS score is Yellow or Red  Were vital signs taken at a resting state? Yes  Focused Assessment Documented focused assessment  Early Detection of Sepsis Score *See Row Information* Low  MEWS guidelines implemented *See Row Information* No, vital signs rechecked  Patient on the phone talking to an employee about "the treatment of my grandfather". Very upset and arguing on the phone. Attempted to calm her with no luck. Md notified of MEWS change. Continue to monitor. Eulas Post, RN

## 2019-06-28 ENCOUNTER — Inpatient Hospital Stay (HOSPITAL_COMMUNITY): Payer: Self-pay

## 2019-06-28 NOTE — Progress Notes (Signed)
CHL NOTE REDACTION - DIFFERENT PATIENT  Due to an Incorrect Registration or Identity Theft, we are redacting this note. In these particular instances; both names are NOT the same patient.  We have changed the demographic information to reflect those of the correct patient.  Please review for accuracy and ? Mark Complt the note.  Please do not refresh the body of the note.   Should the change in demographics impact clinical decisions that had been made at the time; You may choose to Edit Note from our Note Redaction.  This will create an [place] [employer] Identity Application Analyst ([id number]) [phone] [email or web address] [email or web address]

## 2019-06-29 LAB — CBC WITH DIFFERENTIAL/PLATELET
Abs Immature Granulocytes: 0.04 10*3/uL (ref 0.00–0.07)
Basophils Absolute: 0.1 10*3/uL (ref 0.0–0.1)
Basophils Relative: 1 %
Eosinophils Absolute: 0.2 10*3/uL (ref 0.0–0.5)
Eosinophils Relative: 2 %
HCT: 27.1 % — ABNORMAL LOW (ref 36.0–46.0)
Hemoglobin: 8.5 g/dL — ABNORMAL LOW (ref 12.0–15.0)
Immature Granulocytes: 1 %
Lymphocytes Relative: 27 %
Lymphs Abs: 2.2 10*3/uL (ref 0.7–4.0)
MCH: 27.6 pg (ref 26.0–34.0)
MCHC: 31.4 g/dL (ref 30.0–36.0)
MCV: 88 fL (ref 80.0–100.0)
Monocytes Absolute: 0.6 10*3/uL (ref 0.1–1.0)
Monocytes Relative: 8 %
Neutro Abs: 4.9 10*3/uL (ref 1.7–7.7)
Neutrophils Relative %: 61 %
Platelets: 395 10*3/uL (ref 150–400)
RBC: 3.08 MIL/uL — ABNORMAL LOW (ref 3.87–5.11)
RDW: 16.4 % — ABNORMAL HIGH (ref 11.5–15.5)
WBC: 8 10*3/uL (ref 4.0–10.5)
nRBC: 0 % (ref 0.0–0.2)

## 2019-06-29 LAB — COMPREHENSIVE METABOLIC PANEL
ALT: 7 U/L (ref 0–44)
AST: 8 U/L — ABNORMAL LOW (ref 15–41)
Albumin: 1.8 g/dL — ABNORMAL LOW (ref 3.5–5.0)
Alkaline Phosphatase: 68 U/L (ref 38–126)
Anion gap: 7 (ref 5–15)
BUN: 10 mg/dL (ref 6–20)
CO2: 27 mmol/L (ref 22–32)
Calcium: 8.2 mg/dL — ABNORMAL LOW (ref 8.9–10.3)
Chloride: 106 mmol/L (ref 98–111)
Creatinine, Ser: 0.51 mg/dL (ref 0.44–1.00)
GFR calc Af Amer: >60 mL/min (ref >60)
GFR calc non Af Amer: >60 mL/min (ref >60)
Glucose, Bld: 107 mg/dL — ABNORMAL HIGH (ref 70–99)
Potassium: 3.6 mmol/L (ref 3.5–5.1)
Sodium: 140 mmol/L (ref 135–145)
Total Bilirubin: 0.8 mg/dL (ref 0.3–1.2)
Total Protein: 7.3 g/dL (ref 6.5–8.1)

## 2019-06-29 MED ORDER — HYDROXYZINE HCL 10 MG PO TABS
10.0000 mg | ORAL_TABLET | Freq: Three times a day (TID) | ORAL | Status: DC | PRN
  Administered 2019-06-29: 10 mg via ORAL
  Filled 2019-06-29: qty 1

## 2019-06-29 MED ORDER — MORPHINE SULFATE (PF) 2 MG/ML IV SOLN
1.0000 mg | Freq: Three times a day (TID) | INTRAVENOUS | Status: DC | PRN
  Administered 2019-06-29 – 2019-06-30 (×3): 1 mg via INTRAVENOUS
  Filled 2019-06-29 (×3): qty 1

## 2019-06-29 MED ORDER — METHOCARBAMOL 500 MG PO TABS
500.0000 mg | ORAL_TABLET | Freq: Three times a day (TID) | ORAL | Status: DC | PRN
  Administered 2019-06-29: 500 mg via ORAL
  Filled 2019-06-29: qty 1

## 2019-06-29 NOTE — Progress Notes (Addendum)
Physical Therapy Treatment and Discharge from Acute PT Patient Details Name: Michelle Doyle MRN: [id number] DOB: September 07, 1990 Today's Date: 06/29/2019    History of Present Illness Michelle Doyle is a 29 y.o. female with  Disseminated MSSA disease, endocarditis, septic pulmonary emboli, left knee septic arthritis s/p wash out on 06/11/19.    PT Comments    Pt performed each exercise on HEP handout once to ensure correct technique.  Pt also ambulated in hallway and reports improvement in pain since admission.  Pt has met goals and agreeable to continue ambulating and performing exercises on her own.  PT to sign off.    Follow Up Recommendations  No PT follow up     Equipment Recommendations  None recommended by PT    Recommendations for Other Services       Precautions / Restrictions Precautions Precautions: None Restrictions LLE Weight Bearing: Weight bearing as tolerated    Mobility  Bed Mobility Overal bed mobility: Independent                Transfers Overall transfer level: Independent                  Ambulation/Gait Ambulation/Gait assistance: Modified independent (Device/Increase time) Gait Distance (Feet): 400 Feet Assistive device: IV Pole Gait Pattern/deviations: Step-through pattern;Decreased stride length     General Gait Details: able to ambulate without holding IV pole however pushed IV pole in hallway, pt with more confidence in L LE and weight bearing equally   Stairs             Wheelchair Mobility    Modified Rankin (Stroke Patients Only)       Balance                                            Cognition Arousal/Alertness: Awake/alert Behavior During Therapy: WFL for tasks assessed/performed Overall Cognitive Status: Within Functional Limits for tasks assessed                                        Exercises      General Comments        Pertinent Vitals/Pain Pain Assessment:  0-10 Pain Score: 6  Pain Location: L knee with exercises Pain Descriptors / Indicators: Sore Pain Intervention(s): Repositioned;Monitored during session(pt reports pain much improved since admission, more tolerable)    Home Living                      Prior Function            PT Goals (current goals can now be found in the care plan section) Progress towards PT goals: Goals met/education completed, patient discharged from PT    Frequency    Min 2X/week      PT Plan Other (comment)(d/c from acute PT)    Co-evaluation              AM-PAC PT "6 Clicks" Mobility   Outcome Measure  Help needed turning from your back to your side while in a flat bed without using bedrails?: None Help needed moving from lying on your back to sitting on the side of a flat bed without using bedrails?: None Help needed moving to and from a bed to a chair (including  a wheelchair)?: None Help needed standing up from a chair using your arms (e.g., wheelchair or bedside chair)?: None Help needed to walk in hospital room?: None Help needed climbing 3-5 steps with a railing? : None 6 Click Score: 24    End of Session   Activity Tolerance: Patient tolerated treatment well Patient left: in bed;with call bell/phone within reach   PT Visit Diagnosis: Difficulty in walking, not elsewhere classified (R26.2)     Time: 2761-8485 PT Time Calculation (min) (ACUTE ONLY): 11 min  Charges:  $Gait Training: 8-22 mins           Arlyce Dice, DPT Acute Rehabilitation Services Office: [place] 06/29/2019, 2:29 PM

## 2019-06-29 NOTE — Progress Notes (Signed)
CHL NOTE REDACTION - DIFFERENT PATIENT  Due to an Incorrect Registration or Identity Theft, we are redacting this note. In these particular instances; both names are NOT the same patient.  We have changed the demographic information to reflect those of the correct patient.  Please review for accuracy and ? Mark Complt the note.  Please do not refresh the body of the note.   Should the change in demographics impact clinical decisions that had been made at the time; You may choose to Edit Note from our Note Redaction.  This will create an [place] [employer] Identity Application Analyst ([id number]) [phone] - [email or web address] [email or web address]

## 2019-06-30 DIAGNOSIS — E876 Hypokalemia: Secondary | ICD-10-CM | POA: Diagnosis not present

## 2019-06-30 DIAGNOSIS — D735 Infarction of spleen: Secondary | ICD-10-CM | POA: Diagnosis not present

## 2019-06-30 DIAGNOSIS — I330 Acute and subacute infective endocarditis: Secondary | ICD-10-CM | POA: Diagnosis not present

## 2019-06-30 DIAGNOSIS — A4101 Sepsis due to Methicillin susceptible Staphylococcus aureus: Secondary | ICD-10-CM | POA: Diagnosis not present

## 2019-06-30 DIAGNOSIS — B182 Chronic viral hepatitis C: Secondary | ICD-10-CM

## 2019-06-30 DIAGNOSIS — M009 Pyogenic arthritis, unspecified: Secondary | ICD-10-CM

## 2019-06-30 MED ORDER — ORITAVANCIN DIPHOSPHATE 400 MG IV SOLR
1200.0000 mg | Freq: Once | INTRAVENOUS | Status: AC
  Administered 2019-06-30: 1200 mg via INTRAVENOUS
  Filled 2019-06-30: qty 120

## 2019-06-30 NOTE — Progress Notes (Signed)
Pt now informs RN that she can't stay for the remaining of the IV Antibiotic infusion and has to leave. Pt states "I have to go now get this out of my arm or I will take it out" IV team notified and is in route to remove PICC line. Dr. Karleen Hampshire given update and RN to have Pt to sign AMA paper.

## 2019-06-30 NOTE — Discharge Summary (Signed)
CHL NOTE REDACTION - DIFFERENT PATIENT  Due to an Incorrect Registration or Identity Theft, we are redacting this note. In these particular instances; both names are NOT the same patient.  We have changed the demographic information to reflect those of the correct patient.  Please review for accuracy and ? Mark Complt the note.  Please do not refresh the body of the note.   Should the change in demographics impact clinical decisions that had been made at the time; You may choose to Edit Note from our Note Redaction.  This will create an [place] [employer] Identity Application Analyst ([id number]) [phone] - [email or web address] [email or web address]

## 2019-06-30 NOTE — Progress Notes (Signed)
IV team informed Pt that after PICC line removed pt must remain in bed for 30 minutes after removal. The time would be 1705pm. Pt called RN into room.Pt up in the room packing up her belongings. Pt states "I am leaving I know what she told me but it will be fine" Dressing to right arm was clean dry and intact when Pt left. AMA paper signed

## 2019-06-30 NOTE — Progress Notes (Addendum)
    [place] [facility] for Infectious Disease    Date of Admission:  06/15/2019   Total days of antibiotics 14+  ID: Michelle Doyle is a 29 y.o. female with  Active Problems:   Endocarditis   Hypokalemia   Septic arthritis ([place])   Splenic infarct   Bacteremia due to methicillin susceptible Staphylococcus aureus (MSSA)   IV drug abuse ([place])   Tobacco abuse   Positive hepatitis C antibody test    Subjective: Afebrile. Has family stressors and wants to get discharged since her young kids will be unattended, concern that social services will be called  Medications:   Chlorhexidine Gluconate Cloth  6 each Topical Daily   enoxaparin (LOVENOX) injection  40 mg Subcutaneous Q24H   methadone  40 mg Oral Daily   nicotine  21 mg Transdermal Daily    Objective: Vital signs in last 24 hours: Temp:  [97.6 F (36.4 C)-98.4 F (36.9 C)] 97.6 F (36.4 C) (04/27 0438) Pulse Rate:  [94-100] 94 (04/27 0438) Resp:  [18] 18 (04/27 0438) BP: (114-120)/(84-92) 114/84 (04/27 0438) SpO2:  [98 %-100 %] 98 % (04/27 0438) Physical Exam  Constitutional:  oriented to person, place, and time. appears well-developed and well-nourished. No distress.  HENT: [place]/AT, PERRLA, no scleral icterus Mouth/Throat: Oropharynx is clear and moist. No oropharyngeal exudate.  Cardiovascular: Normal rate, regular rhythm and normal heart sounds. Exam reveals no gallop and no friction rub.  No murmur heard.  Pulmonary/Chest: Effort normal and breath sounds normal. No respiratory distress.  has no wheezes.  Neck = supple, no nuchal rigidity Abdominal: Soft. Bowel sounds are normal.  exhibits no distension. There is no tenderness.  Ext: right arm PICC is c/d/i. And left knee wrapped Skin: Skin is warm and dry. No rash noted. No erythema.  Psychiatric: a normal mood and affect.  behavior is normal.    Lab Results Recent Labs    06/29/19 0434  WBC 8.0  HGB 8.5*  HCT 27.1*  NA 140  K 3.6  CL 106  CO2 27  BUN 10    CREATININE 0.51   Liver Panel Recent Labs    06/29/19 0434  PROT 7.3  ALBUMIN 1.8*  AST 8*  ALT 7  ALKPHOS 68  BILITOT 0.8    Microbiology: 4/13 blood cx ngtd 4/11 blood cx mssa Studies/Results: No results found.   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   [facility] for Infectious Diseases Cell: [phone] Pager: [phone]  06/30/2019, 12:41 PM

## 2019-06-30 NOTE — Progress Notes (Signed)
Pt states that she is leaving AMA Pt states " I have to leave now there are things going on that I have to deal with"  Infectious MD now rounding on Pt and is aware that Pt stating she is leaving AMA. Dr. Karleen Hampshire also is aware. Infectious MD and Pharmacy are discussing Medications. MD and Pharmacy discussed with the Patient regarding the importance and need for Antibiotics. Pt is agreeing to remaining here for a one time dose of Orbactiv IV this afternoon Pt states " I will stay here for that but if IV team isn't here right after I will take out this line myself"

## 2019-07-01 ENCOUNTER — Emergency Department (HOSPITAL_COMMUNITY)
Admission: EM | Admit: 2019-07-01 | Discharge: 2019-07-01 | Discharge status: Absent without leave | Payer: Self-pay | Source: Home / Self Care

## 2019-07-01 NOTE — ED Notes (Signed)
No answer from lobby  

## 2019-07-02 ENCOUNTER — Other Ambulatory Visit: Payer: Self-pay

## 2019-07-02 ENCOUNTER — Telehealth: Payer: Self-pay | Admitting: *Deleted

## 2019-07-02 ENCOUNTER — Emergency Department (HOSPITAL_COMMUNITY): Payer: Self-pay

## 2019-07-02 ENCOUNTER — Emergency Department (HOSPITAL_COMMUNITY): Admission: EM | Admit: 2019-07-02 | Discharge: 2019-07-02 | Discharge status: Absent without leave | Payer: Self-pay

## 2019-07-02 ENCOUNTER — Encounter (HOSPITAL_COMMUNITY): Payer: Self-pay | Admitting: Emergency Medicine

## 2019-07-02 ENCOUNTER — Emergency Department (HOSPITAL_COMMUNITY)
Admission: EM | Admit: 2019-07-02 | Discharge: 2019-07-02 | Discharge status: Against Medical Advice | Payer: Self-pay | Attending: Emergency Medicine | Admitting: Emergency Medicine

## 2019-07-02 DIAGNOSIS — Z79899 Other long term (current) drug therapy: Secondary | ICD-10-CM | POA: Insufficient documentation

## 2019-07-02 DIAGNOSIS — R7881 Bacteremia: Secondary | ICD-10-CM

## 2019-07-02 DIAGNOSIS — J45909 Unspecified asthma, uncomplicated: Secondary | ICD-10-CM | POA: Insufficient documentation

## 2019-07-02 DIAGNOSIS — F17200 Nicotine dependence, unspecified, uncomplicated: Secondary | ICD-10-CM | POA: Insufficient documentation

## 2019-07-02 DIAGNOSIS — I2690 Septic pulmonary embolism without acute cor pulmonale: Secondary | ICD-10-CM | POA: Insufficient documentation

## 2019-07-02 DIAGNOSIS — J189 Pneumonia, unspecified organism: Secondary | ICD-10-CM | POA: Insufficient documentation

## 2019-07-02 DIAGNOSIS — I76 Septic arterial embolism: Secondary | ICD-10-CM

## 2019-07-02 LAB — COMPREHENSIVE METABOLIC PANEL
ALT: 8 U/L (ref 0–44)
AST: 13 U/L — ABNORMAL LOW (ref 15–41)
Albumin: 2.3 g/dL — ABNORMAL LOW (ref 3.5–5.0)
Alkaline Phosphatase: 78 U/L (ref 38–126)
Anion gap: 8 (ref 5–15)
BUN: 13 mg/dL (ref 6–20)
CO2: 25 mmol/L (ref 22–32)
Calcium: 8.3 mg/dL — ABNORMAL LOW (ref 8.9–10.3)
Chloride: 105 mmol/L (ref 98–111)
Creatinine, Ser: 0.55 mg/dL (ref 0.44–1.00)
GFR calc Af Amer: >60 mL/min (ref >60)
GFR calc non Af Amer: >60 mL/min (ref >60)
Glucose, Bld: 129 mg/dL — ABNORMAL HIGH (ref 70–99)
Potassium: 3.2 mmol/L — ABNORMAL LOW (ref 3.5–5.1)
Sodium: 138 mmol/L (ref 135–145)
Total Bilirubin: 0.8 mg/dL (ref 0.3–1.2)
Total Protein: 8.6 g/dL — ABNORMAL HIGH (ref 6.5–8.1)

## 2019-07-02 LAB — CBC WITH DIFFERENTIAL/PLATELET
Abs Immature Granulocytes: 0.03 10*3/uL (ref 0.00–0.07)
Basophils Absolute: 0.1 10*3/uL (ref 0.0–0.1)
Basophils Relative: 1 %
Eosinophils Absolute: 0.2 10*3/uL (ref 0.0–0.5)
Eosinophils Relative: 2 %
HCT: 32.6 % — ABNORMAL LOW (ref 36.0–46.0)
Hemoglobin: 10.5 g/dL — ABNORMAL LOW (ref 12.0–15.0)
Immature Granulocytes: 0 %
Lymphocytes Relative: 26 %
Lymphs Abs: 2.2 10*3/uL (ref 0.7–4.0)
MCH: 28.3 pg (ref 26.0–34.0)
MCHC: 32.2 g/dL (ref 30.0–36.0)
MCV: 87.9 fL (ref 80.0–100.0)
Monocytes Absolute: 0.4 10*3/uL (ref 0.1–1.0)
Monocytes Relative: 5 %
Neutro Abs: 5.8 10*3/uL (ref 1.7–7.7)
Neutrophils Relative %: 66 %
Platelets: 493 10*3/uL — ABNORMAL HIGH (ref 150–400)
RBC: 3.71 MIL/uL — ABNORMAL LOW (ref 3.87–5.11)
RDW: 17.2 % — ABNORMAL HIGH (ref 11.5–15.5)
WBC: 8.8 10*3/uL (ref 4.0–10.5)
nRBC: 0 % (ref 0.0–0.2)

## 2019-07-02 LAB — URINALYSIS, ROUTINE W REFLEX MICROSCOPIC
Bilirubin Urine: NEGATIVE
Glucose, UA: NEGATIVE mg/dL
Hgb urine dipstick: NEGATIVE
Ketones, ur: NEGATIVE mg/dL
Leukocytes,Ua: NEGATIVE
Nitrite: NEGATIVE
Protein, ur: NEGATIVE mg/dL
Specific Gravity, Urine: 1.016 (ref 1.005–1.030)
pH: 7 (ref 5.0–8.0)

## 2019-07-02 LAB — PROTIME-INR
INR: 1 (ref 0.8–1.2)
Prothrombin Time: 13.1 seconds (ref 11.4–15.2)

## 2019-07-02 LAB — I-STAT BETA HCG BLOOD, ED (MC, WL, AP ONLY): I-stat hCG, quantitative: <5 m[IU]/mL (ref <5)

## 2019-07-02 LAB — TROPONIN I (HIGH SENSITIVITY)
Troponin I (High Sensitivity): 3 ng/L (ref <18)
Troponin I (High Sensitivity): 2 ng/L (ref <18)

## 2019-07-02 LAB — APTT: aPTT: 41 seconds — ABNORMAL HIGH (ref 24–36)

## 2019-07-02 LAB — D-DIMER, QUANTITATIVE: D-Dimer, Quant: 3.13 ug/mL-FEU — ABNORMAL HIGH (ref 0.00–0.50)

## 2019-07-02 LAB — LACTIC ACID, PLASMA: Lactic Acid, Venous: 1.8 mmol/L (ref 0.5–1.9)

## 2019-07-02 MED ORDER — SODIUM CHLORIDE 0.9 % IV BOLUS
1000.0000 mL | Freq: Once | INTRAVENOUS | Status: AC
  Administered 2019-07-02: 08:00:00 1000 mL via INTRAVENOUS

## 2019-07-02 MED ORDER — SODIUM CHLORIDE 0.9 % IV SOLN
2.0000 g | INTRAVENOUS | Status: DC
  Filled 2019-07-02: qty 2000

## 2019-07-02 MED ORDER — BENZONATATE 100 MG PO CAPS
100.0000 mg | ORAL_CAPSULE | Freq: Once | ORAL | Status: AC
  Administered 2019-07-02: 08:00:00 100 mg via ORAL
  Filled 2019-07-02: qty 1

## 2019-07-02 MED ORDER — SODIUM CHLORIDE (PF) 0.9 % IJ SOLN
INTRAMUSCULAR | Status: AC
  Filled 2019-07-02: qty 50

## 2019-07-02 MED ORDER — IOHEXOL 350 MG/ML SOLN
100.0000 mL | Freq: Once | INTRAVENOUS | Status: AC | PRN
  Administered 2019-07-02: 100 mL via INTRAVENOUS

## 2019-07-02 MED ORDER — KETOROLAC TROMETHAMINE 15 MG/ML IJ SOLN
15.0000 mg | Freq: Once | INTRAMUSCULAR | Status: AC
  Administered 2019-07-02: 08:00:00 15 mg via INTRAVENOUS
  Filled 2019-07-02: qty 1

## 2019-07-02 MED ORDER — METHADONE HCL 5 MG PO TABS
40.0000 mg | ORAL_TABLET | Freq: Once | ORAL | Status: AC
  Administered 2019-07-02: 08:00:00 40 mg via ORAL
  Filled 2019-07-02: qty 8

## 2019-07-02 MED ORDER — POTASSIUM CHLORIDE CRYS ER 20 MEQ PO TBCR
40.0000 meq | EXTENDED_RELEASE_TABLET | Freq: Once | ORAL | Status: AC
  Administered 2019-07-02: 40 meq via ORAL
  Filled 2019-07-02: qty 2

## 2019-07-02 NOTE — ED Notes (Signed)
Transported to CT at this time. 

## 2019-07-02 NOTE — Telephone Encounter (Signed)
Patient scheduled for IV dalbavancin on 5/4 at 9:00.  RN called patient, left voicemail with appointment information.

## 2019-07-02 NOTE — ED Notes (Signed)
ED Provider at bedside. 

## 2019-07-02 NOTE — ED Provider Notes (Signed)
[facility] DEPT Provider Note   CSN: [id number] Arrival date & time: 07/02/19  [id number]     History Chief Complaint  Patient presents with  . Chest Pain    Michelle Doyle is a 29 y.o. female.  HPI 29 year old female presents with chest pain.  Started about 3 hours ago.  She was at the methadone clinic and the doctor noticed she was having some shortness of breath and she acknowledged chest pain so she was sent here.  She was recently admitted to this hospital for 3 weeks and left AMA on 2/27.  States she had to help take care of her kids.  She was admitted for endocarditis and had a PICC line and was receiving antibiotics.  Was seeing Dr. Graylon Good.  She is not sure what the antibiotics were.  When she was admitted last time she had given her sister's name so all the charting is under that name.  Did not receive her methadone this morning.  Chest pain is sharp.  She has been having a cough for a couple weeks and shortness of breath since she has been admitted.  No leg swelling.  Had a subjective fever 2 nights ago. No hemoptysis. Also having some low back pain for the past couple days. Last heroin use was 4/8.   Past Medical History:  Diagnosis Date  . Asthma   . Breast mass in female November 2012   left breast mass     There are no problems to display for this patient.   Past Surgical History:  Procedure Laterality Date  . TONSILLECTOMY AND ADENOIDECTOMY  2002     OB History   No obstetric history on file.     No family history on file.  Social History   Tobacco Use  . Smoking status: Current Every Day Smoker    Packs/day: 0.50  . Smokeless tobacco: Never Used  Substance Use Topics  . Alcohol use: No  . Drug use: No    Home Medications Prior to Admission medications   Medication Sig Start Date End Date Taking? Authorizing Provider  benzonatate (TESSALON) 100 MG capsule Take 100 mg by mouth 3 (three) times daily as needed for cough.    Yes [provider]  hydrOXYzine (ATARAX/VISTARIL) 50 MG tablet Take 50 mg by mouth 3 (three) times daily as needed.   Yes [provider]  methadone (DOLOPHINE) 1 MG/1ML solution Take 40 mg by mouth daily.   Yes [provider]  methocarbamol (ROBAXIN) 500 MG tablet Take 500 mg by mouth every 8 (eight) hours as needed for muscle spasms.   Yes [provider]  nicotine (NICODERM CQ - DOSED IN MG/24 HOURS) 21 mg/24hr patch Place 21 mg onto the skin daily.   Yes [provider]  QUEtiapine (SEROQUEL) 100 MG tablet Take 100 mg by mouth at bedtime. Take with 50mg    Yes [provider]  QUEtiapine (SEROQUEL) 50 MG tablet Take 50 mg by mouth at bedtime. Take with 100mg    Yes [provider]  acetaminophen (TYLENOL) 500 MG tablet Take 2 tablets (1,000 mg total) by mouth every 6 (six) hours as needed. Patient not taking: Reported on 07/02/2019 04/02/18   Charlesetta Shanks, MD  cephALEXin (KEFLEX) 500 MG capsule Take 2 capsules (1,000 mg total) by mouth 2 (two) times daily. Patient not taking: Reported on 07/02/2019 04/02/18   Charlesetta Shanks, MD  ibuprofen (ADVIL,MOTRIN) 600 MG tablet Take 1 tablet (600 mg total) by mouth every  8 (eight) hours as needed for fever. Patient not taking: Reported on 07/02/2019 04/02/18   Charlesetta Shanks, MD  ondansetron (ZOFRAN ODT) 4 MG disintegrating tablet Take 1 tablet (4 mg total) by mouth every 4 (four) hours as needed for nausea or vomiting. Patient not taking: Reported on 07/02/2019 04/02/18   Charlesetta Shanks, MD    Allergies    Patient has no known allergies.  Review of Systems   Review of Systems  Constitutional: Positive for fever.  Respiratory: Positive for cough and shortness of breath.   Cardiovascular: Positive for chest pain. Negative for leg swelling.  All other systems reviewed and are negative.   Physical Exam Updated Vital Signs BP (!) 132/96   Pulse 96   Temp 98.3 F (36.8 C) (Oral)    Resp 18   Ht 5\' 7"  (1.702 m)   Wt 59 kg   LMP 06/18/2019 (Approximate)   SpO2 100%   BMI 20.36 kg/m   Physical Exam Vitals and nursing note reviewed.  Constitutional:      General: She is not in acute distress.    Appearance: She is well-developed. She is not ill-appearing or diaphoretic.  HENT:     Head: Normocephalic and atraumatic.     Right Ear: External ear normal.     Left Ear: External ear normal.     Nose: Nose normal.  Eyes:     General:        Right eye: No discharge.        Left eye: No discharge.  Cardiovascular:     Rate and Rhythm: Regular rhythm. Tachycardia present.     Heart sounds: Normal heart sounds.  Pulmonary:     Effort: Pulmonary effort is normal.     Breath sounds: Normal breath sounds.  Abdominal:     Palpations: Abdomen is soft.     Tenderness: There is no abdominal tenderness.  Musculoskeletal:     Thoracic back: No tenderness.     Lumbar back: Tenderness present.     Right lower leg: No edema.     Left lower leg: No edema.  Skin:    General: Skin is warm and dry.  Neurological:     Mental Status: She is alert.     Comments: 5/5 strength in BLE.   Psychiatric:        Mood and Affect: Mood is not anxious.     ED Results / Procedures / Treatments   Labs (all labs ordered are listed, but only abnormal results are displayed) Labs Reviewed  COMPREHENSIVE METABOLIC PANEL - Abnormal; Notable for the following components:      Result Value   Potassium 3.2 (*)    Glucose, Bld 129 (*)    Calcium 8.3 (*)    Total Protein 8.6 (*)    Albumin 2.3 (*)    AST 13 (*)    All other components within normal limits  CBC WITH DIFFERENTIAL/PLATELET - Abnormal; Notable for the following components:   RBC 3.71 (*)    Hemoglobin 10.5 (*)    HCT 32.6 (*)    RDW 17.2 (*)    Platelets 493 (*)    All other components within normal limits  APTT - Abnormal; Notable for the following components:   aPTT 41 (*)    All other components within normal limits    URINALYSIS, ROUTINE W REFLEX MICROSCOPIC - Abnormal; Notable for the following components:   APPearance HAZY (*)    All other components within normal  limits  D-DIMER, QUANTITATIVE (NOT AT [facility]) - Abnormal; Notable for the following components:   D-Dimer, Quant 3.13 (*)    All other components within normal limits  CULTURE, BLOOD (ROUTINE X 2)  CULTURE, BLOOD (ROUTINE X 2)  URINE CULTURE  LACTIC ACID, PLASMA  PROTIME-INR  I-STAT BETA HCG BLOOD, ED ([facility], [facility], [facility] ONLY)  TROPONIN I (HIGH SENSITIVITY)  TROPONIN I (HIGH SENSITIVITY)    EKG EKG Interpretation  Date/Time:  Thursday July 02 2019 07:27:19 EDT Ventricular Rate:  121 PR Interval:    QRS Duration: 92 QT Interval:  330 QTC Calculation: 469 R Axis:   133 Text Interpretation: Sinus tachycardia Left posterior fascicular block RSR' in V1 or V2, probably normal variant No old tracing to compare Confirmed by Sherwood Gambler [phone]) on 07/02/2019 8:06:29 AM   Radiology DG Chest 2 View  Result Date: 07/02/2019 CLINICAL DATA:  Cough, chest pain, recent endocarditis EXAM: CHEST - 2 VIEW COMPARISON:  04/02/2018 FINDINGS: Cardiomegaly. Extensive bilateral, somewhat nodular heterogeneous airspace opacity and small bilateral pleural effusions. The osseous structures are unremarkable. IMPRESSION: 1.  Cardiomegaly. 2. Extensive bilateral, somewhat nodular heterogeneous airspace opacity and small bilateral pleural effusions, consistent with multifocal infection, particularly including hematogenous infection given history of endocarditis. Consider CT to further evaluate. Electronically Signed   By: Eddie Candle M.D.   On: 07/02/2019 09:26   DG Lumbar Spine Complete  Result Date: 07/02/2019 CLINICAL DATA:  Low back pain, recent endocarditis EXAM: LUMBAR SPINE - COMPLETE 4+ VIEW COMPARISON:  None FINDINGS: Five non-rib-bearing lumbar vertebra. Osseous mineralization normal. Vertebral body and disc space heights maintained. No fracture,  subluxation, or bone destruction. No spondylolysis. SI joints preserved. IMPRESSION: Normal exam. If patient has persistent/progressive pain or clinical concern for discitis, recommend MR assessment. Electronically Signed   By: Lavonia Dana M.D.   On: 07/02/2019 09:28   CT Angio Chest PE W and/or Wo Contrast  Result Date: 07/02/2019 CLINICAL DATA:  Shortness of breath.  Fever. EXAM: CT ANGIOGRAPHY CHEST WITH CONTRAST TECHNIQUE: Multidetector CT imaging of the chest was performed using the standard protocol during bolus administration of intravenous contrast. Multiplanar CT image reconstructions and MIPs were obtained to evaluate the vascular anatomy. CONTRAST:  117mL OMNIPAQUE IOHEXOL 350 MG/ML SOLN COMPARISON:  Chest radiograph July 02, 2019 FINDINGS: Cardiovascular: There is no demonstrable pulmonary embolus. There is no appreciable thoracic aortic aneurysm or dissection. The visualized great vessels appear unremarkable. Heart is mildly enlarged. There is a questionable filling defect in the left ventricular apex region measuring approximately 1.5 x 1.5 cm. No pericardial effusion or pericardial thickening. Mediastinum/Nodes: Visualized thyroid appears normal. There is an aortopulmonary window region lymph node measuring 1.6 x 1.5 cm. There is a subcarinal lymph node measuring no other appreciable lymph node enlargement evident. There are several subcentimeter mediastinal and axillary lymph nodes evident. No esophageal lesions are evident. Lungs/Pleura: There is a moderate free-flowing right pleural effusion. There is a small partially loculated left pleural effusion. There are multiple areas of cavitation throughout the lungs with surrounding areas of irregular opacity. Multiple non cavitated nodular opacities are noted throughout the lungs as well. Nodular opacities of this nature range in size from as small as 5 mm to as large as 3 x 2.5 cm. There is consolidation adjacent to areas of cavitation at multiple  sites, particularly in the upper lobe regions. Upper Abdomen: Visualized upper abdominal structures appear unremarkable. Musculoskeletal: There are no appreciable blastic or lytic bone lesions. No evident chest wall lesions. Review  of the MIP images confirms the above findings. IMPRESSION: 1. No demonstrable pulmonary embolus. No thoracic aortic aneurysm or dissection. 2. Multiple areas of consolidation and nodularity with multiple cavitary nodular lesions throughout the lungs. Appearance is consistent with multifocal pneumonia with septic emboli. There are pleural effusions bilaterally, larger on the right than on the left. 3. Mild cardiomegaly. Questionable thrombus in the left ventricular apex region. This finding may warrant correlation with echocardiography to further evaluate this region. 4. Enlarged aortopulmonary window region lymph node may well be of reactive etiology given the parenchymal lung findings. Several subcentimeter mediastinal and axillary lymph nodes also evident. Electronically Signed   By: Lowella Grip III M.D.   On: 07/02/2019 10:50    Procedures Procedures (including critical care time)  Medications Ordered in ED Medications  sodium chloride 0.9 % bolus 1,000 mL (0 mLs Intravenous Stopped 07/02/19 1001)  methadone (DOLOPHINE) tablet 40 mg (40 mg Oral Given 07/02/19 0826)  ketorolac (TORADOL) 15 MG/ML injection 15 mg (15 mg Intravenous Given 07/02/19 0826)  benzonatate (TESSALON) capsule 100 mg (100 mg Oral Given 07/02/19 0826)  potassium chloride SA (KLOR-CON) CR tablet 40 mEq (40 mEq Oral Given 07/02/19 1001)  iohexol (OMNIPAQUE) 350 MG/ML injection 100 mL (100 mLs Intravenous Contrast Given 07/02/19 1012)  sodium chloride (PF) 0.9 % injection (  Given by Other 07/02/19 1109)    ED Course  I have reviewed the triage vital signs and the nursing notes.  Pertinent labs & imaging results that were available during my care of the patient were reviewed by me and considered in  my medical decision making (see chart for details).    MDM Rules/Calculators/A&P                      Previous chart review is limited until patient revealed that she was using her sister's name.  I was able to review this chart with the MRN [id number].  Indicates that this CT of her chest is probably actually similar to the CT from her med last admission.  However the possible left ventricular thrombus is new.  I discussed her work-up with Dr. Baxter Flattery, who knows the patient.  She would recommend readmission with IV nafcillin.  However the patient adamantly does not want to be admitted.  She has already made an appointment for May 4 for weekly long-lasting antibiotic treatment.  Dr. Baxter Flattery reveals this is not ideal but if she will not stay then this is the best possible treatment.  The patient does seem to realize that she is leaving [place] with possible deleterious outcomes from this.  This includes blood clot of her heart, lungs, or stroke from this LV thrombus.  She seems understand that I am worried about her low back pain in the setting of endocarditis and needing to rule out epidural abscess.  She adamantly refuses MRI at this time.  She seems understand these possible bad effects and understands that she may return and is encouraged to return at any time.  Otherwise, Dr. Baxter Flattery indicates no antibiotics are needed at this time as she was loaded with extended release medicine in the hospital this week. Final Clinical Impression(s) / ED Diagnoses Final diagnoses:  Multifocal pneumonia  Septic embolism [facility])    Rx / DC Orders ED Discharge Orders    None       Sherwood Gambler, MD 07/02/19 [phone]

## 2019-07-02 NOTE — ED Triage Notes (Addendum)
Patient was admitted for endocarditis and left AMA 2 days ago. C/C chest pain, constant with no relief, patient left AMA and did not get sent any home abx. Denies N/V/D, does endorse fever at home. Previous IV drug use, on methadone now.

## 2019-07-02 NOTE — Discharge Instructions (Signed)
You are leaving [place].  If at any point you change your mind, feel new or worsening symptoms, or for any other reason want to come back to the ER you are encouraged to call 911 and or come back to the emergency room for evaluation and treatment.  Otherwise follow-up with the infectious disease clinic on May 4 as scheduled.

## 2019-07-02 NOTE — Telephone Encounter (Signed)
-----   Message from Susa Raring, [facility] sent at 07/02/2019  9:10 AM EDT ----- Regarding: RE: dalbavancin in short stay Orders are in ----- Message ----- From: Landis Gandy, RN Sent: 07/01/2019   5:10 PM EDT To: Darletta Moll, RPH-CPP, # Subject: FW: dalbavancin in short stay                  Raquel Sarna, can you help with this order for short stay so she can get the appointment for infusion? Thank you!  Sharyn Lull ----- Message ----- From: Berton Mount, [facility] Sent: 07/01/2019  11:56 AM EDT To: Carlyle Basques, MD, Landis Gandy, RN Subject: RE: dalbavancin                                Thanks for info. I usually cant sign and hold unless I have outpatient encounter (I dont think I can enter on inpatient encounter and it be seen on outpatient encounter).  I dont have a generic order form for short stay.  I thought I kept one to keep in case I needed it but I do not see it in my mobile office (ie. Backpack).  In other similar cases, do you wait for drug to arrive (ie. Be approved) prior to sending orders, making appt, etc.  She told me the number listed in [facility] is a good contact # for her.   ----- Message ----- From: Landis Gandy, RN Sent: 06/30/2019   3:44 PM EDT To: Berton Mount, RPH, Carlyle Basques, MD Subject: RE: dalbavancin                                Thanks Dustin.  We need to set her up with appointments at Short Stay (they are closed right now). I will need the written orders faxed to [place] at [place] stay [phone]) unless you can do a sign and hold on the orders? Laverne will call the patient for appointments - likely one at a time to prevent no-shows. Please make sure we have good contact info for Kyra and that she knows she needs to answer the call. :) Musab Wingard ----- Message ----- From: Berton Mount, [facility] Sent: 06/30/2019   2:31 PM EDT To: Carlyle Basques, MD, Landis Gandy, RN Subject: dalbavancin                                    Hello  Sharyn Lull,    Patient is leaving AMA today with MSSA endocarditis.  I am faxing assistance for for dalbavancin.  Dr Baxter Flattery is prescriber.  She is getting orbactiv x 1 today prior to leaving. Dr Baxter Flattery and I discussed dalbavancin 1500mg  x 1 next week then 1000mg  x 1 two weeks after that (vs dalbavancin 1000mg  IV x 1 then 500mg  weekly x 2).  The drug will be shipped to [facility] if approved.    I am trying to find email about everything I need to do for these patients.  Let me know if I have failed to do something.   Thanks, [place] [employer] Cell: [phone]

## 2019-07-03 ENCOUNTER — Telehealth (HOSPITAL_COMMUNITY): Payer: Self-pay | Admitting: Pharmacist

## 2019-07-03 DIAGNOSIS — R7881 Bacteremia: Secondary | ICD-10-CM

## 2019-07-03 LAB — URINE CULTURE: Culture: NO GROWTH

## 2019-07-03 NOTE — Progress Notes (Signed)
Called patient and informed her of her appointment for her first Dalbavancin infusion - May 6 at 9am at [place] day.  Patient expressed understanding.  This is a change from a previously scheduled May 4 appointment.  Heide Guile, PharmD, BCPS-AQ ID Clinical Pharmacist Pager [phone]

## 2019-07-07 ENCOUNTER — Encounter (HOSPITAL_COMMUNITY): Payer: Self-pay

## 2019-07-07 LAB — CULTURE, BLOOD (ROUTINE X 2)
Culture: NO GROWTH
Special Requests: ADEQUATE

## 2019-07-09 ENCOUNTER — Inpatient Hospital Stay (HOSPITAL_COMMUNITY): Admission: RE | Admit: 2019-07-09 | Payer: Medicaid Other | Source: Ambulatory Visit

## 2019-07-15 ENCOUNTER — Telehealth: Payer: Self-pay

## 2019-07-15 NOTE — Telephone Encounter (Signed)
Michelle Doyle , NP [facility] calling regarding IV antibiotics ordered about discharge.  She is now in detention center and they would like to know how to treat her during this period since they are not able to bring her for frequent visits.   Per Dr Baxter Flattery patient can take doxycyline twice daily until they are able to coordinate IV infusions.  Once this has been worked out they will call our office to set up weekly infusions through short stay.    Laverle Patter, RN

## 2019-07-21 NOTE — Telephone Encounter (Signed)
Short stay appointment scheduled for dalba infusion 5/24 at 10am. Remaining appointments can be made by patient after infusion. Left voicemail for Dionne at [facility] to return call for appointment details.   Orders in chart for release   Carlean Purl, RN

## 2019-07-22 NOTE — Telephone Encounter (Signed)
Kristen from Short Stay calling to confirm treatment plan for patient. She will follow up with Jimmy Footman for specific details due to patient's history. Landis Gandy, RN

## 2019-07-23 ENCOUNTER — Encounter (HOSPITAL_COMMUNITY): Payer: Medicaid Other

## 2019-07-27 ENCOUNTER — Other Ambulatory Visit: Payer: Self-pay

## 2019-07-27 ENCOUNTER — Encounter (HOSPITAL_COMMUNITY)
Admission: RE | Admit: 2019-07-27 | Discharge: 2019-07-27 | Disposition: A | Discharge status: Home / Self Care | Source: Ambulatory Visit | Attending: Internal Medicine | Admitting: Internal Medicine

## 2019-07-27 VITALS — BP 109/71 | HR 103 | Resp 18

## 2019-07-27 DIAGNOSIS — R7881 Bacteremia: Secondary | ICD-10-CM | POA: Diagnosis not present

## 2019-07-27 DIAGNOSIS — B9561 Methicillin susceptible Staphylococcus aureus infection as the cause of diseases classified elsewhere: Secondary | ICD-10-CM | POA: Insufficient documentation

## 2019-07-27 LAB — COMPREHENSIVE METABOLIC PANEL
ALT: 28 U/L (ref 0–44)
AST: 23 U/L (ref 15–41)
Albumin: 3.5 g/dL (ref 3.5–5.0)
Alkaline Phosphatase: 81 U/L (ref 38–126)
Anion gap: 9 (ref 5–15)
BUN: 12 mg/dL (ref 6–20)
CO2: 24 mmol/L (ref 22–32)
Calcium: 9.4 mg/dL (ref 8.9–10.3)
Chloride: 106 mmol/L (ref 98–111)
Creatinine, Ser: 0.62 mg/dL (ref 0.44–1.00)
GFR calc Af Amer: >60 mL/min (ref >60)
GFR calc non Af Amer: >60 mL/min (ref >60)
Glucose, Bld: 86 mg/dL (ref 70–99)
Potassium: 3.9 mmol/L (ref 3.5–5.1)
Sodium: 139 mmol/L (ref 135–145)
Total Bilirubin: 0.9 mg/dL (ref 0.3–1.2)
Total Protein: 8.6 g/dL — ABNORMAL HIGH (ref 6.5–8.1)

## 2019-07-27 LAB — SEDIMENTATION RATE: Sed Rate: 32 mm/hr — ABNORMAL HIGH (ref 0–22)

## 2019-07-27 LAB — CBC
HCT: 37.4 % (ref 36.0–46.0)
Hemoglobin: 12.3 g/dL (ref 12.0–15.0)
MCH: 28.9 pg (ref 26.0–34.0)
MCHC: 32.9 g/dL (ref 30.0–36.0)
MCV: 88 fL (ref 80.0–100.0)
Platelets: 407 10*3/uL — ABNORMAL HIGH (ref 150–400)
RBC: 4.25 MIL/uL (ref 3.87–5.11)
RDW: 16.7 % — ABNORMAL HIGH (ref 11.5–15.5)
WBC: 8.1 10*3/uL (ref 4.0–10.5)
nRBC: 0 % (ref 0.0–0.2)

## 2019-07-27 LAB — C-REACTIVE PROTEIN: CRP: <0.5 mg/dL (ref <1.0)

## 2019-07-27 MED ORDER — DEXTROSE 5 % IV SOLN
1500.0000 mg | Freq: Once | INTRAVENOUS | Status: AC
  Administered 2019-07-27: 1500 mg via INTRAVENOUS
  Filled 2019-07-27: qty 75

## 2019-07-27 MED ORDER — DEXTROSE 5 % IV SOLN: 1000.0000 mg | Freq: Once | INTRAVENOUS | Status: DC

## 2019-07-27 NOTE — Discharge Instructions (Signed)
Dalbavancin injection What is this medicine? DALBAVANCIN (DAL ba Lucianne Lei sin) is an antibiotic. It is used to treat certain kinds of bacterial infections. It will not work for colds, flu, or other viral infections. This medicine may be used for other purposes; ask your health care provider or pharmacist if you have questions. COMMON BRAND NAME(S): DALVANCE What should I tell my health care provider before I take this medicine? They need to know if you have any of these conditions:  intestine problems, like colitis  an unusual or allergic reaction to dalbavancin, other medicines, foods, dyes, or preservatives  pregnant or trying to get pregnant  breast-feeding How should I use this medicine? This medicine is infused into a vein. It is usually given by a health care professional in a hospital or clinic setting. If you get this medicine at home, you will be taught how to prepare and give this medicine. Use exactly as directed. Take your medicine at regular intervals. Do not take your medicine more often than directed. It is important that you put your used needles and syringes in a special sharps container. Do not put them in a trash can. If you do not have a sharps container, call your pharmacist or healthcare provider to get one. Talk to your pediatrician regarding the use of this medicine in children. Special care may be needed. Overdosage: If you think you have taken too much of this medicine contact a poison control center or emergency room at once. NOTE: This medicine is only for you. Do not share this medicine with others. What if I miss a dose? It is important not to miss your dose. Call your doctor or health care professional if you are unable to keep an appointment. If you give yourself the medicine and you miss a dose, take it as soon as you can. If it is almost time for your next dose, take only that dose. Do not take double or extra doses. What may interact with this medicine? This  medicine may interact with the following medications:  birth control pills This list may not describe all possible interactions. Give your health care provider a list of all the medicines, herbs, non-prescription drugs, or dietary supplements you use. Also tell them if you smoke, drink alcohol, or use illegal drugs. Some items may interact with your medicine. What should I watch for while using this medicine? Tell your doctor or healthcare professional if your symptoms do not start to get better or if they get worse. Do not treat diarrhea with over the counter products. Contact your doctor if you have diarrhea that lasts more than 2 days or if it is severe and watery. What side effects may I notice from receiving this medicine? Side effects that you should report to your doctor or health care professional as soon as possible:  allergic reactions like skin rash, itching or hives, swelling of the face, lips, or tongue  bloody or watery diarrhea Side effects that usually do not require medical attention (report to your doctor or health care professional if they continue or are bothersome):  diarrhea  headache  nausea, vomiting This list may not describe all possible side effects. Call your doctor for medical advice about side effects. You may report side effects to FDA at 1-800-FDA-1088. Where should I keep my medicine? Keep out of the reach of children. This drug is usually given in a hospital or clinic and will not be stored at home. In rare cases, this medicine may  be given at home. If you are using this medicine at home, you will be instructed on how to store this medicine. Throw away any unused medicine after the expiration date on the label. NOTE: This sheet is a summary. It may not cover all possible information. If you have questions about this medicine, talk to your doctor, pharmacist, or health care provider.  2020 Elsevier/Gold Standard (2015-03-24 08:38:08)

## 2019-08-04 ENCOUNTER — Inpatient Hospital Stay (HOSPITAL_COMMUNITY): Admission: RE | Admit: 2019-08-04 | Payer: Medicaid Other | Source: Ambulatory Visit

## 2019-08-10 ENCOUNTER — Other Ambulatory Visit: Payer: Self-pay

## 2019-08-10 ENCOUNTER — Ambulatory Visit (HOSPITAL_COMMUNITY)
Admission: RE | Admit: 2019-08-10 | Discharge: 2019-08-10 | Disposition: A | Discharge status: Home / Self Care | Source: Ambulatory Visit | Attending: Internal Medicine | Admitting: Internal Medicine

## 2019-08-10 VITALS — BP 103/66 | HR 98 | Temp 98.4°F | Resp 18 | Ht 67.0 in | Wt 135.0 lb

## 2019-08-10 DIAGNOSIS — B9561 Methicillin susceptible Staphylococcus aureus infection as the cause of diseases classified elsewhere: Secondary | ICD-10-CM | POA: Insufficient documentation

## 2019-08-10 DIAGNOSIS — R7881 Bacteremia: Secondary | ICD-10-CM | POA: Diagnosis present

## 2019-08-10 LAB — COMPREHENSIVE METABOLIC PANEL
ALT: 48 U/L — ABNORMAL HIGH (ref 0–44)
AST: 46 U/L — ABNORMAL HIGH (ref 15–41)
Albumin: 3.6 g/dL (ref 3.5–5.0)
Alkaline Phosphatase: 75 U/L (ref 38–126)
Anion gap: 8 (ref 5–15)
BUN: 11 mg/dL (ref 6–20)
CO2: 24 mmol/L (ref 22–32)
Calcium: 9.2 mg/dL (ref 8.9–10.3)
Chloride: 105 mmol/L (ref 98–111)
Creatinine, Ser: 0.64 mg/dL (ref 0.44–1.00)
GFR calc Af Amer: >60 mL/min (ref >60)
GFR calc non Af Amer: >60 mL/min (ref >60)
Glucose, Bld: 82 mg/dL (ref 70–99)
Potassium: 4.7 mmol/L (ref 3.5–5.1)
Sodium: 137 mmol/L (ref 135–145)
Total Bilirubin: 0.7 mg/dL (ref 0.3–1.2)
Total Protein: 7.9 g/dL (ref 6.5–8.1)

## 2019-08-10 LAB — CBC
HCT: 39.6 % (ref 36.0–46.0)
Hemoglobin: 12.9 g/dL (ref 12.0–15.0)
MCH: 28.8 pg (ref 26.0–34.0)
MCHC: 32.6 g/dL (ref 30.0–36.0)
MCV: 88.4 fL (ref 80.0–100.0)
Platelets: 259 10*3/uL (ref 150–400)
RBC: 4.48 MIL/uL (ref 3.87–5.11)
RDW: 15.5 % (ref 11.5–15.5)
WBC: 7.2 10*3/uL (ref 4.0–10.5)
nRBC: 0 % (ref 0.0–0.2)

## 2019-08-10 LAB — C-REACTIVE PROTEIN: CRP: <0.5 mg/dL (ref <1.0)

## 2019-08-10 LAB — SEDIMENTATION RATE: Sed Rate: 33 mm/hr — ABNORMAL HIGH (ref 0–22)

## 2019-08-10 MED ORDER — DEXTROSE 5 % IV SOLN
1000.0000 mg | Freq: Once | INTRAVENOUS | Status: AC
  Administered 2019-08-10: 1000 mg via INTRAVENOUS
  Filled 2019-08-10: qty 50

## 2019-08-10 MED ORDER — SODIUM CHLORIDE 0.9% FLUSH: 10.0000 mL | INTRAVENOUS | Status: DC | PRN

## 2019-08-10 MED ORDER — DALBAVANCIN HCL 500 MG IV SOLR
1000.0000 mg | Freq: Once | INTRAVENOUS | Status: DC
  Filled 2019-08-10 (×2): qty 50

## 2019-08-17 ENCOUNTER — Encounter (HOSPITAL_COMMUNITY): Payer: Medicaid Other

## 2019-10-30 ENCOUNTER — Telehealth: Payer: Self-pay | Admitting: *Deleted

## 2019-10-30 NOTE — Telephone Encounter (Signed)
Received message from [place]. 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 RN attempted to return the call, but the call went to a full voicemail.  Landis Gandy, RN

## 2019-11-05 ENCOUNTER — Other Ambulatory Visit: Payer: Self-pay

## 2019-11-05 ENCOUNTER — Encounter (HOSPITAL_COMMUNITY): Payer: Self-pay | Admitting: Psychiatry

## 2019-11-05 ENCOUNTER — Telehealth (INDEPENDENT_AMBULATORY_CARE_PROVIDER_SITE_OTHER): Payer: No Payment, Other | Admitting: Psychiatry

## 2019-11-05 DIAGNOSIS — F3130 Bipolar disorder, current episode depressed, mild or moderate severity, unspecified: Secondary | ICD-10-CM

## 2019-11-05 DIAGNOSIS — F900 Attention-deficit hyperactivity disorder, predominantly inattentive type: Secondary | ICD-10-CM | POA: Diagnosis not present

## 2019-11-05 DIAGNOSIS — F17200 Nicotine dependence, unspecified, uncomplicated: Secondary | ICD-10-CM

## 2019-11-05 MED ORDER — BUPROPION HCL ER (XL) 150 MG PO TB24: 150.0000 mg | ORAL_TABLET | ORAL | 2 refills | Status: DC

## 2019-11-05 MED ORDER — QUETIAPINE FUMARATE 50 MG PO TABS: 50.0000 mg | ORAL_TABLET | Freq: Every day | ORAL | 2 refills | Status: DC

## 2019-11-05 MED ORDER — NICOTINE 21 MG/24HR TD PT24: 21.0000 mg | MEDICATED_PATCH | Freq: Every day | TRANSDERMAL | 2 refills | Status: DC

## 2019-11-05 MED ORDER — ATOMOXETINE HCL 40 MG PO CAPS: 40.0000 mg | ORAL_CAPSULE | Freq: Every day | ORAL | 2 refills | Status: DC

## 2019-11-05 NOTE — Progress Notes (Signed)
Psychiatric Initial Adult Assessment  Virtual Visit via Video Note  I connected with Michelle Doyle on 11/05/19 at  2:00 PM EDT by a video enabled telemedicine application and verified that I am speaking with the correct person using two identifiers.  Location: Patient: Home Provider: Clinic   I discussed the limitations of evaluation and management by telemedicine and the availability of in person appointments. The patient expressed understanding and agreed to proceed.  I provided 45 minutes of non-face-to-face time during this encounter.    Patient Identification: Michelle Doyle MRN:  [id number] Date of Evaluation:  11/05/2019 Referral Source: Beverly Sessions Chief Complaint:  "I'm having problems concentrating" Visit Diagnosis:    ICD-10-CM   1. Attention deficit hyperactivity disorder (ADHD), predominantly inattentive type  F90.0 atomoxetine (STRATTERA) 40 MG capsule  2. Bipolar I disorder, most recent episode depressed (H[facility])  F31.30 QUEtiapine (SEROQUEL) 50 MG tablet    buPROPion (WELLBUTRIN XL) 150 MG 24 hr tablet  3. Tobacco dependence  F17.200 buPROPion (WELLBUTRIN XL) 150 MG 24 hr tablet    nicotine (NICODERM CQ - DOSED IN MG/24 HOURS) 21 mg/24hr patch    History of Present Illness:  29 year old female seen today for initial psychiatric evaluation. She was referred to outpatient psychiatry by [facility] for medication management. She has a psychiatric history of adjustment disorder, insomnia, anxiety, polysubstance use (tobacco, heroin, and methamphetamines) and bipolar disorder. She notes that she has not been on medications in four months. She notes that in the past she was managed on Seroquel, Remeron, hydroxyzine, nicotine patch and methadone.   On exam patient is well groomed, pleasant, cooperative, and maintained good eye contact. She endorses depressive symptoms such as depressed mood, insomnia (3-4 hours a night), poor concentration, impaired memory, weight gain, decreased energy,  and increase appetite. She also endorses symptoms of mania such as distractibility, fluctuations in mood, racing thoughts, impulsive spending (notes she buys clothing), and irritability. She denies SI/HI/VAH or paranoia.  Patient informed Probation officer that she also has problems focusing. She notes that she is forgetful, disorganized, unable to focus when people speak to her, and notes that she avoids task that are mentally taxing. She notes that she has dealt with the above symptoms for years.  Patient informed provider that she is attempting to live a life of sobriety. She notes that she lives at [facility] and has been sober since May 7th 2021.   Patient agreeable to start Strattera 40 mg to help manage symptoms of ADHD. She is also agreeable to start Seroquel 50 mg to help manage mood and depression. She will also start Wellbutrin XL 150 to help mange depression and tobacco dependences. Potential side effects of medication and risks vs benefits of treatment vs non-treatment were explained and discussed. All questions were answered. No other concerns noted at this time.   Associated Signs/Symptoms: Depression Symptoms:  depressed mood, insomnia, fatigue, difficulty concentrating, impaired memory, anxiety, loss of energy/fatigue, weight gain, (Hypo) Manic Symptoms:  Distractibility, Elevated Mood, Flight of Ideas, Community education officer, Impulsivity, Irritable Mood, Anxiety Symptoms:  Excessive Worry, Psychotic Symptoms:  Denies PTSD Symptoms: Had a traumatic exposure:  Notes that she met her father in 2006 for the first time and reports that he was in and out of her life. He is now in prison for ten years. Also notes that her mother had undiagnosed mental health issues and reports that she was raised by her maternal grandparents.   Past Psychiatric History: Adjustment disorder, insomnia, anxiety, and Bipolar disorder  Previous Psychotropic Medications: yes trialed deprakote,  visteral, and remeron. Notes that she dislike it.   Substance Abuse History in the last 12 months:  Yes.    Consequences of Substance Abuse: Legal Consequences:  Notes that she was in jail for two and a half months for substance use.   Past Medical History:  Past Medical History:  Diagnosis Date  . Asthma   . Breast mass in female November 2012   left breast mass     Past Surgical History:  Procedure Laterality Date  . IRRIGATION AND DEBRIDEMENT KNEE Left 06/11/2019   Procedure: IRRIGATION AND DEBRIDEMENT KNEE;  Surgeon: Rod Can, MD;  Location: [facility];  Service: Orthopedics;  Laterality: Left;  . TONSILLECTOMY AND ADENOIDECTOMY  2002    Family Psychiatric History: Father and paternal uncle ADHD, Son ADHD, paternal aunt depression and bipolar, notes mother has undiagnosed mental issues  Family History: No family history on file.  Social History:   Social History   Socioeconomic History  . Marital status: Single    Spouse name: Not on file  . Number of children: Not on file  . Years of education: Not on file  . Highest education level: Not on file  Occupational History  . Not on file  Tobacco Use  . Smoking status: Current Every Day Smoker    Packs/day: 0.50  . Smokeless tobacco: Never Used  Substance and Sexual Activity  . Alcohol use: No  . Drug use: No  . Sexual activity: Not on file  Other Topics Concern  . Not on file  Social History Narrative  . Not on file   Social Determinants of Health   Financial Resource Strain:   . Difficulty of Paying Living Expenses: Not on file  Food Insecurity:   . Worried About Charity fundraiser in the Last Year: Not on file  . Ran Out of Food in the Last Year: Not on file  Transportation Needs:   . Lack of Transportation (Medical): Not on file  . Lack of Transportation (Non-Medical): Not on file  Physical Activity:   . Days of Exercise per Week: Not on file  . Minutes of Exercise per Session: Not on file  Stress:    . Feeling of Stress : Not on file  Social Connections:   . Frequency of Communication with Friends and Family: Not on file  . Frequency of Social Gatherings with Friends and Family: Not on file  . Attends Religious Services: Not on file  . Active Member of Clubs or Organizations: Not on file  . Attends Archivist Meetings: Not on file  . Marital Status: Not on file    Additional Social History: Patient resides in [place]. She is single. She has three children. Her 34 year old has been adopted, 67 year old daughter and a 55 year old son who lives with her paternal aunt. She smoke a half a pack of cigarettes a day. 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 acetaminophen (TYLENOL) 500 MG tablet Take 2 tablets (1,000 mg  total) by mouth every 6 (six) hours as needed. (Patient not taking: Reported on 07/02/2019) 30 tablet 0  . atomoxetine (STRATTERA) 40 MG capsule Take 1 capsule (40 mg total) by mouth daily. 30 capsule 2  . benzonatate (TESSALON) 100 MG capsule Take 100 mg by mouth 3 (three) times daily as needed for cough.    Marland Kitchen buPROPion (WELLBUTRIN XL) 150 MG 24 hr tablet Take 1 tablet (150 mg total) by mouth every morning. 30 tablet 2  . cephALEXin (KEFLEX) 500 MG capsule Take 2 capsules (1,000 mg total) by mouth 2 (two) times daily. (Patient not taking: Reported on 07/02/2019) 28 capsule 0  . hydrOXYzine (ATARAX/VISTARIL) 50 MG tablet Take 50 mg by mouth 3 (three) times daily as needed.    Marland Kitchen ibuprofen (ADVIL,MOTRIN) 600 MG  tablet Take 1 tablet (600 mg total) by mouth every 8 (eight) hours as needed for fever. (Patient not taking: Reported on 07/02/2019) 30 tablet 0  . methadone (DOLOPHINE) 1 MG/1ML solution Take 40 mg by mouth daily.    . methocarbamol (ROBAXIN) 500 MG tablet Take 500 mg by mouth every 8 (eight) hours as needed for muscle spasms.    . nicotine (NICODERM CQ - DOSED IN MG/24 HOURS) 21 mg/24hr patch Place 1 patch (21 mg total) onto the skin daily. 28 patch 2  . ondansetron (ZOFRAN ODT) 4 MG disintegrating tablet Take 1 tablet (4 mg total) by mouth every 4 (four) hours as needed for nausea or vomiting. (Patient not taking: Reported on 07/02/2019) 20 tablet 0  . QUEtiapine (SEROQUEL) 50 MG tablet Take 1 tablet (50 mg total) by mouth at bedtime. Take with 171m 30 tablet 2   No current facility-administered medications for this visit.    Musculoskeletal: Strength & Muscle Tone: Unable to assess due to virtual visit [place] Unable to assess due to virtual visit Patient leans: N/A  Psychiatric Specialty Exam: Review of Systems  There were no vitals taken for this visit.There is no height or weight on file to calculate BMI.  General Appearance: Well Groomed  Eye Contact:  Good  Speech:  Clear and Coherent and Normal Rate  Volume:  Normal  Mood:  Anxious and Depressed  Affect:  Congruent  Thought Process:  Coherent, Goal Directed and Linear  Orientation:  Full (Time, Place, and Person)  Thought Content:  WDL and Logical  Suicidal Thoughts:  No  Homicidal Thoughts:  No  Memory:  Immediate;   Good Recent;   Good Remote;   Good  Judgement:  Good  Insight:  Good  Psychomotor Activity:  Normal  Concentration:  Concentration: Good and Attention Span: Good  Recall:  Good  Fund of Knowledge:Good  Language: Good  Akathisia:  No  Handed:  Right  AIMS (if indicated): Not done  Assets:  Communication Skills Desire for Improvement Financial Resources/Insurance Housing Social Support   ADL's:  Intact  Cognition: WNL  Sleep:  Poor   Screenings:   Assessment and Plan: Patient endorses symptoms of anxiety, poor sleep and depression. She is agreeable to start Strattera 40 mg to help manage symptoms of ADHD. She is also agreeable to start Seroquel 50 mg to help manage mood and depression. She will also start Wellbutrin XL 150 to help mange depression and tobacco dependences.    1. Attention deficit hyperactivity disorder (ADHD), predominantly inattentive type Start - atomoxetine (STRATTERA) 40 MG capsule; Take 1 capsule (40 mg total) by mouth daily.  Dispense: 30 capsule; Refill: 2  2. Bipolar  I disorder, most recent episode depressed ([place])  Start- QUEtiapine (SEROQUEL) 50 MG tablet; Take 1 tablet (50 mg total) by mouth at bedtime. Take with 172m  Dispense: 30 tablet; Refill: 2 Start- buPROPion (WELLBUTRIN XL) 150 MG 24 hr tablet; Take 1 tablet (150 mg total) by mouth every morning.  Dispense: 30 tablet; Refill: 2  3. Tobacco dependence  Start- buPROPion (WELLBUTRIN XL) 150 MG 24 hr tablet; Take 1 tablet (150 mg total) by mouth every morning.  Dispense: 30 tablet; Refill: 2 Start- nicotine (NICODERM CQ - DOSED IN MG/24 HOURS) 21 mg/24hr patch; Place 1 patch (21 mg total) onto the skin daily.  Dispense: 28 patch; Refill: 2  1. Attention deficit hyperactivity disorder (ADHD), predominantly inattentive type  Start- atomoxetine (STRATTERA) 40 MG capsule; Take 1 capsule (40 mg total) by mouth daily.  Dispense: 30 capsule; Refill: 2  Follow up in 3 months.   BSalley Slaughter NP 9/2/20212:25 PM

## 2019-11-16 ENCOUNTER — Telehealth (HOSPITAL_COMMUNITY): Payer: Self-pay | Admitting: *Deleted

## 2019-11-16 NOTE — Telephone Encounter (Signed)
Patient called with concerns about her Med's recently prescribed atomoxetine (STRATTERA) 40 MG capsule, buPROPion (WELLBUTRIN XL) 150 MG 24 hr tablet, Which patient states she takes @ the same time in the AM. She's having Paranoia & is [employer]

## 2019-11-16 NOTE — Telephone Encounter (Signed)
Writer spoke to patient who notes that she is paranoid for the last week and believes people are watching her. She also notes that she has slight tremors. Provider informed patient that tremors can be a side effect of Wellbutrin. Patient instructed to cut Wellbutrin dose in half to see if symptoms subside. She was told to follow up with provider in one week. She denies SI/HI. At follow up Seroquel dose may be adjusted. No other concerns noted at this time.

## 2020-02-02 ENCOUNTER — Other Ambulatory Visit (HOSPITAL_COMMUNITY): Payer: Self-pay | Admitting: Psychiatry

## 2020-02-02 DIAGNOSIS — F17200 Nicotine dependence, unspecified, uncomplicated: Secondary | ICD-10-CM

## 2020-02-02 DIAGNOSIS — F3130 Bipolar disorder, current episode depressed, mild or moderate severity, unspecified: Secondary | ICD-10-CM

## 2020-02-04 ENCOUNTER — Telehealth (HOSPITAL_COMMUNITY): Payer: No Payment, Other | Admitting: Psychiatry

## 2020-02-23 ENCOUNTER — Encounter (HOSPITAL_COMMUNITY): Payer: Self-pay | Admitting: Psychiatry

## 2020-02-23 ENCOUNTER — Telehealth (INDEPENDENT_AMBULATORY_CARE_PROVIDER_SITE_OTHER): Payer: No Payment, Other | Admitting: Psychiatry

## 2020-02-23 ENCOUNTER — Other Ambulatory Visit: Payer: Self-pay

## 2020-02-23 DIAGNOSIS — F3130 Bipolar disorder, current episode depressed, mild or moderate severity, unspecified: Secondary | ICD-10-CM

## 2020-02-23 DIAGNOSIS — F900 Attention-deficit hyperactivity disorder, predominantly inattentive type: Secondary | ICD-10-CM | POA: Diagnosis not present

## 2020-02-23 NOTE — Progress Notes (Signed)
[facility] MD/PA/NP OP Progress Note  Virtual Visit via Telephone Note  I connected with Michelle Doyle on 02/23/20 at  2:30 PM EST by telephone and verified that I am speaking with the correct person using two identifiers.  Location: Patient: home Provider: Clinic   I discussed the limitations, risks, security and privacy concerns of performing an evaluation and management service by telephone and the availability of in person appointments. I also discussed with the patient that there may be a patient responsible charge related to this service. The patient expressed understanding and agreed to proceed.   I provided 30 minutes of non-face-to-face time during this encounter.      02/23/2020 2:52 PM Aniyiah Doyle  MRN:  [id number]  Chief Complaint: "I am pregnant so I stopped taking the medication"  HPI: 29 year old female seen today for follow up psychiatric evaluation.  She has a psychiatric history of adjustment disorder, insomnia, anxiety, polysubstance use (tobacco, heroin, and methamphetamines) and bipolar disorder.  She is currently prescribed Wellbutrin XL 150 mg, Seroquel 50 mg nightly, and Strattera 40 mg daily.  She informed Probation officer that she discontinued these medications because she is [redacted] weeks pregnant.    Today patient unable to log on virtually so her exam was done over the phone.  During exam she is pleasant, cooperative, and engaged in conversation.  She informed Probation officer that she is not anxious or depressed and reports that she is sleeping and eating well.  She also informed provider that her mood is stable and denies symptoms of mania.  She denies SI/HI/VAH or paranoia.    Patient informed Probation officer that she would like to be seen after she delivers her child.  She is currently [redacted] weeks pregnant and noted that she would follow-up with provider if needed.  No medications refilled were prescribed today.  Patient will follow up as needed.  No other concerns noted at this time.    Visit  Diagnosis:    ICD-10-CM   1. Attention deficit hyperactivity disorder (ADHD), predominantly inattentive type  F90.0   2. Bipolar I disorder, most recent episode depressed ([place])  F31.30     Past Psychiatric History: She has a psychiatric history of adjustment disorder, insomnia, anxiety, polysubstance use (tobacco, heroin, and methamphetamines) and bipolar disorder.  Past Medical History:  Past Medical History:  Diagnosis Date  . Asthma   . Breast mass in female November 2012   left breast mass     Past Surgical History:  Procedure Laterality Date  . IRRIGATION AND DEBRIDEMENT KNEE Left 06/11/2019   Procedure: IRRIGATION AND DEBRIDEMENT KNEE;  Surgeon: Rod Can, MD;  Location: [facility];  Service: Orthopedics;  Laterality: Left;  . TONSILLECTOMY AND ADENOIDECTOMY  2002    Family Psychiatric History: Father and paternal uncle ADHD, Son ADHD, paternal aunt depression and bipolar, notes mother has undiagnosed mental issues   Family History: No family history on file.  Social History:  Social History   Socioeconomic History  . Marital status: Single    Spouse name: Not on file  . Number of children: Not on file  . Years of education: Not on file  . Highest education level: Not on file  Occupational History  . Not on file  Tobacco Use  . Smoking status: Current Every Day Smoker    Packs/day: 0.50  . Smokeless tobacco: Never Used  Substance and Sexual Activity  . Alcohol use: No  . Drug use: No  . Sexual activity: Not on file  Other Topics Concern  . Not on file  Social History Narrative  . Not on file   Social Determinants of Health   Financial Resource Strain: Not on file  Food Insecurity: Not on file  Transportation Needs: Not on file  Physical Activity: Not on file  Stress: Not on file  Social Connections: Not on file    Allergies: No Known Allergies  Metabolic Disorder Labs: No results found for: HGBA1C, MPG No results found for: PROLACTIN No results  found for: CHOL, TRIG, HDL, CHOLHDL, VLDL, LDLCALC No results found for: TSH  Therapeutic Level Labs: No results found for: LITHIUM No results found for: VALPROATE No components found for:  CBMZ  Current Medications: Current Outpatient Medications  Medication Sig Dispense Refill  . acetaminophen (TYLENOL) 500 MG tablet Take 2 tablets (1,000 mg total) by mouth every 6 (six) hours as needed. (Patient not taking: Reported on 07/02/2019) 30 tablet 0  . atomoxetine (STRATTERA) 40 MG capsule Take 1 capsule (40 mg total) by mouth daily. 30 capsule 2  . benzonatate (TESSALON) 100 MG capsule Take 100 mg by mouth 3 (three) times daily as needed for cough.    Marland Kitchen buPROPion (WELLBUTRIN XL) 150 MG 24 hr tablet TAKE 1 TABLET BY MOUTH EVERY MORNING 30 tablet 2  . cephALEXin (KEFLEX) 500 MG capsule Take 2 capsules (1,000 mg total) by mouth 2 (two) times daily. (Patient not taking: Reported on 07/02/2019) 28 capsule 0  . hydrOXYzine (ATARAX/VISTARIL) 50 MG tablet Take 50 mg by mouth 3 (three) times daily as needed.    Marland Kitchen ibuprofen (ADVIL,MOTRIN) 600 MG tablet Take 1 tablet (600 mg total) by mouth every 8 (eight) hours as needed for fever. (Patient not taking: Reported on 07/02/2019) 30 tablet 0  . methadone (DOLOPHINE) 1 MG/1ML solution Take 40 mg by mouth daily.    . methocarbamol (ROBAXIN) 500 MG tablet Take 500 mg by mouth every 8 (eight) hours as needed for muscle spasms.    . nicotine (NICODERM CQ - DOSED IN MG/24 HOURS) 21 mg/24hr patch Place 1 patch (21 mg total) onto the skin daily. 28 patch 2  . ondansetron (ZOFRAN ODT) 4 MG disintegrating tablet Take 1 tablet (4 mg total) by mouth every 4 (four) hours as needed for nausea or vomiting. (Patient not taking: Reported on 07/02/2019) 20 tablet 0  . QUEtiapine (SEROQUEL) 50 MG tablet TAKE 1 TABLET BY MOUTH EVERY NIGHT AT BEDTIME(TK WITH THE 100MG  TABLET) 30 tablet 2   No current facility-administered medications for this visit.     Musculoskeletal: Strength  & Muscle Tone: Unable to assess due to telephone visit Gait & Station: Unable to assess due to telephone visit Patient leans: N/A  Psychiatric Specialty Exam: Review of Systems  There were no vitals taken for this visit.There is no height or weight on file to calculate BMI.  General Appearance: Unable to assess due to telephone visit  Eye Contact:  Unable to assess due to telephone visit  Speech:  Clear and Coherent and Normal Rate  Volume:  Normal  Mood:  Euthymic  Affect:  Appropriate and Congruent  Thought Process:  Coherent, Goal Directed and Linear  Orientation:  Full (Time, Place, and Person)  Thought Content: WDL and Logical   Suicidal Thoughts:  No  Homicidal Thoughts:  No  Memory:  Immediate;   Good Recent;   Good Remote;   Good  Judgement:  Good  Insight:  Good  Psychomotor Activity:  Normal  Concentration:  Concentration: Good and  Attention Span: Good  Recall:  Good  Fund of Knowledge: Good  Language: Good  Akathisia:  No  Handed:  Right  AIMS (if indicated): Not done  Assets:  Communication Skills Desire for Improvement Financial Resources/Insurance Housing Intimacy Social Support  ADL's:  Intact  Cognition: WNL  Sleep:  Good   Screenings:   Assessment and Plan: Patient denies symptoms of anxiety, depression, psychosis, mania, or insomnia.  She informed Probation officer that she discontinued her medication after finding out she was pregnant.  She notes that she would like to follow-up with provider after she delivers her child.  No medications prescribed today.  Patient will follow up as needed.  1. Attention deficit hyperactivity disorder (ADHD), predominantly inattentive type  2. Bipolar I disorder, most recent episode depressed ([place])  Follow-up as needed    Salley Slaughter, NP 02/23/2020, 2:52 PM

## 2021-07-29 ENCOUNTER — Other Ambulatory Visit (HOSPITAL_COMMUNITY): Payer: Self-pay | Admitting: Psychiatry

## 2021-07-29 DIAGNOSIS — F17200 Nicotine dependence, unspecified, uncomplicated: Secondary | ICD-10-CM

## 2021-07-29 DIAGNOSIS — F3130 Bipolar disorder, current episode depressed, mild or moderate severity, unspecified: Secondary | ICD-10-CM

## 2021-08-12 IMAGING — CR DG LUMBAR SPINE COMPLETE 4+V
5 series · 5 of 5 positions shown · non-contrast
Comparison: None

CLINICAL DATA: Low back pain, recent endocarditis

EXAM:
LUMBAR SPINE - COMPLETE 4+ VIEW

[t lumbar spine ap]
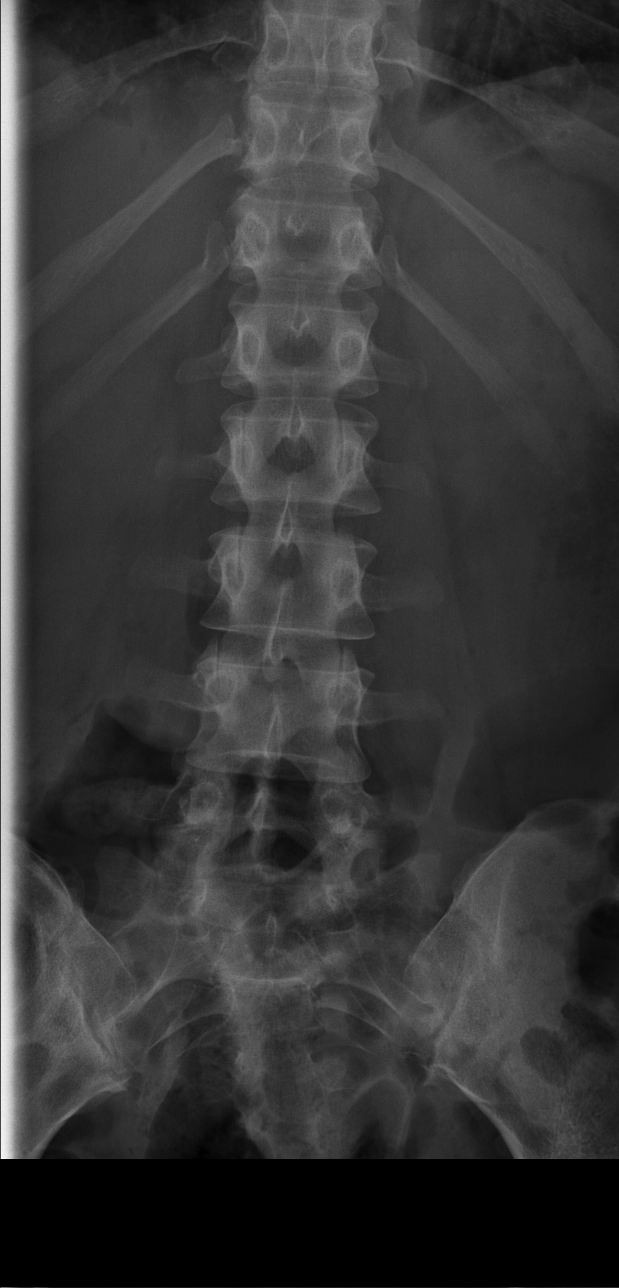

[t lumbar spine obl (1 of 2)]
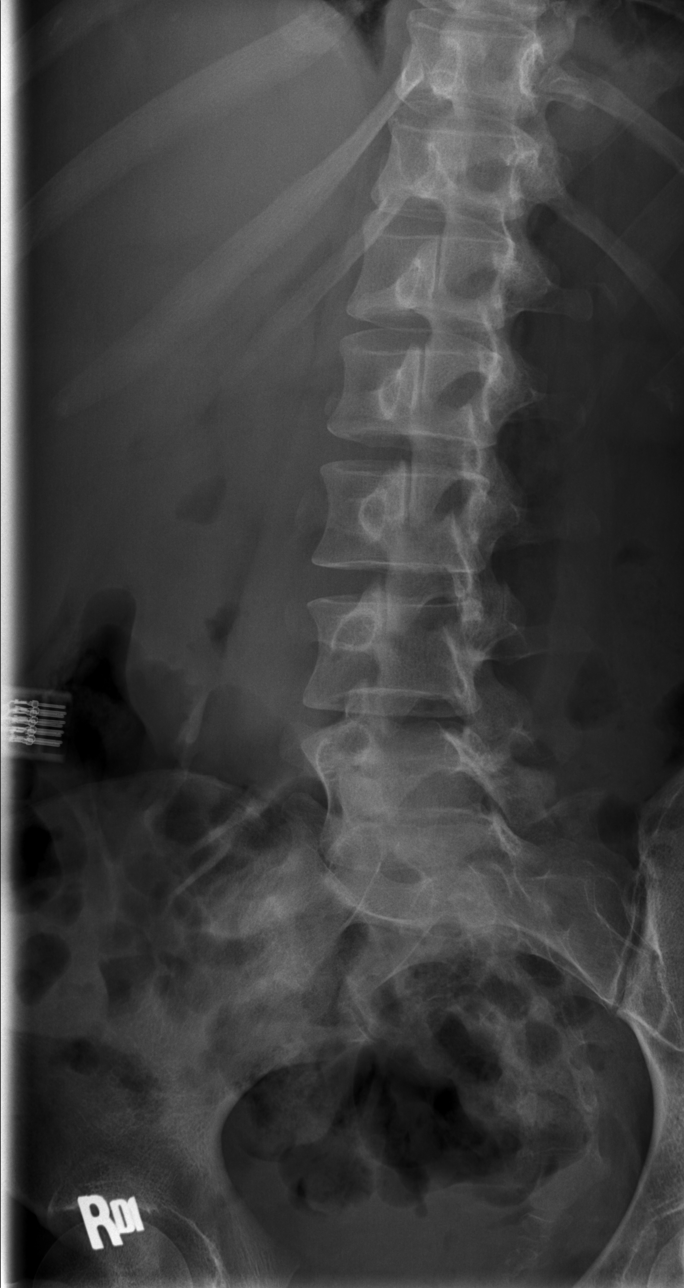

[t lumbar spine obl (2 of 2)]
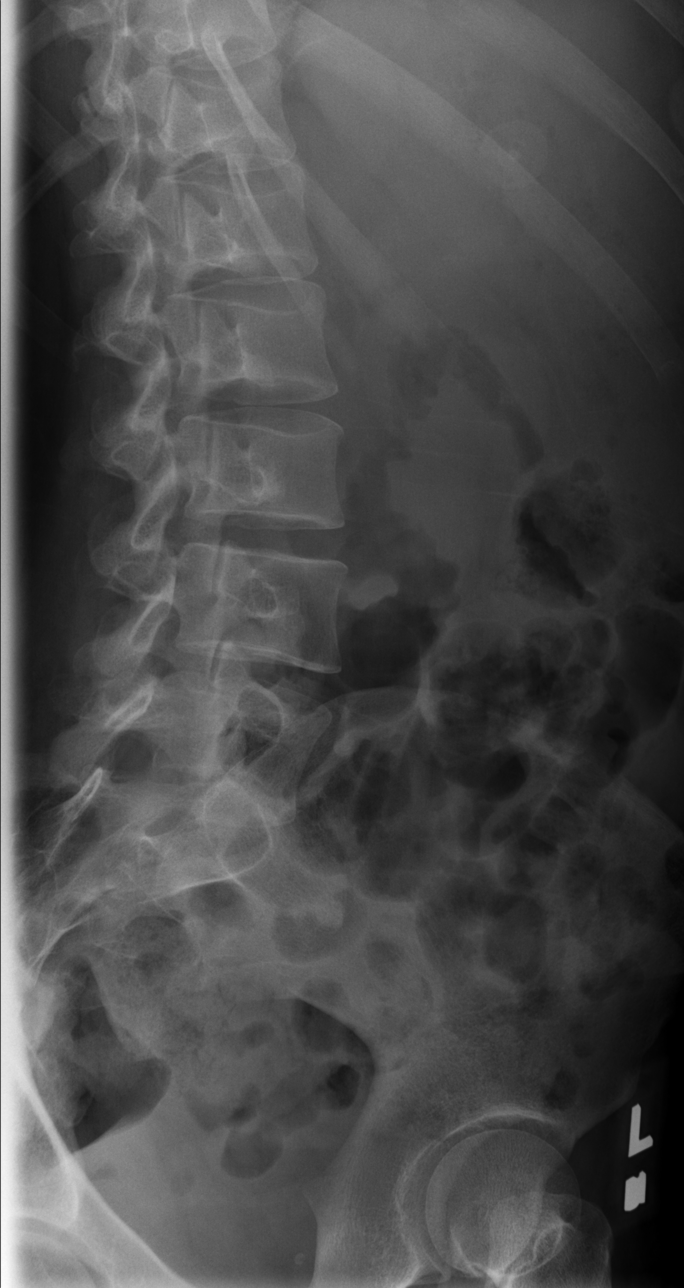

[t lumbar spine lat]
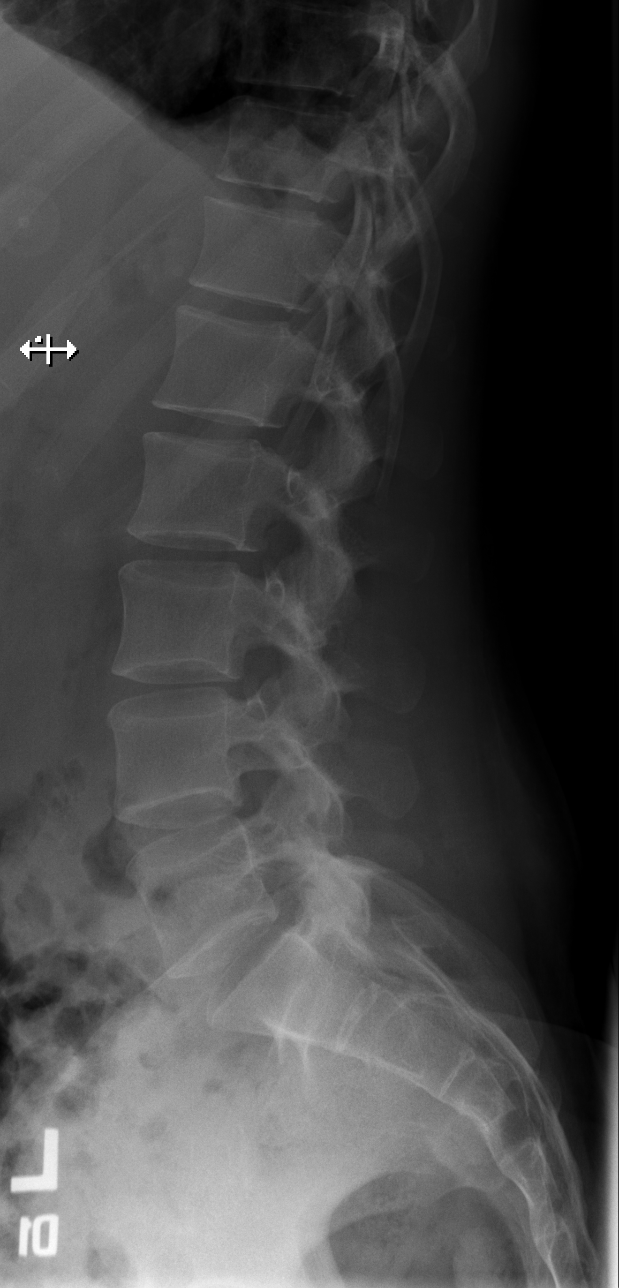

[t lumbar l-5 s-1 spot]
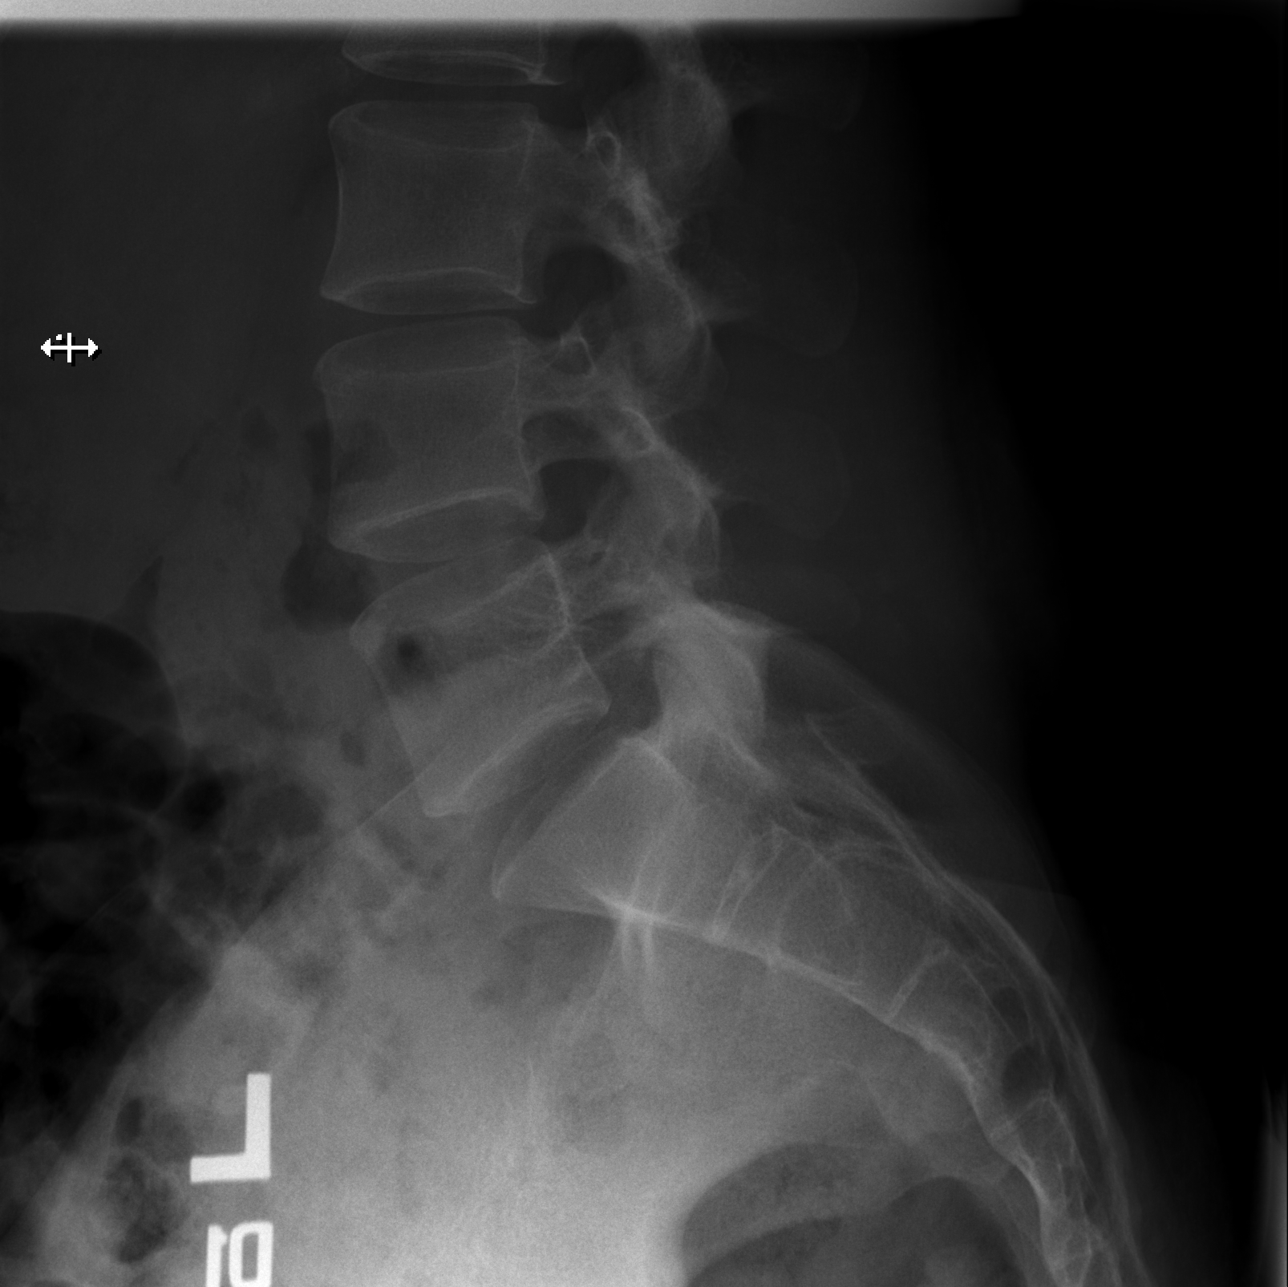

[5 of 5 positions shown; findings below may reference images not displayed]

FINDINGS: Five non-rib-bearing lumbar vertebra.

Osseous mineralization normal.

Vertebral body and disc space heights maintained.

No fracture, subluxation, or bone destruction.

No spondylolysis.

SI joints preserved.
IMPRESSION: Normal exam.

If patient has persistent/progressive pain or clinical concern for
discitis, recommend MR assessment.

## 2021-08-12 IMAGING — CR DG CHEST 2V
2 series · 2 of 2 positions shown · non-contrast
Comparison: 04/02/2018

CLINICAL DATA: Cough, chest pain, recent endocarditis

EXAM:
CHEST - 2 VIEW

[w chest pa]
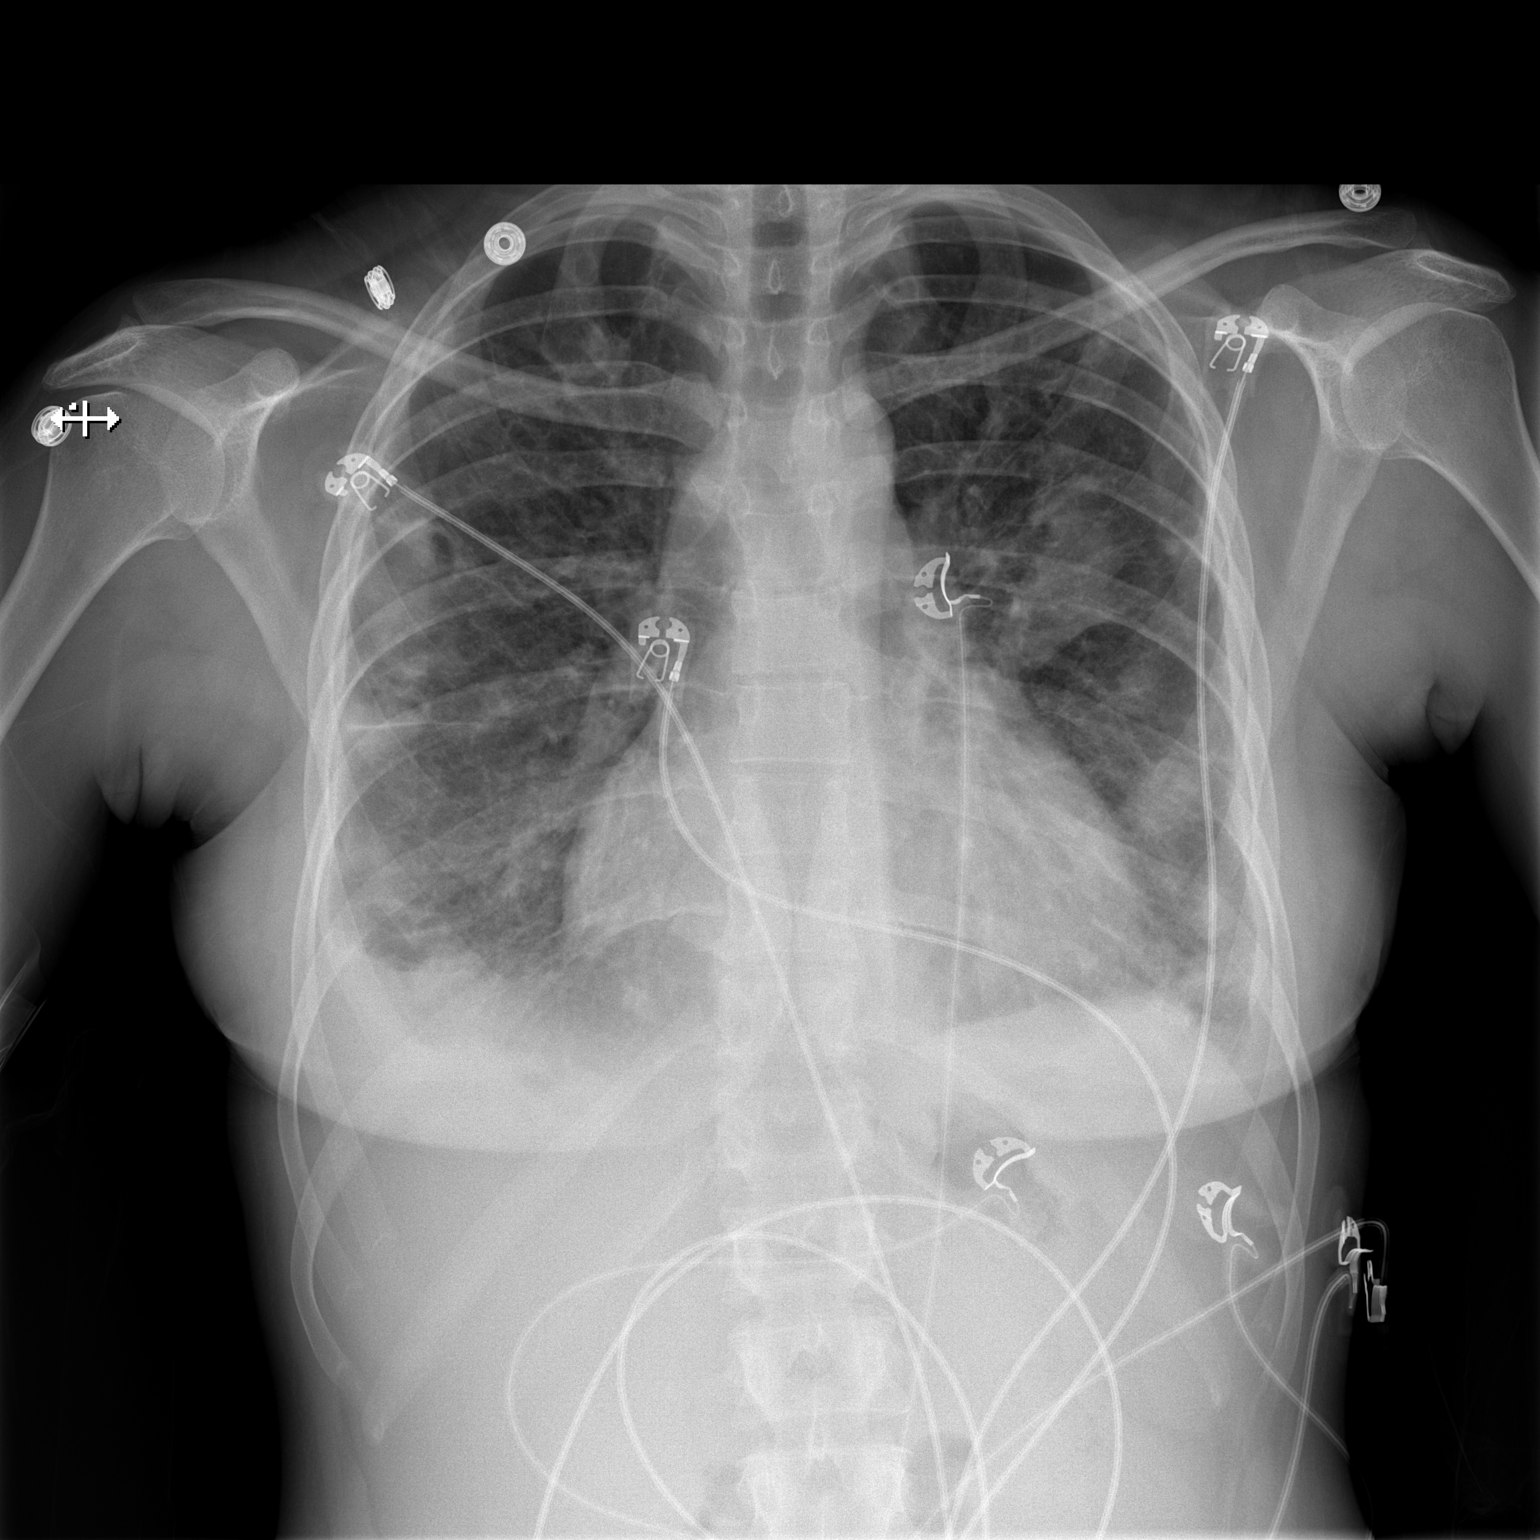

[w chest lat]
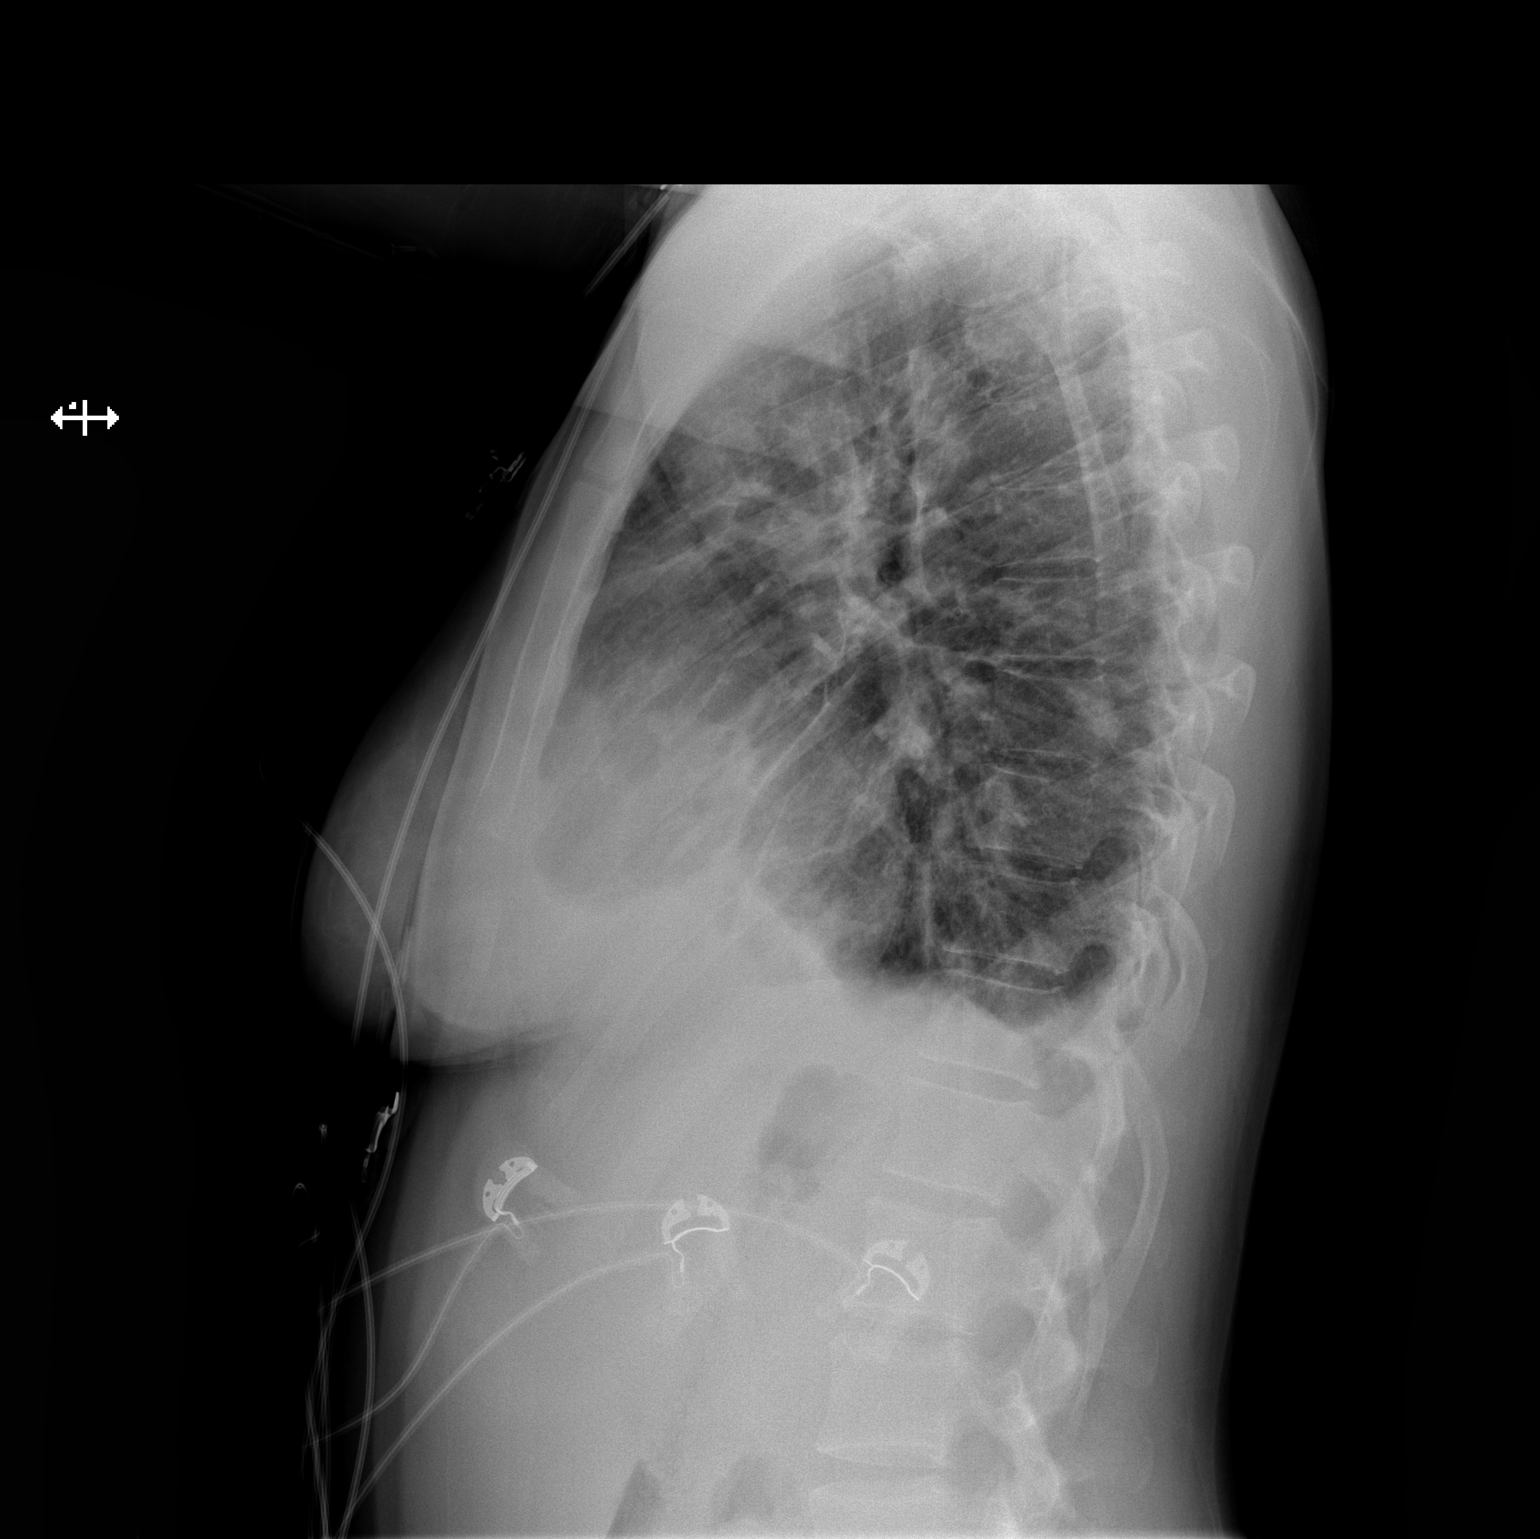

[2 of 2 positions shown; findings below may reference images not displayed]

FINDINGS: Cardiomegaly. Extensive bilateral, somewhat nodular heterogeneous
airspace opacity and small bilateral pleural effusions. The osseous
structures are unremarkable.
IMPRESSION: 1.  Cardiomegaly.

2. Extensive bilateral, somewhat nodular heterogeneous airspace
opacity and small bilateral pleural effusions, consistent with
multifocal infection, particularly including hematogenous infection
given history of endocarditis. Consider CT to further evaluate.

## 2022-07-11 ENCOUNTER — Encounter (HOSPITAL_COMMUNITY): Payer: Self-pay | Admitting: Internal Medicine

## 2022-07-11 ENCOUNTER — Encounter (HOSPITAL_COMMUNITY): Payer: Self-pay

## 2022-07-11 ENCOUNTER — Telehealth (HOSPITAL_COMMUNITY): Payer: Self-pay

## 2022-07-11 NOTE — Telephone Encounter (Signed)
Outside/paper referral received by Dr. Ty Hilts from [facility]. Will fax over Physician order and request further documents. Insurance benefits and eligibility to be determined.

## 2022-07-11 NOTE — Telephone Encounter (Signed)
Attempted to call patient in regards to Cardiac Rehab - LM on VM Mailed letter 

## 2022-07-17 ENCOUNTER — Ambulatory Visit (INDEPENDENT_AMBULATORY_CARE_PROVIDER_SITE_OTHER): Payer: Medicaid Other | Admitting: Clinical

## 2022-07-17 ENCOUNTER — Encounter (HOSPITAL_COMMUNITY): Payer: Self-pay

## 2022-07-17 DIAGNOSIS — F331 Major depressive disorder, recurrent, moderate: Secondary | ICD-10-CM

## 2022-07-17 NOTE — Progress Notes (Signed)
Comprehensive Clinical Assessment (CCA) Note  07/17/2022 Michelle Doyle [id number]  Chief Complaint:  Chief Complaint  Patient presents with   Depression   ADHD   Visit Diagnosis:   Major depressive disorder,recurrent episode, moderate   Interpretive Summary: Client is a 32 year old female presenting to the [facility] center for outpatient services. Client is presenting by her own referral as a previous client of [facility]. 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 Client reported she has been having a hard time focusing and completing tasks in a timely manner. Client reported a childhood hx of ADHD without medication use per her memory. Client reported it has negatively affected her ability to focus on her school work as well. Client reported she is worried about the job she is about to start without medication. Client reported she also is having symptoms of depression. Client reported having a hard time to get out of bed and goes back to bed every chance she gets. Client reported she is unsure if it could be related to postpartum depression. Client reported she gave birth 9 months ago. Client reported otherwise no illicit substance use or hx of hospitalization for mental health reasons. Client presented oriented times five, appropriately dressed, and friendly. Client denied hallucinations, delusions, suicidal and homicidal ideations. 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   The client was advised to call back or seek an in-person evaluation if the symptoms worsen or if the condition fails to improve as anticipated before the next scheduled  appointment. Client was in agreement with treatment recommendations.   CCA Biopsychosocial Intake/Chief Complaint:  Client reported she is presenting by her own referral today for an undated assessment to engage in outaptient services. Client reported she is having a hard time concentrating. Client reported she has a history of ADHD and Major depressive disorder. Client reported she gave birth 9 months ago.  Current Symptoms/Problems: Client reported difficutly concentrating and feeling down.  Patient Reported Schizophrenia/Schizoaffective Diagnosis in Past: No  Strengths: voluntarily seeking services  Preferences: psychiatry  Abilities: vocalize problems and needs  Type of Services Patient Feels are Needed: medication management  Initial Clinical Notes/Concerns: No data recorded  Mental Health Symptoms Depression:   Change in energy/activity   Duration of Depressive symptoms:  Greater than two weeks   Mania:   None   Anxiety:    None   Psychosis:   None   Duration of Psychotic symptoms: No data recorded  Trauma:   None   Obsessions:   None   Compulsions:   None   Inattention:   Disorganized; Poor follow-through on tasks; Symptoms before age 67   Hyperactivity/Impulsivity:   None   Oppositional/Defiant Behaviors:   None   Emotional Irregularity:   None   Other Mood/Personality Symptoms:  No data recorded   Mental Status Exam Appearance and self-care  Stature:   Tall   Weight:   Average weight   Clothing:   Casual   Grooming:   Normal   Cosmetic use:   Age appropriate   Posture/gait:   Normal   Motor activity:   Not Remarkable   Sensorium  Attention:   Normal   Concentration:   Normal  Orientation:   X5   Recall/memory:   Normal   Affect and Mood  Affect:   Congruent   Mood:   Euthymic   Relating  Eye contact:   Normal   Facial expression:   Responsive   Attitude toward examiner:   Cooperative   Thought  and Language  Speech flow:  Clear and Coherent   Thought content:   Appropriate to Mood and Circumstances   Preoccupation:   None   Hallucinations:   None   Organization:  No data recorded  [employer] of Knowledge:   Good   Intelligence:   Average   Abstraction:   Normal   Judgement:   Good   Reality Testing:   Adequate   Insight:   Good   Decision Making:   Normal   Social Functioning  Social Maturity:   Responsible   Social Judgement:   Normal   Stress  Stressors:   Transitions   Coping Ability:   Set designer Deficits:   Activities of daily living   Supports:   Family     Religion: Religion/Spirituality Are You A Religious Person?: No  Leisure/Recreation: Leisure / Recreation Do You Have Hobbies?: No  Exercise/Diet: Exercise/Diet Do You Exercise?: No Have You Gained or Lost A Significant Amount of Weight in the Past Six Months?: No Do You Follow a Special Diet?: No Do You Have Any Trouble Sleeping?: Yes   CCA Employment/Education Employment/Work Situation: Employment / Work Situation Employment Situation: Consulting civil engineer  Education: Education Did You Have Any Difficulty At [employer]?: Yes   CCA Family/Childhood History Family and Relationship History: Family history Marital status: Single Does patient have children?: Yes How many children?: 5  Childhood History:  Childhood History Additional childhood history information: client [place] client reported she was raised by her grandparents. client reported she had a good childhood. client reported she hated she was not with her mom. client reported her mom had two other kids and she was oldest. Client reported she hated she was not apart of being with her mom. Does patient have siblings?: Yes Number of Siblings: 2 Description of patient's current relationship with siblings: Client reported she has a brother and a sister. Did  patient suffer any verbal/emotional/physical/sexual abuse as a child?: No Did patient suffer from severe childhood neglect?: No Has patient ever been sexually abused/assaulted/raped as an adolescent or adult?: No Was the patient ever a victim of a crime or a disaster?: No Witnessed domestic violence?: No Has patient been affected by domestic violence as an adult?: No  Child/Adolescent Assessment:     CCA Substance Use Alcohol/Drug Use: Alcohol / Drug Use History of alcohol / drug use?: No history of alcohol / drug abuse                         ASAM's:  Six Dimensions of Multidimensional Assessment  Dimension 1:  Acute Intoxication and/or Withdrawal Potential:      Dimension 2:  Biomedical Conditions and Complications:      Dimension 3:  Emotional, Behavioral, or Cognitive Conditions and Complications:     Dimension 4:  Readiness to Change:     Dimension 5:  Relapse, Continued use, or Continued Problem Potential:     Dimension 6:  Recovery/Living Environment:     ASAM Severity Score:    ASAM Recommended Level of Treatment:     Substance use Disorder (SUD)  Recommendations for Services/Supports/Treatments: Recommendations for Services/Supports/Treatments Recommendations For Services/Supports/Treatments: Medication Management  DSM5 Diagnoses: Patient Active Problem List   Diagnosis Date Noted   Attention deficit hyperactivity disorder (ADHD), predominantly inattentive type 11/05/2019   Bipolar I disorder, most recent episode depressed (HCC) 11/05/2019   Tobacco dependence 11/05/2019   MSSA bacteremia 07/03/2019   IV drug abuse (HCC)    Tobacco abuse    Positive hepatitis C antibody test    Hypokalemia 06/15/2019   Septic arthritis (HCC) 06/15/2019   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   Endocarditis 06/11/2019   Sepsis (HCC) 06/11/2019    Patient Centered Plan: Patient is on the following Treatment Plan(s):  Depression   Referrals to Alternative  Service(s): Referred to Alternative Service(s):   Place:   Date:   Time:    Referred to Alternative Service(s):   Place:   Date:   Time:    Referred to Alternative Service(s):   Place:   Date:   Time:    Referred to Alternative Service(s):   Place:   Date:   Time:      Collaboration of Care: Medication Management [facility]  Patient/Guardian was advised Release of Information must be obtained prior to any record release in order to collaborate their care with an outside provider. Patient/Guardian was advised if they have not already done so to contact the registration department to sign all necessary forms in order for [place] to release information regarding their care.   Consent: Patient/Guardian gives verbal consent for treatment and assignment of benefits for services provided during this visit. Patient/Guardian expressed understanding and agreed to proceed.   Neena Rhymes Taquanna Borras, LCSW

## 2022-07-18 ENCOUNTER — Ambulatory Visit (HOSPITAL_COMMUNITY): Payer: Medicaid Other | Admitting: Student

## 2022-07-18 DIAGNOSIS — F411 Generalized anxiety disorder: Secondary | ICD-10-CM | POA: Diagnosis not present

## 2022-07-18 DIAGNOSIS — Z87898 Personal history of other specified conditions: Secondary | ICD-10-CM

## 2022-07-18 DIAGNOSIS — F3132 Bipolar disorder, current episode depressed, moderate: Secondary | ICD-10-CM | POA: Diagnosis not present

## 2022-07-18 MED ORDER — QUETIAPINE FUMARATE 100 MG PO TABS: 100.0000 mg | ORAL_TABLET | Freq: Every day | ORAL | 1 refills | Status: DC

## 2022-07-18 MED ORDER — ATOMOXETINE HCL 40 MG PO CAPS: 40.0000 mg | ORAL_CAPSULE | Freq: Every day | ORAL | 0 refills | Status: AC

## 2022-07-18 MED ORDER — QUETIAPINE FUMARATE 25 MG PO TABS
25.0000 mg | ORAL_TABLET | Freq: Every day | ORAL | 1 refills | Status: DC | PRN
Start: 2022-07-18 — End: 2022-07-19

## 2022-07-18 NOTE — Patient Instructions (Addendum)
References for adult ADHD testing:  Eula Flax, NP at [facility] [phone]) or [facility] [phone]) or Ronney Asters at [facility]. [facility] [phone] has multiple providers. [facility] [phone] has several providers specializing in ADHD/Bipolar= Andrena Mews, PhD is expert at Adult ADHD, Verna Czech, PhD treats adults with both diagnoses.    [facility] OUTPATIENT Walk-in information:  Please note, all walk-ins are first come & first serve, with limited number of availability. Therapist for therapy:  Monday & Wednesdays: Please ARRIVE at 7:15 AM for registration Will START at 8:00 AM Every 1st & 2nd Friday of the month: Please ARRIVE at 10:15 AM for registration Will START at 1 PM - 5 PM Psychiatrist for medication management: Monday - Friday:  Please ARRIVE at 7:15 AM for registration Will START at 8:00 AM       Regretfully, due to limited availability, please be aware that you may not been seen on the same day as walk-in. Please consider making an appoint or try again. Thank you for your patience and understanding.  [facility] [place] [address] [place], [place] [address] [phone]  New patients are seen at their walk-in clinic. Walk-in hours are Monday - Friday from 8:30 am - 12:00 pm, and from 1:00 pm - 2:30 pm.   Walk-in patients are seen on a first come, first served basis, so try to arrive as early as possible for the best chance of being seen the same day.

## 2022-07-18 NOTE — Progress Notes (Signed)
Psychiatric Initial Adult Assessment   Patient Identification: Michelle Doyle MRN:  [id number] Date of Evaluation:  07/18/2022 Referral Source: PCP Chief Complaint:  No chief complaint on file.  Visit Diagnosis: No diagnosis found.  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 Starts work on Friday. Has 2 young children (2 and 1 in July). No breastfeeding. Regular cycles monthly. Moody but no significant change. Drained all the time.   Up and down for sleep.   Current episode worsened 4 months ago. Had PPD with son. Not prescribed anything.   Less anxious when sleeping.   Seroquel worked well for insomnia and racing thoughts. Denies hx of AVH.   Manic episode: invincible, super mom, decreased need for sleep, elevated energy 2-3 days longest. Impulsive, worsened.  1 year ago. Never one week.   Nightmares 3-4x in past week, No flashbacks, No hypervigilance. Increased startle response. Avoids stepdad and intoxicated, loud people including his family.   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  Sleep and focus.   PHQ-9: 16 GAD-7: 18  Associated Signs/Symptoms: Depression Symptoms:  {DEPRESSION SYMPTOMS:20000} (Hypo) Manic Symptoms:  {BHH MANIC SYMPTOMS:22872} Anxiety Symptoms:  {BHH ANXIETY SYMPTOMS:22873} Psychotic Symptoms:  {BHH PSYCHOTIC SYMPTOMS:22874} PTSD Symptoms: {BHH PTSD SYMPTOMS:22875}  Past Psychiatric History: Bipolar disorder, Anxiety, Insomnia  Previous Psychotropic Medications: yes trialed depakote, vistaril, Seroquel, Wellbutrin (did not like effects- more paranoid), and remeron (believes it helped). Notes that she dislike it.   Substance Abuse History in the last 12 months:  {yes no:314532}  Consequences of Substance Abuse: {BHH CONSEQUENCES OF SUBSTANCE  ABUSE:22880}  Past Medical History:  Past Medical History:  Diagnosis Date   Asthma    Breast mass in female November 2012   left breast mass     Past Surgical History:  Procedure Laterality Date   IRRIGATION AND DEBRIDEMENT KNEE Left 06/11/2019   Procedure: IRRIGATION AND DEBRIDEMENT KNEE;  Surgeon: Samson Frederic, MD;  Location: [facility];  Service: Orthopedics;  Laterality: Left;   TONSILLECTOMY AND ADENOIDECTOMY  2002    Family Psychiatric History: Mom-ADHD with Adderall; takes mom's Adderall. Father and paternal uncle ADHD, Son ADHD, paternal aunt depression and bipolar, notes mother has undiagnosed mental issues   Family History: No family history on file.  Social History:   Social History   Socioeconomic History   Marital status: Single    Spouse name: Not on file   Number of children: Not on file   Years of education: Not on file   Highest education level: Not on file  Occupational History   Not on file  Tobacco Use   Smoking status: Every Day    Packs/day: .5    Types: Cigarettes   Smokeless tobacco: Never  Substance and Sexual Activity   Alcohol use: No   Drug use: No   Sexual activity: Not on file  Other Topics Concern   Not on file  Social History Narrative   Not on file   Social Determinants of Health   Financial Resource Strain: Not on file  Food Insecurity: Not on file  Transportation Needs: Not on file  Physical Activity: Not on file  Stress: Not on file  Social Connections: Not on file    Additional Social History: ***  Allergies:  No Known Allergies  Metabolic Disorder Labs: No results found  for: "HGBA1C", "MPG" No results found for: "PROLACTIN" No results found for: "CHOL", "TRIG", "HDL", "CHOLHDL", "VLDL", "LDLCALC" No results found for: "TSH"  Therapeutic Level Labs: No results found for: "LITHIUM" No results found for: "CBMZ" No results found for: "VALPROATE"  Current Medications: Current Outpatient Medications  Medication Sig  Dispense Refill   acetaminophen (TYLENOL) 500 MG tablet Take 2 tablets (1,000 mg total) by mouth every 6 (six) hours as needed. (Patient not taking: Reported on 07/02/2019) 30 tablet 0   atomoxetine (STRATTERA) 40 MG capsule Take 1 capsule (40 mg total) by mouth daily. 30 capsule 2   benzonatate (TESSALON) 100 MG capsule Take 100 mg by mouth 3 (three) times daily as needed for cough.     buPROPion (WELLBUTRIN XL) 150 MG 24 hr tablet TAKE 1 TABLET BY MOUTH EVERY MORNING 30 tablet 2   cephALEXin (KEFLEX) 500 MG capsule Take 2 capsules (1,000 mg total) by mouth 2 (two) times daily. (Patient not taking: Reported on 07/02/2019) 28 capsule 0   hydrOXYzine (ATARAX/VISTARIL) 50 MG tablet Take 50 mg by mouth 3 (three) times daily as needed.     ibuprofen (ADVIL,MOTRIN) 600 MG tablet Take 1 tablet (600 mg total) by mouth every 8 (eight) hours as needed for fever. (Patient not taking: Reported on 07/02/2019) 30 tablet 0   methadone (DOLOPHINE) 1 MG/1ML solution Take 40 mg by mouth daily.     methocarbamol (ROBAXIN) 500 MG tablet Take 500 mg by mouth every 8 (eight) hours as needed for muscle spasms.     nicotine (NICODERM CQ - DOSED IN MG/24 HOURS) 21 mg/24hr patch Place 1 patch (21 mg total) onto the skin daily. 28 patch 2   ondansetron (ZOFRAN ODT) 4 MG disintegrating tablet Take 1 tablet (4 mg total) by mouth every 4 (four) hours as needed for nausea or vomiting. (Patient not taking: Reported on 07/02/2019) 20 tablet 0   QUEtiapine (SEROQUEL) 50 MG tablet TAKE 1 TABLET BY MOUTH EVERY NIGHT AT BEDTIME(TK WITH THE 100MG  TABLET) 30 tablet 2   No current facility-administered medications for this visit.    Musculoskeletal: Strength & Muscle Tone: {desc; muscle tone:32375} Gait & Station: {PE GAIT ED WUJW:11914} Patient leans: {Patient Leans:21022755}  Psychiatric Specialty Exam: Review of Systems  There were no vitals taken for this visit.There is no height or weight on file to calculate BMI.  General  Appearance: {Appearance:22683}  Eye Contact:  {BHH EYE CONTACT:22684}  Speech:  {Speech:22685}  Volume:  {Volume (PAA):22686}  Mood:  {BHH MOOD:22306}  Affect:  {Affect (PAA):22687}  Thought Process:  {Thought Process (PAA):22688}  Orientation:  {BHH ORIENTATION (PAA):22689}  Thought Content:  {Thought Content:22690}  Suicidal Thoughts:  {ST/HT (PAA):22692}  Homicidal Thoughts:  {ST/HT (PAA):22692}  Memory:  {BHH MEMORY:22881}  Judgement:  {Judgement (PAA):22694}  Insight:  {Insight (PAA):22695}  Psychomotor Activity:  {Psychomotor (PAA):22696}  Concentration:  {Concentration:21399}  Recall:  {BHH GOOD/FAIR/POOR:22877}  Fund of Knowledge:{BHH GOOD/FAIR/POOR:22877}  Language: {BHH GOOD/FAIR/POOR:22877}  Akathisia:  {BHH YES OR NO:22294}  Handed:  {Handed:22697}  AIMS (if indicated):  {Desc; done/not:10129}  Assets:  {Assets (PAA):22698}  ADL's:  {BHH NWG'N:56213}  Cognition: {chl bhh cognition:304700322}  Sleep:  {BHH GOOD/FAIR/POOR:22877}   Screenings: Insurance account manager from 07/17/2022 in [facility]  PHQ-2 Total Score 4  PHQ-9 Total Score 12      Flowsheet Row Counselor from 07/17/2022 in [facility] ED to Hosp-Admission (Discharged) from 06/10/2019 in [facility] ENDOSCOPY  C-SSRS RISK CATEGORY No Risk High Risk       Assessment and Plan: ***  Collaboration of Care: {BH OP Collaboration of Care:21014065}  Patient/Guardian was advised Release of Information must be obtained prior to any record release in order to collaborate their care with an outside provider. Patient/Guardian was advised if they have not already done so to contact the registration department to sign all necessary forms in order for [place] to release information regarding their care.   Consent: Patient/Guardian gives verbal consent for treatment and assignment of benefits for services provided during this visit.  Patient/Guardian expressed understanding and agreed to proceed.   Lamar Sprinkles, MD 5/15/20244:27 PM

## 2022-07-19 MED ORDER — QUETIAPINE FUMARATE 100 MG PO TABS: 100.0000 mg | ORAL_TABLET | Freq: Every day | ORAL | 1 refills | Status: AC

## 2022-07-19 MED ORDER — QUETIAPINE FUMARATE 25 MG PO TABS: 25.0000 mg | ORAL_TABLET | Freq: Every day | ORAL | 1 refills | Status: AC | PRN

## 2022-07-20 ENCOUNTER — Encounter (HOSPITAL_COMMUNITY): Payer: Self-pay | Admitting: Internal Medicine

## 2022-07-26 ENCOUNTER — Telehealth (HOSPITAL_COMMUNITY): Payer: Self-pay

## 2022-07-26 NOTE — Telephone Encounter (Signed)
No response from pt.  Closed referral  

## 2022-08-08 ENCOUNTER — Other Ambulatory Visit (HOSPITAL_COMMUNITY): Payer: Self-pay | Admitting: Student

## 2022-08-08 NOTE — Progress Notes (Signed)
Patient called by this Clinical research associate. She confirmed that she is not taking Methadone, Nicotine patches, or Robaxin. Patient's husband passed away on Aug 28, 2022. She reported that she will not be returning to [facility] for care and will have all care managed through her PCP so as to avoid multiple appointments moving forward. She was advised of walk-in hours and provided encouragement. She was appreciative.    Lamar Sprinkles, MD PGY-2 08/08/2022  5:34 PM [facility] Department of Psychiatry

## 2022-08-09 NOTE — Addendum Note (Signed)
Addended by: Tia Masker on: 08/09/2022 01:12 PM   Modules accepted: Level of Service

## 2022-08-17 ENCOUNTER — Ambulatory Visit (HOSPITAL_COMMUNITY): Payer: Self-pay | Admitting: Mental Health

## 2022-08-22 ENCOUNTER — Encounter (HOSPITAL_COMMUNITY): Payer: Self-pay | Admitting: Internal Medicine

## 2022-08-29 ENCOUNTER — Encounter (HOSPITAL_COMMUNITY): Payer: Medicaid Other | Admitting: Student

## 2022-11-28 ENCOUNTER — Encounter (HOSPITAL_COMMUNITY): Payer: Self-pay | Admitting: Internal Medicine
